# Patient Record
Sex: Male | Born: 1970
Health system: Southern US, Community
[De-identification: ages and names within clinical notes are randomized; demographics above are authoritative.]

## PROBLEM LIST (undated history)

## (undated) DIAGNOSIS — G4730 Sleep apnea, unspecified: Secondary | ICD-10-CM

## (undated) DIAGNOSIS — K279 Peptic ulcer, site unspecified, unspecified as acute or chronic, without hemorrhage or perforation: Secondary | ICD-10-CM

## (undated) DIAGNOSIS — I10 Essential (primary) hypertension: Secondary | ICD-10-CM

## (undated) DIAGNOSIS — E785 Hyperlipidemia, unspecified: Secondary | ICD-10-CM

## (undated) DIAGNOSIS — K469 Unspecified abdominal hernia without obstruction or gangrene: Secondary | ICD-10-CM

## (undated) HISTORY — DX: Hyperlipidemia, unspecified: E78.5

## (undated) HISTORY — DX: Essential (primary) hypertension: I10

## (undated) HISTORY — PX: OTHER SURGICAL HISTORY: SHX169

## (undated) HISTORY — DX: Sleep apnea, unspecified: G47.30

## (undated) HISTORY — DX: Peptic ulcer, site unspecified, unspecified as acute or chronic, without hemorrhage or perforation: K27.9

---

## 2008-02-18 ENCOUNTER — Emergency Department (HOSPITAL_COMMUNITY): Admission: EM | Admit: 2008-02-18 | Discharge: 2008-02-18 | Payer: Self-pay | Admitting: Emergency Medicine

## 2014-11-08 ENCOUNTER — Emergency Department (HOSPITAL_COMMUNITY)
Admission: EM | Admit: 2014-11-08 | Discharge: 2014-11-08 | Disposition: A | Discharge status: Home / Self Care | Payer: Self-pay | Attending: Emergency Medicine | Admitting: Emergency Medicine

## 2014-11-08 ENCOUNTER — Emergency Department (HOSPITAL_COMMUNITY): Payer: Self-pay

## 2014-11-08 ENCOUNTER — Encounter (HOSPITAL_COMMUNITY): Payer: Self-pay | Admitting: *Deleted

## 2014-11-08 DIAGNOSIS — R519 Headache, unspecified: Secondary | ICD-10-CM

## 2014-11-08 DIAGNOSIS — H539 Unspecified visual disturbance: Secondary | ICD-10-CM | POA: Insufficient documentation

## 2014-11-08 DIAGNOSIS — Z720 Tobacco use: Secondary | ICD-10-CM | POA: Insufficient documentation

## 2014-11-08 DIAGNOSIS — R51 Headache: Secondary | ICD-10-CM | POA: Insufficient documentation

## 2014-11-08 HISTORY — DX: Unspecified abdominal hernia without obstruction or gangrene: K46.9

## 2014-11-08 LAB — COMPREHENSIVE METABOLIC PANEL
ALBUMIN: 3.6 g/dL (ref 3.5–5.0)
ALK PHOS: 60 U/L (ref 38–126)
ALT: 29 U/L (ref 17–63)
AST: 23 U/L (ref 15–41)
Anion gap: 6 (ref 5–15)
BUN: 13 mg/dL (ref 6–20)
CALCIUM: 9.5 mg/dL (ref 8.9–10.3)
CHLORIDE: 105 mmol/L (ref 101–111)
CO2: 28 mmol/L (ref 22–32)
CREATININE: 0.99 mg/dL (ref 0.61–1.24)
GFR calc Af Amer: >60 mL/min (ref >60)
GFR calc non Af Amer: >60 mL/min (ref >60)
GLUCOSE: 104 mg/dL — AB (ref 65–99)
Potassium: 4.4 mmol/L (ref 3.5–5.1)
SODIUM: 139 mmol/L (ref 135–145)
Total Bilirubin: 0.4 mg/dL (ref 0.3–1.2)
Total Protein: 6.3 g/dL — ABNORMAL LOW (ref 6.5–8.1)

## 2014-11-08 LAB — CBC
HCT: 44.7 % (ref 39.0–52.0)
Hemoglobin: 14.9 g/dL (ref 13.0–17.0)
MCH: 32.3 pg (ref 26.0–34.0)
MCHC: 33.3 g/dL (ref 30.0–36.0)
MCV: 96.8 fL (ref 78.0–100.0)
PLATELETS: 162 10*3/uL (ref 150–400)
RBC: 4.62 MIL/uL (ref 4.22–5.81)
RDW: 12.4 % (ref 11.5–15.5)
WBC: 8.9 10*3/uL (ref 4.0–10.5)

## 2014-11-08 LAB — DIFFERENTIAL
BASOS ABS: 0 10*3/uL (ref 0.0–0.1)
BASOS PCT: 0 % (ref 0–1)
Eosinophils Absolute: 0.1 10*3/uL (ref 0.0–0.7)
Eosinophils Relative: 1 % (ref 0–5)
LYMPHS PCT: 31 % (ref 12–46)
Lymphs Abs: 2.8 10*3/uL (ref 0.7–4.0)
Monocytes Absolute: 0.7 10*3/uL (ref 0.1–1.0)
Monocytes Relative: 8 % (ref 3–12)
NEUTROS ABS: 5.3 10*3/uL (ref 1.7–7.7)
Neutrophils Relative %: 60 % (ref 43–77)

## 2014-11-08 LAB — I-STAT TROPONIN, ED: Troponin i, poc: 0.01 ng/mL (ref 0.00–0.08)

## 2014-11-08 LAB — APTT: APTT: 33 s (ref 24–37)

## 2014-11-08 LAB — PROTIME-INR
INR: 0.97 (ref 0.00–1.49)
PROTHROMBIN TIME: 13.1 s (ref 11.6–15.2)

## 2014-11-08 MED ORDER — DIPHENHYDRAMINE HCL 50 MG/ML IJ SOLN
25.0000 mg | Freq: Once | INTRAMUSCULAR | Status: AC
  Administered 2014-11-08: 25 mg via INTRAVENOUS
  Filled 2014-11-08: qty 1

## 2014-11-08 MED ORDER — METOCLOPRAMIDE HCL 5 MG/ML IJ SOLN
10.0000 mg | Freq: Once | INTRAMUSCULAR | Status: AC
  Administered 2014-11-08: 10 mg via INTRAVENOUS
  Filled 2014-11-08: qty 2

## 2014-11-08 MED ORDER — SODIUM CHLORIDE 0.9 % IV BOLUS (SEPSIS)
1000.0000 mL | Freq: Once | INTRAVENOUS | Status: AC
  Administered 2014-11-08: 1000 mL via INTRAVENOUS

## 2014-11-08 MED ORDER — TETRACAINE HCL 0.5 % OP SOLN
1.0000 [drp] | Freq: Once | OPHTHALMIC | Status: AC
  Administered 2014-11-08: 1 [drp] via OPHTHALMIC
  Filled 2014-11-08: qty 2

## 2014-11-08 NOTE — ED Notes (Signed)
Pt was eating and then onset of pain and "sensation something popped" behind his right eye. Hx of recent headaches. Having mild nausea. Pupils equal and reactive, grips equal, no neuro deficits noted.

## 2014-11-08 NOTE — Discharge Instructions (Signed)
Migraine Headache °A migraine headache is an intense, throbbing pain on one or both sides of your head. A migraine can last for 30 minutes to several hours. °CAUSES  °The exact cause of a migraine headache is not always known. However, a migraine may be caused when nerves in the brain become irritated and release chemicals that cause inflammation. This causes pain. °Certain things may also trigger migraines, such as: °· Alcohol. °· Smoking. °· Stress. °· Menstruation. °· Aged cheeses. °· Foods or drinks that contain nitrates, glutamate, aspartame, or tyramine. °· Lack of sleep. °· Chocolate. °· Caffeine. °· Hunger. °· Physical exertion. °· Fatigue. °· Medicines used to treat chest pain (nitroglycerine), birth control pills, estrogen, and some blood pressure medicines. °SIGNS AND SYMPTOMS °· Pain on one or both sides of your head. °· Pulsating or throbbing pain. °· Severe pain that prevents daily activities. °· Pain that is aggravated by any physical activity. °· Nausea, vomiting, or both. °· Dizziness. °· Pain with exposure to bright lights, loud noises, or activity. °· General sensitivity to bright lights, loud noises, or smells. °Before you get a migraine, you may get warning signs that a migraine is coming (aura). An aura may include: °· Seeing flashing lights. °· Seeing bright spots, halos, or zigzag lines. °· Having tunnel vision or blurred vision. °· Having feelings of numbness or tingling. °· Having trouble talking. °· Having muscle weakness. °DIAGNOSIS  °A migraine headache is often diagnosed based on: °· Symptoms. °· Physical exam. °· A CT scan or MRI of your head. These imaging tests cannot diagnose migraines, but they can help rule out other causes of headaches. °TREATMENT °Medicines may be given for pain and nausea. Medicines can also be given to help prevent recurrent migraines.  °HOME CARE INSTRUCTIONS °· Only take over-the-counter or prescription medicines for pain or discomfort as directed by your  health care provider. The use of long-term narcotics is not recommended. °· Lie down in a dark, quiet room when you have a migraine. °· Keep a journal to find out what may trigger your migraine headaches. For example, write down: °· What you eat and drink. °· How much sleep you get. °· Any change to your diet or medicines. °· Limit alcohol consumption. °· Quit smoking if you smoke. °· Get 7-9 hours of sleep, or as recommended by your health care provider. °· Limit stress. °· Keep lights dim if bright lights bother you and make your migraines worse. °SEEK IMMEDIATE MEDICAL CARE IF:  °· Your migraine becomes severe. °· You have a fever. °· You have a stiff neck. °· You have vision loss. °· You have muscular weakness or loss of muscle control. °· You start losing your balance or have trouble walking. °· You feel faint or pass out. °· You have severe symptoms that are different from your first symptoms. °MAKE SURE YOU:  °· Understand these instructions. °· Will watch your condition. °· Will get help right away if you are not doing well or get worse. °Document Released: 02/14/2005 Document Revised: 07/01/2013 Document Reviewed: 10/22/2012 °ExitCare® Patient Information ©2015 [employer]. This information is not intended to replace advice given to you by your health care provider. Make sure you discuss any questions you have with your health care provider. ° °General Headache Without Cause °A headache is pain or discomfort felt around the head or neck area. The specific cause of a headache may not be found. There are many causes and types of headaches. A few common ones   Tension headaches. Migraine headaches. Cluster headaches. Chronic daily headaches. HOME CARE INSTRUCTIONS  Keep all follow-up appointments with your caregiver or any specialist referral. Only take over-the-counter or prescription medicines for pain or discomfort as directed by your caregiver. Lie down in a dark, quiet room when you have a  headache. Keep a headache journal to find out what may trigger your migraine headaches. For example, write down: What you eat and drink. How much sleep you get. Any change to your diet or medicines. Try massage or other relaxation techniques. Put ice packs or heat on the head and neck. Use these 3 to 4 times per day for 15 to 20 minutes each time, or as needed. Limit stress. Sit up straight, and do not tense your muscles. Quit smoking if you smoke. Limit alcohol use. Decrease the amount of caffeine you drink, or stop drinking caffeine. Eat and sleep on a regular schedule. Get 7 to 9 hours of sleep, or as recommended by your caregiver. Keep lights dim if bright lights bother you and make your headaches worse. SEEK MEDICAL CARE IF:  You have problems with the medicines you were prescribed. Your medicines are not working. You have a change from the usual headache. You have nausea or vomiting. SEEK IMMEDIATE MEDICAL CARE IF:  Your headache becomes severe. You have a fever. You have a stiff neck. You have loss of vision. You have muscular weakness or loss of muscle control. You start losing your balance or have trouble walking. You feel faint or pass out. You have severe symptoms that are different from your first symptoms. MAKE SURE YOU:  Understand these instructions. Will watch your condition. Will get help right away if you are not doing well or get worse. Document Released: 02/14/2005 Document Revised: 05/09/2011 Document Reviewed: 03/02/2011 [facility] Patient Information 2015 [place], [place]. This information is not intended to replace advice given to you by your health care provider. Make sure you discuss any questions you have with your health care provider.

## 2014-11-08 NOTE — ED Provider Notes (Signed)
CSN: [id number]     Arrival date & time 11/08/14  1345 History   First MD Initiated Contact with Patient 11/08/14 1503     Chief Complaint  Patient presents with  . Headache   Omar Ward is a 44 y.o. male with a history of tobacco abuse who presents to the ED complaining of a sudden onset right sided headache today around 1 hour PTA. The patient reports he was sitting down to eat lunch when he had a sudden onset right sided headache that he rates at a 8/10 and describes as "Solid." He reports his pain is worse behind his eye and that his eye and the whole right side of his head hurts. He complains of worsening blurry vision since the onset of the headache. He reports feeling generally fatigued. He has taken nothing for treatment today. He denies history of migraines. He denies frequent headaches recently. He denies fevers, chest pain, shortness of breath, palpitations, numbness, tingling, weakness, no abdominal pain, nausea, vomiting, neck pain, neck stiffness, ear pain, sore throat or rashes.  (Consider location/radiation/quality/duration/timing/severity/associated sxs/prior Treatment) HPI  Past Medical History  Diagnosis Date  . Hernia, abdominal    History reviewed. No pertinent past surgical history. History reviewed. No pertinent family history. Social History  Substance Use Topics  . Smoking status: Current Every Day Smoker    Types: Cigarettes  . Smokeless tobacco: None  . Alcohol Use: No    Review of Systems  Constitutional: Negative for fever.  HENT: Negative for congestion, ear pain, facial swelling and sore throat.   Eyes: Positive for pain and visual disturbance. Negative for photophobia, discharge and redness.  Respiratory: Negative for cough, shortness of breath and wheezing.   Cardiovascular: Negative for chest pain and palpitations.  Gastrointestinal: Negative for nausea, vomiting, abdominal pain and diarrhea.  Genitourinary: Negative for dysuria.  Musculoskeletal:  Negative for back pain, neck pain and neck stiffness.  Skin: Negative for rash.  Neurological: Positive for headaches. Negative for dizziness, syncope, facial asymmetry, speech difficulty, weakness, light-headedness and numbness.      Allergies  Codeine  Home Medications   Prior to Admission medications   Medication Sig Start Date End Date Taking? Authorizing Provider  acetaminophen-codeine (TYLENOL #3) 300-30 MG per tablet Take 1 tablet by mouth every 4 (four) hours as needed for moderate pain.   Yes Historical Provider, MD  Aspirin-Acetaminophen-Caffeine 260-130-16 MG TABS Take 1 packet by mouth daily as needed (for headache).   Yes Historical Provider, MD   BP 117/89 mmHg  Pulse 49  Temp(Src) 97.8 F (36.6 C) (Oral)  Resp 14  Ht  (1.753 m)  Wt 180 lb (81.647 kg)  BMI 26.57 kg/m2  SpO2 94% Physical Exam  Constitutional: He is oriented to person, place, and time. He appears well-developed and well-nourished. No distress.  Nontoxic appearing.  HENT:  Head: Normocephalic and atraumatic.  Mouth/Throat: Oropharynx is clear and moist.  Eyes: Conjunctivae and EOM are normal. Pupils are equal, round, and reactive to light. Right eye exhibits no discharge. Left eye exhibits no discharge.  Slit lamp exam:      The right eye shows no anterior chamber bulge.       The left eye shows no anterior chamber bulge.  Eyes feel soft bilaterally. Lids appear normal. No eye discharge. Vision is grossly intact.  Eyes anesthetized using tetracaine and pressures checked using Tono-Pen. Right pressures: 27, 31, 18 Left pressures: 27, 27, 25  Neck: Normal range of motion. Neck supple. No  JVD present. No tracheal deviation present.  Normal neck range of motion. No meningeal signs.  Cardiovascular: Normal rate, regular rhythm, normal heart sounds and intact distal pulses.  Exam reveals no gallop and no friction rub.   No murmur heard. Bilateral radial pulses are intact.   Pulmonary/Chest:  Effort normal and breath sounds normal. No respiratory distress. He has no wheezes. He has no rales.  Lungs are clear to auscultation bilaterally.  Abdominal: Soft. There is no tenderness.  Musculoskeletal: Normal range of motion. He exhibits no edema or tenderness.  The patient is able to ambulate without difficulty or assistance. No lower extremity edema or tenderness. He has 5 out of 5 strength in his bilateral upper and lower extremities.  Lymphadenopathy:    He has no cervical adenopathy.  Neurological: He is alert and oriented to person, place, and time. No cranial nerve deficit. Coordination normal.  The patient is alert and oriented 3. Cranial nerves are intact. Sensation intact his bilateral upper and lower extremities. EOMs are intact bilaterally. Vision is grossly intact. Speech is clear and coherent. Normal gait. Good and equal grip strength bilaterally. No pronator drift.  Skin: Skin is warm and dry. No rash noted. He is not diaphoretic. No erythema. No pallor.  Psychiatric: He has a normal mood and affect. His behavior is normal.  Nursing note and vitals reviewed.   ED Course  Procedures (including critical care time) Labs Review Labs Reviewed  COMPREHENSIVE METABOLIC PANEL - Abnormal; Notable for the following:    Glucose, Bld 104 (*)    Total Protein 6.3 (*)    All other components within normal limits  PROTIME-INR  APTT  CBC  DIFFERENTIAL  I-STAT TROPOININ, ED    Imaging Review Ct Head Wo Contrast  11/08/2014   CLINICAL DATA:  Sudden onset of headache behind RIGHT eye. Mild nausea.  EXAM: CT HEAD WITHOUT CONTRAST  TECHNIQUE: Contiguous axial images were obtained from the base of the skull through the vertex without intravenous contrast.  COMPARISON:  None.  FINDINGS: No evidence for acute infarction, hemorrhage, mass lesion, hydrocephalus, or extra-axial fluid. No atrophy or white matter disease. Intact calvarium. No acute sinus or mastoid disease.  IMPRESSION:  Negative.   Electronically Signed   By: Elsie Stain M.D.   On: 11/08/2014 14:53   I have personally reviewed and evaluated these images and lab results as part of my medical decision-making.   EKG Interpretation None      Filed Vitals:   11/08/14 1800 11/08/14 1900 11/08/14 1901 11/08/14 2100  BP: 145/94 145/98  117/89  Pulse: 65 64  49  Temp:   97.8 F (36.6 C)   TempSrc:      Resp: 16 12  14   Height:      Weight:      SpO2: 95% 97%  94%    MDM   Meds given in ED:  Medications  sodium chloride 0.9 % bolus 1,000 mL (0 mLs Intravenous Stopped 11/08/14 2105)  metoCLOPramide (REGLAN) injection 10 mg (10 mg Intravenous Given 11/08/14 1904)  diphenhydrAMINE (BENADRYL) injection 25 mg (25 mg Intravenous Given 11/08/14 1903)  tetracaine (PONTOCAINE) 0.5 % ophthalmic solution 1 drop (1 drop Both Eyes Given by Other 11/08/14 2104)    New Prescriptions   No medications on file    Final diagnoses:  Bad headache   This is a 44 y.o. male with a history of tobacco abuse who presents to the ED complaining of a sudden onset right sided  headache today around 1 hour PTA. The patient reports he was sitting down to eat lunch when he had a sudden onset right sided headache that he rates at a 8/10 and describes as "Solid." He reports his pain is worse behind his eye and that his eye and the whole right side of his head hurts.   CT head was unremarkable. CBC and CMP are unremarkable. Intraocular pressures were checked using Tono-Pen and were unremarkable and not concerning for glaucoma. Pt HA treated and improved while in ED with migraine cocktail. He reports his symptoms have completely resolved prior to discharge.  Presentation is not concerning for [facility], ICH, Meningitis, or temporal arteritis. Pt is afebrile with no focal neuro deficits, nuchal rigidity. Pt is to follow up with PCP and neurology to discuss prophylactic medication. I advised the patient to follow-up with their primary care provider  this week. I advised the patient to return to the emergency department with new or worsening symptoms or new concerns. The patient verbalized understanding and agreement with plan.       Everlene Farrier, PA-C 11/08/14 2154  Linwood Dibbles, MD 11/09/14 [id number]

## 2017-02-15 ENCOUNTER — Other Ambulatory Visit: Payer: Self-pay

## 2017-02-15 ENCOUNTER — Encounter: Payer: Self-pay | Admitting: Family Medicine

## 2017-02-15 ENCOUNTER — Ambulatory Visit (INDEPENDENT_AMBULATORY_CARE_PROVIDER_SITE_OTHER): Payer: 59 | Admitting: Family Medicine

## 2017-02-15 ENCOUNTER — Ambulatory Visit: Payer: Self-pay | Admitting: Family Medicine

## 2017-02-15 DIAGNOSIS — F17200 Nicotine dependence, unspecified, uncomplicated: Secondary | ICD-10-CM

## 2017-02-15 DIAGNOSIS — Z13220 Encounter for screening for lipoid disorders: Secondary | ICD-10-CM | POA: Diagnosis not present

## 2017-02-15 DIAGNOSIS — I10 Essential (primary) hypertension: Secondary | ICD-10-CM | POA: Diagnosis not present

## 2017-02-15 MED ORDER — AMLODIPINE BESYLATE 10 MG PO TABS: 10.0000 mg | ORAL_TABLET | Freq: Every day | ORAL | 0 refills | Status: DC

## 2017-02-15 MED FILL — AMLODIPINE BESYLATE 10 MG T: 10 | 90 days supply | Qty: 90 | Fill #0

## 2017-02-15 NOTE — Assessment & Plan Note (Signed)
See after visit summary. 

## 2017-02-15 NOTE — Patient Instructions (Addendum)
Good to see you today!  Thanks for coming in.  Keep taking amlodipine 10 mg every day.  If you have lightheadness or feel dizzy then call us  Monitor your blood pressure aim is for  < 140/90 best is 130/80    I will call you if your tests are not good.  Otherwise I will send you a letter.  If you do not hear from me with in 2 weeks please call our office.     Homework  Make an appointment to see an eye doctor  Consider stopping smoking - substitute   Watch weight - cut back on unhealthy foods   Come back in 3 months for a blood pressure and weight check

## 2017-02-15 NOTE — Progress Notes (Signed)
hSubjective  Here to establish care.  Generally feeling well  Diagnosed with Hypertension at employment physical with [employer] last month.  Treated by practicioner in [place] started on Amlodipine 10 mg daily.  Has been feeling well recently.  No chest pain or shortness of breath or lightheadedness. Does have occsl headaches.  Also feels his vision is not as good as used to be over the last several years  Patient reports no hearing changes,anorexia, weight change, fever ,adenopathy, persistant / recurrent hoarseness, swallowing issues, chest pain, edema,persistant / recurrent cough, hemoptysis, dyspnea(rest, exertional, paroxysmal nocturnal), gastrointestinal  bleeding (melena, rectal bleeding), abdominal pain, excessive heart burn, GU symptoms(dysuria, hematuria, pyuria, voiding/incontinence  Issues) syncope, focal weakness, severe memory loss, concerning skin lesions, depression, anxiety, abnormal bruising/bleeding, major joint swelling.    PMFSH - see relevent areas in Epic    Objective Vital Signs reviewed Ears:  External ear exam shows no significant lesions or deformities.  Otoscopic examination reveals clear canals, tympanic membranes are intact bilaterally without bulging, retraction, inflammation or discharge. Hearing is grossly normal bilaterall Neck:  No deformities, thyromegaly, masses, or tenderness noted.   Supple with full range of motion without pain. Skin:  Intact without suspicious lesions or rashes Heart - Regular rate and rhythm.  No murmurs, gallops or rubs.    Lungs:  Normal respiratory effort, chest expands symmetrically. Lungs are clear to auscultation, no crackles or wheezes. Abdomen: soft and non-tender without masses, organomegaly or hernias noted.  No guarding or rebound Extremities:  No cyanosis, edema, or deformity noted with good range of motion of all major joints.   Mouth - no lesions, mucous membranes are moist, no decaying teeth   Eye - Pupils Equal Round Reactive  to light, Extraocular movements intact,  Conjunctiva without redness or discharge     Assessments/Plans  Tobacco use disorder See after visit summary   Essential hypertension At goal continue current medication - check labs   Normal Physical - see after visit summary

## 2017-02-15 NOTE — Assessment & Plan Note (Signed)
At goal continue current medication - check labs

## 2017-02-16 ENCOUNTER — Encounter: Payer: Self-pay | Admitting: Family Medicine

## 2017-02-16 LAB — LIPID PANEL
CHOL/HDL RATIO: 5.7 ratio — AB (ref 0.0–5.0)
Cholesterol, Total: 222 mg/dL — ABNORMAL HIGH (ref 100–199)
HDL: 39 mg/dL — AB (ref >39)
TRIGLYCERIDES: 447 mg/dL — AB (ref 0–149)

## 2017-02-16 LAB — CMP14+EGFR
ALK PHOS: 83 IU/L (ref 39–117)
ALT: 24 IU/L (ref 0–44)
AST: 24 IU/L (ref 0–40)
Albumin/Globulin Ratio: 2 (ref 1.2–2.2)
Albumin: 4.5 g/dL (ref 3.5–5.5)
BUN / CREAT RATIO: 12 (ref 9–20)
BUN: 13 mg/dL (ref 6–24)
CHLORIDE: 100 mmol/L (ref 96–106)
CO2: 24 mmol/L (ref 20–29)
Calcium: 9.5 mg/dL (ref 8.7–10.2)
Creatinine, Ser: 1.05 mg/dL (ref 0.76–1.27)
GFR calc Af Amer: 97 mL/min/{1.73_m2} (ref >59)
GFR calc non Af Amer: 84 mL/min/{1.73_m2} (ref >59)
GLUCOSE: 87 mg/dL (ref 65–99)
Globulin, Total: 2.2 g/dL (ref 1.5–4.5)
Potassium: 4.5 mmol/L (ref 3.5–5.2)
Sodium: 140 mmol/L (ref 134–144)
Total Protein: 6.7 g/dL (ref 6.0–8.5)

## 2017-02-16 LAB — CBC
HEMATOCRIT: 41.4 % (ref 37.5–51.0)
HEMOGLOBIN: 14 g/dL (ref 13.0–17.7)
MCH: 32.9 pg (ref 26.6–33.0)
MCHC: 33.8 g/dL (ref 31.5–35.7)
MCV: 97 fL (ref 79–97)
Platelets: 203 10*3/uL (ref 150–379)
RBC: 4.26 x10E6/uL (ref 4.14–5.80)
RDW: 13.2 % (ref 12.3–15.4)
WBC: 8.3 10*3/uL (ref 3.4–10.8)

## 2017-06-28 ENCOUNTER — Ambulatory Visit (INDEPENDENT_AMBULATORY_CARE_PROVIDER_SITE_OTHER): Payer: 59 | Admitting: Family Medicine

## 2017-06-28 ENCOUNTER — Encounter: Payer: Self-pay | Admitting: Family Medicine

## 2017-06-28 DIAGNOSIS — F17200 Nicotine dependence, unspecified, uncomplicated: Secondary | ICD-10-CM | POA: Diagnosis not present

## 2017-06-28 DIAGNOSIS — I10 Essential (primary) hypertension: Secondary | ICD-10-CM | POA: Diagnosis not present

## 2017-06-28 MED ORDER — AMLODIPINE BESYLATE 10 MG PO TABS: 10.0000 mg | ORAL_TABLET | Freq: Every day | ORAL | 1 refills | Status: DC

## 2017-06-28 MED FILL — AMLODIPINE BESYLATE 10 MG T: 10 | 90 days supply | Qty: 90 | Fill #0

## 2017-06-28 NOTE — Progress Notes (Signed)
Subjective  Omar Ward is a 47 y.o. male is presenting with the following  HYPERTENSION Disease Monitoring  Home BP Monitoring (Severity) Was 180/110 yesterday has been off medications for 4 days ran out Symptoms - Chest pain- no    Dyspnea- no Medications (Modifying factors) Compliance-  Was daily till ran out. Lightheadedness-  mild  Edema- some at the end of the day.  No better when stopped the amlodipine Timing - continuous  Duration - years ROS - See HPI  PMH Lab Review   Potassium  Date Value Ref Range Status  02/15/2017 4.5 3.5 - 5.2 mmol/L Final   Sodium  Date Value Ref Range Status  02/15/2017 140 134 - 144 mmol/L Final   Creatinine, Ser  Date Value Ref Range Status  02/15/2017 1.05 0.76 - 1.27 mg/dL Final       TOBACCO Has cut down to 1/2 ppd.  Aim is to stop completely but no quit date.  Lots of stress at home taking care of mother and aunt and working 3rd shift   Chief Complaint noted Review of Symptoms - see HPI PMH - Smoking status noted.    FH - Mom has diabetes  Objective Vital Signs reviewed BP 140/80 (BP Location: Right Arm, Patient Position: Sitting, Cuff Size: Normal)   Pulse 84   Temp 99.2 F (37.3 C) (Oral)   Ht  (1.753 m)   Wt 218 lb 9.6 oz (99.2 kg)   SpO2 97%   BMI 32.28 kg/m  Heart - Regular rate and rhythm.  No murmurs, gallops or rubs.    Lungs:  Normal respiratory effort, chest expands symmetrically. Lungs are clear to auscultation, no crackles or wheezes. Extrem - trace edema at ankles (worked last PM)  Assessments/Plans  See after visit summary for details of patient instuctions  Tobacco use disorder Improved despite ongoing stressors   Essential hypertension Not at goal due to not taking medication.  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

## 2017-06-28 NOTE — Assessment & Plan Note (Signed)
Improved despite ongoing stressors

## 2017-06-28 NOTE — Assessment & Plan Note (Signed)
Not at goal due to not taking medication.  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

## 2017-06-28 NOTE — Patient Instructions (Signed)
Good to see you today!  Thanks for coming in.  Your blood pressure should be below 140/90 goal is 135/80  Take the amlodipine 10 mg every day.  You should feel better in 1-2 days and your blood pressure should be better if it does not let me know.  Congrats on the weight loss and the smoking cessation  Come back in 6 weeks for blood pressure check

## 2017-10-23 MED FILL — AMLODIPINE BESYLATE 10 MG T: 10 | 90 days supply | Qty: 90 | Fill #1

## 2018-02-19 ENCOUNTER — Other Ambulatory Visit: Payer: Self-pay | Admitting: Family Medicine

## 2018-02-19 DIAGNOSIS — I10 Essential (primary) hypertension: Secondary | ICD-10-CM

## 2018-02-19 MED FILL — AMLODIPINE BESYLATE 10 MG T: 10 | 90 days supply | Qty: 90 | Fill #0

## 2018-02-27 DIAGNOSIS — M25561 Pain in right knee: Secondary | ICD-10-CM | POA: Diagnosis not present

## 2018-02-27 MED FILL — MELOXICAM 15 MG TABLET: 15 | 30 days supply | Qty: 30 | Fill #0

## 2018-04-03 ENCOUNTER — Other Ambulatory Visit: Payer: Self-pay

## 2018-04-03 ENCOUNTER — Encounter: Payer: Self-pay | Admitting: Family Medicine

## 2018-04-03 ENCOUNTER — Ambulatory Visit (INDEPENDENT_AMBULATORY_CARE_PROVIDER_SITE_OTHER): Payer: 59 | Admitting: Family Medicine

## 2018-04-03 DIAGNOSIS — I10 Essential (primary) hypertension: Secondary | ICD-10-CM

## 2018-04-03 DIAGNOSIS — F17200 Nicotine dependence, unspecified, uncomplicated: Secondary | ICD-10-CM

## 2018-04-03 DIAGNOSIS — L237 Allergic contact dermatitis due to plants, except food: Secondary | ICD-10-CM | POA: Diagnosis not present

## 2018-04-03 MED ORDER — PREDNISONE 10 MG PO TABS: ORAL_TABLET | ORAL | 0 refills | Status: DC

## 2018-04-03 MED ORDER — TRIAMCINOLONE ACETONIDE 0.5 % EX OINT: 1.0000 "application " | TOPICAL_OINTMENT | Freq: Two times a day (BID) | CUTANEOUS | 2 refills | Status: DC

## 2018-04-03 MED FILL — MELOXICAM 15 MG TABLET: 15 | 30 days supply | Qty: 30 | Fill #1

## 2018-04-03 MED FILL — TRIAMCINOLONE 0.5% OINTMENT: 0.5 | 30 days supply | Qty: 30 | Fill #0

## 2018-04-03 MED FILL — predniSONE 10 MG TABS: 10 | 12 days supply | Qty: 60 | Fill #0

## 2018-04-03 NOTE — Assessment & Plan Note (Signed)
Typical contact rash - topical and oral steroids

## 2018-04-03 NOTE — Patient Instructions (Signed)
Good to see you today!  Thanks for coming in.  Take the prednisone until gone.  If the rash comes back then call me Use the ointment twice a day  Come in for blood tests - nothing but water for 8 hours before  I will call you if your tests are not good.  Otherwise I will send you a letter.  If you do not hear from me with in 2 weeks please call our office.     Consider following up for smoking cessation  Contact [employer] employee classes  Consider regular sleep schedule

## 2018-04-03 NOTE — Assessment & Plan Note (Signed)
Discussed approaches.  See after visit summary

## 2018-04-03 NOTE — Progress Notes (Signed)
Subjective  TAURINO FIGEROA is a 48 y.o. male is presenting with the following  HYPERTENSION Disease Monitoring  Home BP Monitoring (Severity) not checking Symptoms - Chest pain- no    Dyspnea- no Medications (Modifying factors) Compliance-  Daily amlodipine. Lightheadedness-  no  Edema- no Timing - continuous  Duration - years ROS - See HPI  TOBACCO USE Severity Smokes about 1/2 ppd Modifying Factors - reasons they smoke - habit stress Timing - when do they smoke various times Duration - years Symptoms - Shortness of breath - not severe     Cough - occasional   RASH Has recurrent PI and was exposed moving wood recently.  Steroids usually help.  No shortness of breath or oral lesions   PMH Lab Review   Potassium  Date Value Ref Range Status  02/15/2017 4.5 3.5 - 5.2 mmol/L Final   Sodium  Date Value Ref Range Status  02/15/2017 140 134 - 144 mmol/L Final   Creatinine, Ser  Date Value Ref Range Status  02/15/2017 1.05 0.76 - 1.27 mg/dL Final         Chief Complaint noted Review of Symptoms - see HPI PMH - Smoking status noted.    Objective Vital Signs reviewed Pulse 61   Temp 98.7 F (37.1 C) (Oral)   Ht 5\' 9"  (1.753 m)   Wt 224 lb 12.8 oz (102 kg)   SpO2 96%   BMI 33.20 kg/m  Psych:  Cognition and judgment appear intact. Alert, communicative  and cooperative with normal attention span and concentration. No apparent delusions, illusions, hallucinations Skin - typical diffuse vesicular rash in neck and wrists  Assessments/Plans  See after visit summary for details of patient instuctions  Poison ivy Typical contact rash - topical and oral steroids   Essential hypertension Well controlled.  Check labs   Tobacco use disorder Discussed approaches.  See after visit summary

## 2018-04-03 NOTE — Assessment & Plan Note (Signed)
Well controlled.  Check labs  

## 2018-06-07 ENCOUNTER — Other Ambulatory Visit: Payer: Self-pay | Admitting: Family Medicine

## 2018-06-07 DIAGNOSIS — I10 Essential (primary) hypertension: Secondary | ICD-10-CM

## 2018-06-07 MED FILL — AMLODIPINE BESYLATE 10 MG T: 10 | 90 days supply | Qty: 90 | Fill #0

## 2018-10-09 ENCOUNTER — Other Ambulatory Visit: Payer: Self-pay | Admitting: Family Medicine

## 2018-10-09 ENCOUNTER — Encounter: Payer: Self-pay | Admitting: Family Medicine

## 2018-10-09 ENCOUNTER — Ambulatory Visit: Payer: 59 | Admitting: Family Medicine

## 2018-10-09 ENCOUNTER — Other Ambulatory Visit: Payer: Self-pay

## 2018-10-09 DIAGNOSIS — G4733 Obstructive sleep apnea (adult) (pediatric): Secondary | ICD-10-CM | POA: Insufficient documentation

## 2018-10-09 DIAGNOSIS — I10 Essential (primary) hypertension: Secondary | ICD-10-CM

## 2018-10-09 DIAGNOSIS — Z9989 Dependence on other enabling machines and devices: Secondary | ICD-10-CM | POA: Insufficient documentation

## 2018-10-09 DIAGNOSIS — R0683 Snoring: Secondary | ICD-10-CM

## 2018-10-09 DIAGNOSIS — F339 Major depressive disorder, recurrent, unspecified: Secondary | ICD-10-CM | POA: Diagnosis not present

## 2018-10-09 DIAGNOSIS — E785 Hyperlipidemia, unspecified: Secondary | ICD-10-CM | POA: Insufficient documentation

## 2018-10-09 DIAGNOSIS — G4730 Sleep apnea, unspecified: Secondary | ICD-10-CM | POA: Insufficient documentation

## 2018-10-09 MED ORDER — AMLODIPINE BESYLATE 10 MG PO TABS: 10.0000 mg | ORAL_TABLET | Freq: Every day | ORAL | 3 refills | Status: DC

## 2018-10-09 MED ORDER — TRAZODONE HCL 50 MG PO TABS: 25.0000 mg | ORAL_TABLET | Freq: Every evening | ORAL | 3 refills | Status: DC | PRN

## 2018-10-09 MED ORDER — SERTRALINE HCL 50 MG PO TABS: 50.0000 mg | ORAL_TABLET | Freq: Every day | ORAL | 3 refills | Status: DC

## 2018-10-09 MED FILL — AMLODIPINE BESYLATE 10 MG T: 10 | 90 days supply | Qty: 90 | Fill #0

## 2018-10-09 MED FILL — traZODone HCL 50 MG TABS: 50 | 30 days supply | Qty: 30 | Fill #0

## 2018-10-09 MED FILL — SERTRALINE HCL 50 MG TABLET: 50 | 30 days supply | Qty: 30 | Fill #0

## 2018-10-09 NOTE — Assessment & Plan Note (Signed)
Not well controlled.  Start zoloft and trazadone.  Monitor closely

## 2018-10-09 NOTE — Progress Notes (Signed)
Subjective  Omar Ward is a 48 y.o. male is presenting with the following  DEPRESSION Feeling down and listless.  Very poor sleep.  Works third shift.  Would like to sleep from 11 AM to 5 PM but often does not sleep more than 1-2 hours.  Does snore a lot.  Has taken zoloft before for depression and this helped.  Used to self medicate years ago currently has rare alcohol drink but that is all.   Has never tried trazadone.  No suicidal ideation   HYPERTENSION Disease Monitoring  Home BP Monitoring (Severity) not checking Symptoms - Chest pain- no    Dyspnea- no Medications (Modifying factors) Compliance-  Takes amlodipine. Lightheadedness-  no  Edema- no Timing - continuous  Duration - years ROS - See HPI  PMH Lab Review   Potassium  Date Value Ref Range Status  02/15/2017 4.5 3.5 - 5.2 mmol/L Final   Sodium  Date Value Ref Range Status  02/15/2017 140 134 - 144 mmol/L Final   Creatinine, Ser  Date Value Ref Range Status  02/15/2017 1.05 0.76 - 1.27 mg/dL Final         Chief Complaint noted Review of Symptoms - see HPI PMH - Smoking status noted.    Objective Vital Signs reviewed BP (!) 144/80   Pulse 71   Wt 237 lb 6.4 oz (107.7 kg)   SpO2 98%   BMI 35.06 kg/m  Psych:  Cognition and judgment appear intact. Alert, communicative  and cooperative with normal attention span and concentration. No apparent delusions, illusions, hallucinations PHQ9 -19 Assessments/Plans  See after visit summary for details of patient instuctions  Depression, recurrent ([place]) Not well controlled.  Start zoloft and trazadone.  Monitor closely   Snoring Describes significant snoring and wife says stops breathing sometime.   Worse with weight gain.  Will refer for sleep study   Essential hypertension BP Readings from Last 3 Encounters:  10/09/18 (!) 144/80  06/28/17 140/80  02/15/17 118/72   Elevated mildly today.  Will follow. Check labs

## 2018-10-09 NOTE — Assessment & Plan Note (Signed)
BP Readings from Last 3 Encounters:  10/09/18 (!) 144/80  06/28/17 140/80  02/15/17 118/72   Elevated mildly today.  Will follow. Check labs

## 2018-10-09 NOTE — Assessment & Plan Note (Signed)
Describes significant snoring and wife says stops breathing sometime.   Worse with weight gain.  Will refer for sleep study

## 2018-10-09 NOTE — Patient Instructions (Addendum)
Good to see you today!  Thanks for coming in.  For your Mood Zoloft take 1/2 tab when you wake up for 3 days then take a whole tab. Trazadone take 1/2 tab to one tab about an hour before you want to go to sleep around 10 AM  Try to get up and go to sleep the same time every day  Come back for fasting labs in one week - call the day before  Call me if you are feeling worse   Come back in 3-4 weeks to check   I will put in a sleep study order

## 2018-10-09 NOTE — Telephone Encounter (Signed)
Pt wanted to see if he could get a refill on his amLODiopine. Please give pt a call.

## 2018-10-22 ENCOUNTER — Encounter: Payer: Self-pay | Admitting: Neurology

## 2018-10-22 ENCOUNTER — Institutional Professional Consult (permissible substitution): Payer: 59 | Admitting: Neurology

## 2018-10-31 ENCOUNTER — Ambulatory Visit (INDEPENDENT_AMBULATORY_CARE_PROVIDER_SITE_OTHER): Payer: 59 | Admitting: Family Medicine

## 2018-10-31 ENCOUNTER — Other Ambulatory Visit: Payer: Self-pay

## 2018-10-31 VITALS — BP 145/80 | HR 76 | Ht 69.0 in | Wt 233.4 lb

## 2018-10-31 DIAGNOSIS — Z114 Encounter for screening for human immunodeficiency virus [HIV]: Secondary | ICD-10-CM | POA: Diagnosis not present

## 2018-10-31 DIAGNOSIS — Z23 Encounter for immunization: Secondary | ICD-10-CM | POA: Diagnosis not present

## 2018-10-31 DIAGNOSIS — I10 Essential (primary) hypertension: Secondary | ICD-10-CM

## 2018-10-31 DIAGNOSIS — F339 Major depressive disorder, recurrent, unspecified: Secondary | ICD-10-CM

## 2018-10-31 MED ORDER — SERTRALINE HCL 100 MG PO TABS: 100.0000 mg | ORAL_TABLET | Freq: Every day | ORAL | 1 refills | Status: DC

## 2018-10-31 MED FILL — SERTRALINE HCL 100 MG TAB: 100 | 90 days supply | Qty: 90 | Fill #0

## 2018-10-31 NOTE — Patient Instructions (Addendum)
Good to see you today!  Thanks for coming in.  Reschedule your sleep study  Go up to 2 tabs of zoloft a day  I will call you if your tests are not good.  Otherwise I will send you a letter.  If you do not hear from me with in 2 weeks please call our office.     Come back in 6 weeks to see how things are going

## 2018-10-31 NOTE — Assessment & Plan Note (Signed)
BP Readings from Last 3 Encounters:  10/31/18 (!) 145/80  10/09/18 (!) 144/80  06/28/17 140/80   Elevated midly today. Will continue to follow.   Hopefully if has sleep apnea and is treated will improve without adding medications

## 2018-10-31 NOTE — Assessment & Plan Note (Signed)
Improved but still symptomatic.  Trial of increased dose of sertraline

## 2018-10-31 NOTE — Progress Notes (Signed)
hivSubjective  Omar Ward is a 48 y.o. male is presenting with the following  DEPRESSION Improved.  Trazadone gave him headache but his sleep is improved with change in schedule as suggested.  Major symptom now is fatigue.  Eating better  INSOMNIA Improved.  Missed the sleep study due to relative emergency.  Will try to reschedule.  Frequent day time sleepiness but does not fall asleep  HYPERTENSION Disease Monitoring  Home BP Monitoring (Severity) not checking Symptoms - Chest pain- no    Dyspnea- no Medications (Modifying factors) Compliance-  Taking amlodipine regularly. Lightheadedness-  no  Edema- no Timing - continuous  Duration - years ROS - See HPI Has not followed up for labs  PMH Lab Review   Potassium  Date Value Ref Range Status  02/15/2017 4.5 3.5 - 5.2 mmol/L Final   Sodium  Date Value Ref Range Status  02/15/2017 140 134 - 144 mmol/L Final   Creatinine, Ser  Date Value Ref Range Status  02/15/2017 1.05 0.76 - 1.27 mg/dL Final        Social History   Social History Narrative   Lives with his second wife.  Has farm animals and likes to work on cars. Likes to fish and hunt.  No regular exercise.   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    Objective Vital Signs reviewed BP (!) 145/80   Pulse 76   Ht 5\' 9"  (1.753 m)   Wt 233 lb 6.4 oz (105.9 kg)   SpO2 98%   BMI 34.47 kg/m  Psych:  Cognition and judgment appear intact. Alert, communicative  and cooperative with normal attention span and concentration. No apparent delusions, illusions, hallucinations PHQ9 = 6  Assessments/Plans  See after visit summary for details of patient instuctions  Depression, recurrent ([place]) Improved but still symptomatic.  Trial of increased dose of sertraline  Essential hypertension BP Readings from Last 3 Encounters:  10/31/18 (!)  145/80  10/09/18 (!) 144/80  06/28/17 140/80   Elevated midly today. Will continue to follow.   Hopefully if has sleep apnea and is treated will improve without adding medications

## 2018-11-01 ENCOUNTER — Encounter: Payer: Self-pay | Admitting: Family Medicine

## 2018-11-01 LAB — CMP14+EGFR
ALT: 74 IU/L — ABNORMAL HIGH (ref 0–44)
AST: 41 IU/L — ABNORMAL HIGH (ref 0–40)
Albumin/Globulin Ratio: 1.7 (ref 1.2–2.2)
Albumin: 4.4 g/dL (ref 4.0–5.0)
Alkaline Phosphatase: 94 IU/L (ref 39–117)
BUN/Creatinine Ratio: 15 (ref 9–20)
BUN: 15 mg/dL (ref 6–24)
Bilirubin Total: <0.2 mg/dL (ref 0.0–1.2)
CO2: 20 mmol/L (ref 20–29)
Calcium: 9.5 mg/dL (ref 8.7–10.2)
Chloride: 101 mmol/L (ref 96–106)
Creatinine, Ser: 1.02 mg/dL (ref 0.76–1.27)
GFR calc Af Amer: 99 mL/min/{1.73_m2} (ref >59)
GFR calc non Af Amer: 86 mL/min/{1.73_m2} (ref >59)
Globulin, Total: 2.6 g/dL (ref 1.5–4.5)
Glucose: 136 mg/dL — ABNORMAL HIGH (ref 65–99)
Potassium: 4.1 mmol/L (ref 3.5–5.2)
Sodium: 138 mmol/L (ref 134–144)
Total Protein: 7 g/dL (ref 6.0–8.5)

## 2018-11-01 LAB — LIPID PANEL
Chol/HDL Ratio: 5.9 ratio — ABNORMAL HIGH (ref 0.0–5.0)
Cholesterol, Total: 255 mg/dL — ABNORMAL HIGH (ref 100–199)
HDL: 43 mg/dL (ref >39)
LDL Chol Calc (NIH): 150 mg/dL — ABNORMAL HIGH (ref 0–99)
Triglycerides: 332 mg/dL — ABNORMAL HIGH (ref 0–149)
VLDL Cholesterol Cal: 62 mg/dL — ABNORMAL HIGH (ref 5–40)

## 2018-11-01 LAB — TSH: TSH: 2.38 u[IU]/mL (ref 0.450–4.500)

## 2018-11-01 LAB — HIV ANTIBODY (ROUTINE TESTING W REFLEX): HIV Screen 4th Generation wRfx: NONREACTIVE

## 2018-11-12 ENCOUNTER — Ambulatory Visit: Payer: 59 | Admitting: Neurology

## 2018-11-12 ENCOUNTER — Encounter: Payer: Self-pay | Admitting: Neurology

## 2018-11-12 ENCOUNTER — Other Ambulatory Visit: Payer: Self-pay

## 2018-11-12 VITALS — BP 167/100 | HR 59 | Ht 69.0 in | Wt 230.0 lb

## 2018-11-12 DIAGNOSIS — J449 Chronic obstructive pulmonary disease, unspecified: Secondary | ICD-10-CM

## 2018-11-12 DIAGNOSIS — R030 Elevated blood-pressure reading, without diagnosis of hypertension: Secondary | ICD-10-CM

## 2018-11-12 DIAGNOSIS — G4761 Periodic limb movement disorder: Secondary | ICD-10-CM | POA: Diagnosis not present

## 2018-11-12 DIAGNOSIS — F17200 Nicotine dependence, unspecified, uncomplicated: Secondary | ICD-10-CM

## 2018-11-12 DIAGNOSIS — R0683 Snoring: Secondary | ICD-10-CM | POA: Diagnosis not present

## 2018-11-12 DIAGNOSIS — R0681 Apnea, not elsewhere classified: Secondary | ICD-10-CM | POA: Diagnosis not present

## 2018-11-12 DIAGNOSIS — G2581 Restless legs syndrome: Secondary | ICD-10-CM | POA: Diagnosis not present

## 2018-11-12 DIAGNOSIS — E669 Obesity, unspecified: Secondary | ICD-10-CM

## 2018-11-12 DIAGNOSIS — Z820 Family history of epilepsy and other diseases of the nervous system: Secondary | ICD-10-CM | POA: Diagnosis not present

## 2018-11-12 DIAGNOSIS — G4726 Circadian rhythm sleep disorder, shift work type: Secondary | ICD-10-CM

## 2018-11-12 NOTE — Progress Notes (Signed)
Subjective:    Patient ID: Omar Ward is a 48 y.o. male.  HPI     Omar FoleySaima Joel Ward, Omar Ward, Omar Ward [facility] [address], [address] [address] [place], [place] [id number]  Dear Dr. Deirdre Priesthambliss, I saw your patient, Omar Ward, upon your kind request in my sleep clinic today for initial consultation of his sleep disorder, in particular, concern for underlying obstructive sleep apnea.  The patient is unaccompanied today.  As you know, Omar Ward is a 48 year old right-hand gentleman with an underlying medical history of hypertension, depression, smoking, and obesity, who reports snoring and excessive daytime somnolence as well as difficulty initiating and maintaining sleep.  He works third shift, 12 hours, 5 days a week.  He works at [employer] [place], as a Curatormechanic.  He has been working third shift for the past 2 years.  Prior to that he worked in [employer].  He is trying to quit smoking and would like to try Chantix but recently started Zoloft for his anxiety and depression has been on this for the past 6 weeks or so.  He took care of his mother for about 4 years, she recently passed away.  He is holding up okay, he is also taking care of his elderly aunt.  He lives with his wife, he has 2 grown biological children and 2 stepchildren.  He smokes about a pack a day and drinks alcohol very occasionally, caffeine in the form of coffee, 2 cups/day on average.  His bedtime is around noon and rise time and.  [place] 1 [place]  He tries to sleep on his nights off at night.  He has 2 dogs in the household.  He snores loudly and also makes pauses in his breathing frequently as witnessed by his wife.  He also twitches quite a bit and kicks in his sleep as per wife's feedback and he also feels like he needs to move his legs all the time, endorses restless leg symptoms.  He has woken up with the occasional headache, seems more lately.   His blood pressure numbers have been difficult to control.  He also was told he has hyperlipidemia recently.  I reviewed your office note from 10/31/2018.  I reviewed his lipid panel from 10/31/2018: Total cholesterol was 255, LDL 150, triglycerides 332.  His Epworth sleepiness score is 5 out of 24, fatigue severity score is 41 out of 63.  He does not wake up rested, he goes to the bathroom about 3 times once he goes to bed.  He wakes up with a dry mouth often due to mouth breathing.  His mother had sleep apnea and also needed oxygen, she had COPD.  His brother has sleep apnea and uses a CPAP machine and his father is suspected to have sleep apnea as I understand.  His Past Medical History Is Significant For: Past Medical History:  Diagnosis Date  . Hernia, abdominal     His Past Surgical History Is Significant For: Past Surgical History:  Procedure Laterality Date  . Knee arthrocopy       His Family History Is Significant For: Family History  Problem Relation Age of Onset  . Diabetes Mother   . Hypertension Father   . Diabetes Father     His Social History Is Significant For: Social History   Socioeconomic History  . Marital status: Married    Spouse name: Not on file  . Number of  children: Not on file  . Years of education: Not on file  . Highest education level: Not on file  Occupational History  . Not on file  Social Needs  . Financial resource strain: Not on file  . Food insecurity    Worry: Not on file    Inability: Not on file  . Transportation needs    Medical: Not on file    Non-medical: Not on file  Tobacco Use  . Smoking status: Current Every Day Smoker    Types: Cigarettes  . Smokeless tobacco: Never Used  . Tobacco comment: 1/2 ppd   Substance and Sexual Activity  . Alcohol use: Yes    Alcohol/week: 3.0 - 6.0 standard drinks    Types: 3 - 6 Cans of beer per week  . Drug use: No  . Sexual activity: Not Currently  Lifestyle  . Physical activity    Days per  week: Not on file    Minutes per session: Not on file  . Stress: Not on file  Relationships  . Social Musician on phone: Not on file    Gets together: Not on file    Attends religious service: Not on file    Active member of club or organization: Not on file    Attends meetings of clubs or organizations: Not on file    Relationship status: Not on file  Other Topics Concern  . Not on file  Social History Narrative   Lives with his second wife.  Has farm animals and likes to work on cars. Likes to fish and hunt.  No regular exercise.   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 Codeine Nausea Only  :   His Current Medications Are:  Outpatient Encounter Medications as of 11/12/2018  Medication Sig  . amLODipine (NORVASC) 10 MG tablet Take 1 tablet (10 mg total) by mouth daily.  . sertraline (ZOLOFT) 100 MG tablet Take 1 tablet (100 mg total) by mouth daily.   No facility-administered encounter medications on file as of 11/12/2018.   :  Review of Systems:  Out of a complete 14 point review of systems, all are reviewed and negative with the exception of these symptoms as listed below:  Review of Systems  Neurological:       Pt presents today to discuss his sleep. Pt has never had a sleep study but does endorse snoring.  Epworth Sleepiness Scale 0= would never doze 1= slight chance of dozing 2= moderate chance of dozing 3= high chance of dozing  Sitting and reading: 1 Watching TV: 1 Sitting inactive in a public place (ex. Theater or meeting): 1 As a passenger in a car for an hour without a break: 0 Lying down to rest in the afternoon: 1 Sitting and talking to someone: 0 Sitting quietly after lunch (no alcohol): 1 In a car, while stopped in traffic: 0 Total: 5     Objective:  Neurological Exam  Physical Exam Physical Examination:   Vitals:    11/12/18 0851  BP: (!) 167/100  Pulse: (!) 59   General Examination: The patient is a very pleasant 48 y.o. male in no acute distress. He appears well-developed and well-nourished and adequately groomed.   HEENT: Normocephalic, atraumatic, pupils are equal, round and reactive to light. Extraocular tracking is well preserved in all directions, no nystagmus  noted.  Hearing is grossly intact.  Face is symmetric with normal facial animation.  Speech is clear without dysarthria, hypophonia or voice tremor.  He has no carotid bruits on auscultation.  Airway examination reveals mild to moderate mouth dryness, adequate dental hygiene, market airway crowding secondary to redundant soft palate, larger uvula, tonsils in place, not fully visualized, about 1-2+ in size is estimated, Mallampati is class III.  Tongue protrudes centrally in palate elevates symmetrically.  Nasal inspection Reveals no significant swelling or inferior turbinate hypertrophy or septal deviation. Voice is a little hoarse and raspy at times.  He reports that he recently had a cough and wheezing but these improved. He has a minimal overbite.  Chest: Clear to auscultation without wheezing, rhonchi or crackles noted.  Heart: S1+S2+0, regular and normal without murmurs, rubs or gallops noted.   Abdomen: Soft, non-tender and non-distended with normal bowel sounds appreciated on auscultation.  Extremities: There is trace pitting edema in the distal lower extremities bilaterally.   Skin: Warm and dry without trophic changes noted.  Musculoskeletal: exam reveals no obvious joint deformities, tenderness or joint swelling or erythema.   Neurologically:  Mental status: The patient is awake, alert and oriented in all 4 spheres. His immediate and remote memory, attention, language skills and fund of knowledge are appropriate. There is no evidence of aphasia, agnosia, apraxia or anomia. Speech is clear with normal prosody and enunciation. Thought  process is linear. Mood is normal and affect is normal.  Cranial nerves II - XII are as described above under HEENT exam. Motor exam: Normal bulk, strength and tone is noted. There is no drift, tremor or rebound. Romberg is negative. Fine motor skills and coordination: grossly intact.  Cerebellar testing: No dysmetria or intention tremor on finger to nose testing. Heel to shin is unremarkable bilaterally. There is no truncal or gait ataxia.  Sensory exam: intact to light touch in the upper and lower extremities.  Gait, station and balance: He stands easily. No veering to one side is noted. No leaning to one side is noted. Posture is age-appropriate and stance is narrow based. Gait shows normal stride length and normal pace. No problems turning are noted. Tandem walk is unremarkable.   Assessment and Plan:  In summary, XAYDEN LINSEY is a very pleasant 48 y.o.-year old male with an underlying medical history of hypertension, depression, smoking, and obesity, whose history and physical exam are concerning for obstructive sleep apnea (OSA).Contributing factors to his sleep difficulties are shiftwork and restless leg symptoms. I had a long chat with the patient about my findings and the diagnosis of OSA, its prognosis and treatment options. We talked about medical treatments, surgical interventions and non-pharmacological approaches. I explained in particular the risks and ramifications of untreated moderate to severe OSA, especially with respect to developing cardiovascular disease down the Road, including congestive heart failure, difficult to treat hypertension, cardiac arrhythmias, or stroke. Even type 2 diabetes has, in part, been linked to untreated OSA. Symptoms of untreated OSA include daytime sleepiness, memory problems, mood irritability and mood disorder such as depression and anxiety, lack of energy, as well as recurrent headaches, especially morning headaches. We talked about smoking cessation and  trying to maintain a healthy lifestyle in general, as well as the importance of weight control. I encouraged the patient to eat healthy, exercise daily and keep well hydrated, to keep a scheduled bedtime and wake time routine, to not skip any meals and eat healthy snacks in between meals. I  advised the patient not to drive when feeling sleepy.  We talked a little bit about restless leg syndrome as well.  He is advised that treating obstructive sleep apnea often results in improvement in restless leg symptoms as well as leg twitching at night.We will await test results.  We will try to get him in for a daytime sleep study on a Wednesday morning after work on Tuesday. I recommended the following at this time: sleep study.   I explained the sleep test procedure to the patient and also outlined possible surgical and non-surgical treatment options of OSA, including the use of a custom-made dental device (which would require a referral to a specialist dentist or oral surgeon), upper airway surgical options.  I also explained the CPAP treatment option to the patient, who indicated that he would be willing to try CPAP if the need arises. I explained the importance of being compliant with PAP treatment, not only for insurance purposes but primarily to improve His symptoms, and for the patient's long term health benefit, including to reduce His cardiovascular risks. I answered all his questions today and the patient was in agreement. I would like to see him back after the sleep study is completed and encouraged him to call with any interim questions, concerns, problems or updates.   Thank you very much for allowing me to participate in the care of this nice patient. If I can be of any further assistance to you please do not hesitate to call me at [phone].  Sincerely,   Omar FoleySaima Kairen Hallinan, Omar Ward, Omar Ward

## 2018-11-12 NOTE — Patient Instructions (Signed)

## 2018-11-28 ENCOUNTER — Other Ambulatory Visit: Payer: Self-pay

## 2018-11-28 ENCOUNTER — Ambulatory Visit (INDEPENDENT_AMBULATORY_CARE_PROVIDER_SITE_OTHER): Payer: 59 | Admitting: Neurology

## 2018-11-28 DIAGNOSIS — IMO0001 Reserved for inherently not codable concepts without codable children: Secondary | ICD-10-CM

## 2018-11-28 DIAGNOSIS — Z820 Family history of epilepsy and other diseases of the nervous system: Secondary | ICD-10-CM

## 2018-11-28 DIAGNOSIS — R030 Elevated blood-pressure reading, without diagnosis of hypertension: Secondary | ICD-10-CM

## 2018-11-28 DIAGNOSIS — G4761 Periodic limb movement disorder: Secondary | ICD-10-CM

## 2018-11-28 DIAGNOSIS — G4720 Circadian rhythm sleep disorder, unspecified type: Secondary | ICD-10-CM

## 2018-11-28 DIAGNOSIS — R0683 Snoring: Secondary | ICD-10-CM

## 2018-11-28 DIAGNOSIS — G2581 Restless legs syndrome: Secondary | ICD-10-CM

## 2018-11-28 DIAGNOSIS — F17200 Nicotine dependence, unspecified, uncomplicated: Secondary | ICD-10-CM

## 2018-11-28 DIAGNOSIS — G4733 Obstructive sleep apnea (adult) (pediatric): Secondary | ICD-10-CM

## 2018-11-28 DIAGNOSIS — R0681 Apnea, not elsewhere classified: Secondary | ICD-10-CM

## 2018-11-28 DIAGNOSIS — E669 Obesity, unspecified: Secondary | ICD-10-CM

## 2018-11-28 DIAGNOSIS — J449 Chronic obstructive pulmonary disease, unspecified: Secondary | ICD-10-CM

## 2018-11-28 DIAGNOSIS — G4726 Circadian rhythm sleep disorder, shift work type: Secondary | ICD-10-CM

## 2018-12-04 ENCOUNTER — Telehealth: Payer: Self-pay

## 2018-12-04 NOTE — Progress Notes (Signed)
Patient referred by Dr. Erin Hearing, seen by me on 11/12/18, diagnostic PSG on 11/28/18. (I corrected DOB in the paper version, but not in the epic version, so DOB is showing up as DOS, sorry!)  Please call and notify the patient that the recent sleep study showed severe obstructive sleep apnea. I recommend treatment for this in the form of CPAP. This will require a repeat sleep study for proper titration and mask fitting and correct monitoring of the oxygen saturations. Please explain to patient. I have placed an order in the chart. Thanks.  Star Age, MD, PhD [facility])

## 2018-12-04 NOTE — Addendum Note (Signed)
Addended by: Star Age on: 12/04/2018 08:24 AM   Modules accepted: Orders

## 2018-12-04 NOTE — Procedures (Signed)
PATIENT'S NAME:  Omar Ward, Omar Ward DOB:      11/28/2020      MR#:    [id number]     DATE OF RECORDING: 11/28/2018 REFERRING M.D.:  Talbert Cage, MD Study Performed:   Baseline Polysomnogram HISTORY: 48 year old man with a history of hypertension, depression, smoking, and obesity, who reports snoring and excessive sleepiness, as well as difficulty initiating and maintaining sleep.  He works third shift, 12 hours, 5 days a week.  He works at [employer] in [place], as a Dealer. He presents for a daytime sleep study to accommodate his sleep schedule. The patient endorsed the Epworth Sleepiness Scale at 5 points. The patient's weight 230 pounds with a height of 69 (inches), resulting in a BMI of 34. kg/m2.  CURRENT MEDICATIONS: Norvasc, Zoloft   PROCEDURE:  This is a multichannel digital polysomnogram utilizing the Somnostar 11.2 system.  Electrodes and sensors were applied and monitored per AASM Specifications.   EEG, EOG, Chin and Limb EMG, were sampled at 200 Hz.  ECG, Snore and Nasal Pressure, Thermal Airflow, Respiratory Effort, CPAP Flow and Pressure, Oximetry was sampled at 50 Hz. Digital video and audio were recorded.      BASELINE STUDY  Lights Out was at 08:22 and Lights On at 15:04.  Total recording time (TRT) was 376.5 minutes, with a total sleep time (TST) of 311 minutes.   The patient's sleep latency was 6 minutes.  REM latency was 341 minutes, which is markedly delayed. The sleep efficiency was 82.6 %.     SLEEP ARCHITECTURE: WASO (Wake after sleep onset) was 65.5 minutes with mild sleep fragmentation noted. There were 4 minutes in Stage N1, 273 minutes Stage N2, 1 minutes Stage N3 and 33 minutes in Stage REM.  The percentage of Stage N1 was 1.3%, Stage N2 was 87.8%, which is markedly increased, Stage N3 was near absent at .3% and Stage R (REM sleep) was 10.6%, which is reduced. The arousals were noted as: 56 were spontaneous, 0 were associated with PLMs, 233 were  associated with respiratory events.  RESPIRATORY ANALYSIS:  There were a total of 461 respiratory events:  239 obstructive apneas, 39 central apneas and 2 mixed apneas with a total of 280 apneas and an apnea index (AI) of 54. /hour. There were 181 hypopneas with a hypopnea index of 34.9 /hour. The patient also had 0 respiratory event related arousals (RERAs).      The total APNEA/HYPOPNEA INDEX (AHI) was 88.9 /hour and the total RESPIRATORY DISTURBANCE INDEX was 88.9 /hour.  38 events occurred in REM sleep and 348 events in NREM. The REM AHI was 69.1 /hour, versus a non-REM AHI of 91.3. The patient spent 102.5 minutes of total sleep time in the supine position and 209 minutes in non-supine.. The supine AHI was 72.5 versus a non-supine AHI of 97.2.  OXYGEN SATURATION & C02:  The Wake baseline 02 saturation was 0%, with the lowest being 83%. Time spent below 89% saturation equaled 170 minutes.  PERIODIC LIMB MOVEMENTS: The patient had a total of 6 Periodic Limb Movements.  The Periodic Limb Movement (PLM) index was 1.2 and the PLM Arousal index was 0/hour.  Audio and video analysis did not show any abnormal or unusual movements, behaviors, phonations or vocalizations. The patient took 2 bathroom breaks. Mild to moderate snoring was noted. The EKG was in keeping with normal sinus rhythm (NSR).  Post-study, the patient indicated that sleep was better than usual.   IMPRESSION:  1. Obstructive Sleep  Apnea (OSA) 2. Dysfunctions associated with sleep stages or arousal from sleep  RECOMMENDATIONS:  1. This study demonstrates severe obstructive sleep apnea, with a total AHI of 88.9/hour, REM AHI of 69.1/hour, supine AHI of 72.5/hour and O2 nadir of 83%. Treatment with positive airway pressure in the form of CPAP is recommended. This will require a full night titration study to optimize therapy. Other treatment options - generally speaking - may include avoidance of supine sleep position along with weight  loss, upper airway or jaw surgery in selected patients or the use of an oral appliance in certain patients. ENT evaluation and/or consultation with a maxillofacial surgeon or dentist may be feasible in some instances. Weight loss will likely help reduce the severity of his OSA. 2. Please note that untreated obstructive sleep apnea may carry additional perioperative morbidity. Patients with significant obstructive sleep apnea should receive perioperative PAP therapy and the surgeons and particularly the anesthesiologist should be informed of the diagnosis and the severity of the sleep disordered breathing. 3. This study shows sleep fragmentation and abnormal sleep stage percentages; these are nonspecific findings and per se do not signify an intrinsic sleep disorder or a cause for the patient's sleep-related symptoms. Causes include (but are not limited to) the first night effect of the sleep study, circadian rhythm disturbances, medication effect or an underlying mood disorder or medical problem.  4. The patient should be cautioned not to drive, work at heights, or operate dangerous or heavy equipment when tired or sleepy. Review and reiteration of good sleep hygiene measures should be pursued with any patient. 5. The patient will be seen in follow-up in the sleep clinic at [facility] for discussion of the test results, symptom and treatment compliance review, further management strategies, etc. The referring provider will be notified of the test results.  I certify that I have reviewed the entire raw data recording prior to the issuance of this report in accordance with the Standards of Accreditation of the American Academy of Sleep Medicine (AASM)  Huston Foley, MD, PhD Diplomat, American Board of Neurology and Sleep Medicine (Neurology and Sleep Medicine)

## 2018-12-04 NOTE — Telephone Encounter (Signed)
-----   Message from Star Age, MD sent at 12/04/2018  8:24 AM EDT ----- Patient referred by Dr. Erin Hearing, seen by me on 11/12/18, diagnostic PSG on 11/28/18. (I corrected DOB in the paper version, but not in the epic version, so DOB is showing up as DOS, sorry!)  Please call and notify the patient that the recent sleep study showed severe obstructive sleep apnea. I recommend treatment for this in the form of CPAP. This will require a repeat sleep study for proper titration and mask fitting and correct monitoring of the oxygen saturations. Please explain to patient. I have placed an order in the chart. Thanks.  Star Age, MD, PhD [facility])

## 2018-12-04 NOTE — Telephone Encounter (Signed)
I called pt to discuss his sleep study results. No answer, left a message asking him to call me back. 

## 2018-12-05 NOTE — Telephone Encounter (Signed)

## 2018-12-12 ENCOUNTER — Other Ambulatory Visit: Payer: Self-pay

## 2018-12-12 ENCOUNTER — Ambulatory Visit (INDEPENDENT_AMBULATORY_CARE_PROVIDER_SITE_OTHER): Payer: 59 | Admitting: Family Medicine

## 2018-12-12 ENCOUNTER — Encounter: Payer: Self-pay | Admitting: Family Medicine

## 2018-12-12 DIAGNOSIS — F17200 Nicotine dependence, unspecified, uncomplicated: Secondary | ICD-10-CM | POA: Diagnosis not present

## 2018-12-12 DIAGNOSIS — F339 Major depressive disorder, recurrent, unspecified: Secondary | ICD-10-CM

## 2018-12-12 DIAGNOSIS — G4730 Sleep apnea, unspecified: Secondary | ICD-10-CM

## 2018-12-12 DIAGNOSIS — I10 Essential (primary) hypertension: Secondary | ICD-10-CM

## 2018-12-12 NOTE — Assessment & Plan Note (Signed)
Unchanged.  Will wait for sleep apnea treatment before working more on cessation

## 2018-12-12 NOTE — Assessment & Plan Note (Signed)
Severe.  Scheduled for mask fitting.  Hopefully will improve his hypertension and mood

## 2018-12-12 NOTE — Assessment & Plan Note (Signed)
At goal continue current medication

## 2018-12-12 NOTE — Patient Instructions (Signed)
Good to see you today!  Thanks for coming in.  Keep doing what you are  Consider slowly decreasing the number of cigarettes  We will refer you to GI for a colonoscopy once the CPAP is going well  Let me know if the CPAP is not going well -   Come back January   Chloraseptic spray or cepacol lozenges for the tongue ulcer

## 2018-12-12 NOTE — Progress Notes (Signed)
Subjective  Omar Ward is a 48 y.o. male is presenting with the following  DEPRESSION Feels about the same.  PHQ9 = 11 with highest in fatigue and sleep.  Taking sertraline regularly  HYPERTENSION Disease Monitoring Home BP Monitoring (Severity) not taking Symptoms - Chest pain- no    Dyspnea- no Medications   Compliance-  Daily amlodipine. Lightheadedness-  no  Edema- no Timing - continuous  Duration - years  SLEEP APNEA Had sleep study - diagnosed as severe sleep apnea.  Has appointment to have mask fitting this month.  Feels his sleep is very framented and has lots of day time sleepiness  TOBACCO  unchanged about 1/2 ppd.  Would like to stop    Chief Complaint noted Review of Symptoms - see HPI PMH - Smoking status noted.    Objective Vital Signs reviewed BP 126/80   Pulse 68   Wt 230 lb 12.8 oz (104.7 kg)   SpO2 97%   BMI 34.08 kg/m   Assessments/Plans  Essential hypertension At goal continue current medication   Tobacco use disorder Unchanged.  Will wait for sleep apnea treatment before working more on cessation  Sleep apnea Severe.  Scheduled for mask fitting.  Hopefully will improve his hypertension and mood  Depression, recurrent (HCC) Unchanged.  Will see if treating sleep apnea will improve his fatigue symptoms    See after visit summary for details of patient instructions

## 2018-12-12 NOTE — Assessment & Plan Note (Signed)
Unchanged.  Will see if treating sleep apnea will improve his fatigue symptoms

## 2018-12-17 ENCOUNTER — Other Ambulatory Visit (HOSPITAL_COMMUNITY)
Admission: RE | Admit: 2018-12-17 | Discharge: 2018-12-17 | Disposition: A | Discharge status: Home / Self Care | Payer: 59 | Source: Ambulatory Visit | Attending: Neurology | Admitting: Neurology

## 2018-12-17 DIAGNOSIS — Z01812 Encounter for preprocedural laboratory examination: Secondary | ICD-10-CM | POA: Diagnosis not present

## 2018-12-17 DIAGNOSIS — Z20828 Contact with and (suspected) exposure to other viral communicable diseases: Secondary | ICD-10-CM | POA: Diagnosis not present

## 2018-12-17 LAB — SARS CORONAVIRUS 2 (TAT 6-24 HRS): SARS Coronavirus 2: NEGATIVE

## 2018-12-19 ENCOUNTER — Ambulatory Visit (INDEPENDENT_AMBULATORY_CARE_PROVIDER_SITE_OTHER): Payer: 59 | Admitting: Neurology

## 2018-12-19 DIAGNOSIS — F17200 Nicotine dependence, unspecified, uncomplicated: Secondary | ICD-10-CM

## 2018-12-19 DIAGNOSIS — E669 Obesity, unspecified: Secondary | ICD-10-CM

## 2018-12-19 DIAGNOSIS — G4733 Obstructive sleep apnea (adult) (pediatric): Secondary | ICD-10-CM | POA: Diagnosis not present

## 2018-12-19 DIAGNOSIS — Z820 Family history of epilepsy and other diseases of the nervous system: Secondary | ICD-10-CM

## 2018-12-19 DIAGNOSIS — G4761 Periodic limb movement disorder: Secondary | ICD-10-CM

## 2018-12-19 DIAGNOSIS — R030 Elevated blood-pressure reading, without diagnosis of hypertension: Secondary | ICD-10-CM

## 2018-12-19 DIAGNOSIS — J449 Chronic obstructive pulmonary disease, unspecified: Secondary | ICD-10-CM

## 2018-12-19 DIAGNOSIS — G4720 Circadian rhythm sleep disorder, unspecified type: Secondary | ICD-10-CM

## 2018-12-19 DIAGNOSIS — G2581 Restless legs syndrome: Secondary | ICD-10-CM

## 2018-12-19 DIAGNOSIS — G4726 Circadian rhythm sleep disorder, shift work type: Secondary | ICD-10-CM

## 2018-12-19 DIAGNOSIS — IMO0001 Reserved for inherently not codable concepts without codable children: Secondary | ICD-10-CM

## 2018-12-26 ENCOUNTER — Telehealth: Payer: Self-pay

## 2018-12-26 NOTE — Addendum Note (Signed)
Addended by: Star Age on: 12/26/2018 08:00 AM   Modules accepted: Orders

## 2018-12-26 NOTE — Telephone Encounter (Signed)
I called pt. I advised pt that Dr. Rexene Alberts reviewed their sleep study results and found that pt did well with the cpap during his latest sleep study. Dr. Rexene Alberts recommends that pt start a cpap at home. I reviewed PAP compliance expectations with the pt. Pt is agreeable to starting a CPAP. I advised pt that an order will be sent to a DME, [facility], and [facility] will call the pt within about one week after they file with the pt's insurance. [facility] will show the pt how to use the machine, fit for masks, and troubleshoot the CPAP if needed. A follow up appt was made for insurance purposes with Amy, NP on 03/13/19 at 8:00am. Pt verbalized understanding to arrive 15 minutes early and bring their CPAP. A letter with all of this information in it will be mailed to the pt as a reminder. I verified with the pt that the address we have on file is correct. Pt verbalized understanding of results. Pt had no questions at this time but was encouraged to call back if questions arise. I have sent the order to [facility] and have received confirmation that they have received the order.

## 2018-12-26 NOTE — Procedures (Signed)
PATIENT'S NAME:  Rivers, Hamrick DOB:      1970-05-27      MR#:    [id number]     DATE OF RECORDING: 12/19/2018 REFERRING M.D.:  Dr. Erin Hearing Study Performed:   CPAP  Titration HISTORY: 48 year old man with a history of hypertension, depression, smoking, and obesity, who presents for a full night titration study to treat his OSA. His baseline sleep study from 11/28/18 showed severe obstructive sleep apnea, with a total AHI of 88.9/hour, REM AHI of 69.1/hour, supine AHI of 72.5/hour and O2 nadir of 83%. 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 The patient endorsed the Epworth Sleepiness Scale at 5/24 points. The patient's weight 229 pounds with a height of 69 (inches), resulting in a BMI of 34. kg/m2.  CURRENT MEDICATIONS: Amlodipine and Zoloft  PROCEDURE:  This is a multichannel digital polysomnogram utilizing the SomnoStar 11.2 system.  Electrodes and sensors were applied and monitored per AASM Specifications.   EEG, EOG, Chin and Limb EMG, were sampled at 200 Hz.  ECG, Snore and Nasal Pressure, Thermal Airflow, Respiratory Effort, CPAP Flow and Pressure, Oximetry was sampled at 50 Hz. Digital video and audio were recorded.      The patient was fitted with a medium Dreamwear FFM. CPAP was initiated at 5 cm H20 with heated humidity per AASM split night standards and pressure was advanced to 15 cmH20 because of hypopneas, apneas and desaturations.  At a PAP pressure of 15 cmH20, there was a reduction of the AHI to 0/hour with non-supine NREM sleep achieved and O2 nadir of 91%.  Lights Out was at 08:09 and Lights On at 15:05. Total recording time (TRT) was 415.5 minutes, with a total sleep time (TST) of 300 minutes. The patient's sleep latency was 18 minutes. REM latency was 64.5 minutes, which is mildly reduced. The sleep efficiency was 72.2 %.    SLEEP ARCHITECTURE: WASO (Wake after sleep onset) was 99.5 minutes with 2 longer periods of wakefulness and otherwise mild sleep fragmentation  noted.  There were 7 minutes in Stage N1, 190.5 minutes Stage N2, 59.5 minutes Stage N3 and 43 minutes in Stage REM.  The percentage of Stage N1 was 2.3%, Stage N2 was 63.5%, which is increased, Stage N3 was 19.8% and Stage R (REM sleep) was 14.3%, which is reduced. The arousals were noted as: 23 were spontaneous, 0 were associated with PLMs, 32 were associated with respiratory events.  RESPIRATORY ANALYSIS:  There was a total of 74 respiratory events: 30 obstructive apneas, 10 central apneas and 0 mixed apneas with a total of 40 apneas and an apnea index (AI) of 8 /hour. There were 34 hypopneas with a hypopnea index of 6.8/hour. The patient also had 0 respiratory event related arousals (RERAs).      The total APNEA/HYPOPNEA INDEX  (AHI) was 14.8 /hour and the total RESPIRATORY DISTURBANCE INDEX was 14.8 /hour  5 events occurred in REM sleep and 69 events in NREM. The REM AHI was 7. /hour versus a non-REM AHI of 16.1 /hour.  The patient spent 1 minutes of total sleep time in the supine position and 299 minutes in non-supine. The supine AHI was 120.0, versus a non-supine AHI of 14.4.  OXYGEN SATURATION & C02: The baseline 02 saturation was 94%, with the lowest being 86%. Time spent below 89% saturation equaled 8 minutes.  PERIODIC LIMB MOVEMENTS: The patient had a total of 7 Periodic Limb Movements. The Periodic Limb Movement (PLM) index was 1.4 and  the PLM Arousal index was 0 /hour.  Audio and video analysis did not show any abnormal or unusual movements, behaviors, phonations or vocalizations. The patient took 1 bathroom break. The EKG was in keeping with normal sinus rhythm (NSR).  Post-study, the patient indicated that sleep was better than usual.   IMPRESSION:   1. Obstructive Sleep Apnea (OSA) 2. Dysfunctions associated with sleep stages or arousal from sleep   RECOMMENDATIONS:   1. This study demonstrates significant improvement of the patient's obstructive sleep apnea with CPAP therapy. I  will, therefore, start the patient on home CPAP treatment at a pressure of 15 cm via medium FFM; he may require a higher pressure to improve his OSA during supine REM sleep. The patient should be reminded to be fully compliant with PAP therapy to improve sleep related symptoms and decrease long term cardiovascular risks. The patient should be reminded, that it may take up to 3 months to get fully used to using PAP with all planned sleep. The earlier full compliance is achieved, the better long term compliance tends to be. Please note that untreated obstructive sleep apnea may carry additional perioperative morbidity. Patients with significant obstructive sleep apnea should receive perioperative PAP therapy and the surgeons and particularly the anesthesiologist should be informed of the diagnosis and the severity of the sleep disordered breathing. 2. This study shows sleep fragmentation and abnormal sleep stage percentages; these are nonspecific findings and per se do not signify an intrinsic sleep disorder or a cause for the patient's sleep-related symptoms. Causes include (but are not limited to) the first night effect of the sleep study, circadian rhythm disturbances, medication effect or an underlying mood disorder or medical problem.  3. The patient should be cautioned not to drive, work at heights, or operate dangerous or heavy equipment when tired or sleepy. Review and reiteration of good sleep hygiene measures should be pursued with any patient. 4. The patient will be seen in follow-up in the sleep clinic at [facility] for discussion of the test results, symptom and treatment compliance review, further management strategies, etc. The referring provider will be notified of the test results.   I certify that I have reviewed the entire raw data recording prior to the issuance of this report in accordance with the Standards of Accreditation of the American Academy of Sleep Medicine (AASM)   Huston Foley, MD,  PhD Diplomat, American Board of Neurology and Sleep Medicine (Neurology and Sleep Medicine)

## 2018-12-26 NOTE — Telephone Encounter (Signed)
-----   Message from Star Age, MD sent at 12/26/2018  8:00 AM EDT ----- Patient referred by Dr. Erin Hearing, seen by me on 11/12/18, diagnostic PSG on 11/28/18. 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  Please call and inform patient that I have entered an order for treatment with positive airway pressure (PAP) treatment for obstructive sleep apnea (OSA). He did well during the latest sleep study with CPAP. We will, therefore, arrange for a machine for home use through a DME (durable medical equipment) company of His choice; and I will see the patient back in follow-up in about 10 weeks. Please also explain to the patient that I will be looking out for compliance data, which can be downloaded from the machine (stored on an SD card, that is inserted in the machine) or via remote access through a modem, that is built into the machine. At the time of the followup appointment we will discuss sleep study results and how it is going with PAP treatment at home. Please advise patient to bring His machine at the time of the first FU visit, even though this is cumbersome. Bringing the machine for every visit after that will likely not be needed, but often helps for the first visit to troubleshoot if needed. Please re-enforce the importance of compliance with treatment and the need for [place] to monitor compliance data - often an insurance requirement and actually good feedback for the patient as far as how they are doing.  Also remind patient, that any interim PAP machine or mask issues should be first addressed with the DME company, as they can often help better with technical and mask fit issues. Please ask if patient has a preference regarding DME company.  Please also make sure, the patient has a follow-up appointment with me in about 10 weeks from the setup date, thanks. May see one of our nurse practitioners if needed for proper timing of the FU appointment.  Please fax or rout  report to the referring provider. Thanks,   Star Age, MD, PhD [facility])

## 2019-01-10 NOTE — Telephone Encounter (Signed)
Received notice from [facility] that they have been unable to get in touch with pt.

## 2019-01-10 NOTE — Telephone Encounter (Signed)
I called pt. He has not received any calls from [facility]. I gave him their [place] office number and he will call them.

## 2019-01-16 DIAGNOSIS — G4733 Obstructive sleep apnea (adult) (pediatric): Secondary | ICD-10-CM | POA: Diagnosis not present

## 2019-01-17 ENCOUNTER — Telehealth: Payer: Self-pay

## 2019-01-17 MED FILL — AMLODIPINE BESYLATE 10 MG T: 10 | 90 days supply | Qty: 90 | Fill #1

## 2019-01-17 NOTE — Telephone Encounter (Signed)
Pt has called and left a message that he was setup with his CPAP machine yesterday.

## 2019-01-17 NOTE — Telephone Encounter (Signed)
Noted, thanks!

## 2019-01-22 ENCOUNTER — Ambulatory Visit (INDEPENDENT_AMBULATORY_CARE_PROVIDER_SITE_OTHER): Payer: 59 | Admitting: Family Medicine

## 2019-01-22 ENCOUNTER — Other Ambulatory Visit: Payer: Self-pay

## 2019-01-22 DIAGNOSIS — R600 Localized edema: Secondary | ICD-10-CM | POA: Insufficient documentation

## 2019-01-22 NOTE — Patient Instructions (Signed)
I'm sorry that you have had so much pain with your leg swelling.  I think that the first think to do would be to hold your amlodipine.  We will see if this is causing the swelling.  In addition to the medication change, look up compression socks and put them on when you wake from sleeping.  Let's follow up in two weeks to check on your swelling and blood pressure.  We may start a medication at that time.

## 2019-01-22 NOTE — Assessment & Plan Note (Signed)
No history of heart failure.  No suspicion for heart failure at this time.  Low suspicion for DVT, Wells score is 0.  No evidence of infection.  Recent labs showed no abnormalities in electrolytes, kidney function, thyroid.  Possibly secondary to adverse effect of amlodipine.  May be related to venous insufficiency. -Discontinue amlodipine, follow-up in 1 month to assess blood pressure and consider alternative blood pressure medication.  If lower extremity swelling is unchanged amlodipine may not be the culprit. -Begin using compression socks during the day

## 2019-01-22 NOTE — Progress Notes (Signed)
    Subjective:  Omar Ward is a 48 y.o. male who presents to the [facility] today with a chief complaint of bilateral lower extremity swelling and pain.   HPI: Lower extremity edema/pain Omar Ward reports a 2-week history of increasing lower extremity edema.  He reports that he has noticed swelling of his feet on both sides.  His socks have become very loose and still leave marks on his legs when he takes them off of the end of the day.  He does not recall any significant trauma to his feet.  He has never had significant swelling like this before.  He does remember that when Dr. Wilfrid Lund started him on amlodipine several years ago, he was told to keep an eye out for lower extremity swelling.  He reports mild tenderness to palpation of the inside of his ankles more on the left side than the right.  No history of heart failure.  He denies shortness of breath with activity, nocturnal dyspnea, difficulty with ambulation, sedentary lifestyle.  Chief Complaint noted Review of Symptoms - see HPI PMH -hypertension   Objective:  Physical Exam: BP 140/84   Pulse 71   Ht 5\' 9"  (1.753 m)   Wt 233 lb 8 oz (105.9 kg)   SpO2 98%   BMI 34.48 kg/m    Gen: NAD, resting comfortably Pulmonary: Breathing comfortably on room air Extremities: Symmetric bilateral lower extremity swelling.  2+ pitting edema up to about 5 cm superior to his medial malleolus.  Sock line notable.  Feet are warm and well perfused, pink and healthy appearing.  Normal sensation to light touch bilaterally palpable DP pulses bilaterally.  No results found for this or any previous visit (from the past 72 hour(s)).   Assessment/Plan:  Bilateral leg edema No history of heart failure.  No suspicion for heart failure at this time.  Low suspicion for DVT, Wells score is 0.  No evidence of infection.  Recent labs showed no abnormalities in electrolytes, kidney function, thyroid.  Possibly secondary to adverse effect of amlodipine.  May be  related to venous insufficiency. -Discontinue amlodipine, follow-up in 1 month to assess blood pressure and consider alternative blood pressure medication.  If lower extremity swelling is unchanged amlodipine may not be the culprit. -Begin using compression socks during the day

## 2019-01-29 ENCOUNTER — Other Ambulatory Visit: Payer: Self-pay

## 2019-01-29 ENCOUNTER — Ambulatory Visit (INDEPENDENT_AMBULATORY_CARE_PROVIDER_SITE_OTHER): Payer: 59 | Admitting: Family Medicine

## 2019-01-29 ENCOUNTER — Encounter: Payer: Self-pay | Admitting: Family Medicine

## 2019-01-29 DIAGNOSIS — G4730 Sleep apnea, unspecified: Secondary | ICD-10-CM | POA: Diagnosis not present

## 2019-01-29 DIAGNOSIS — R600 Localized edema: Secondary | ICD-10-CM

## 2019-01-29 DIAGNOSIS — I10 Essential (primary) hypertension: Secondary | ICD-10-CM

## 2019-01-29 MED ORDER — INDAPAMIDE 1.25 MG PO TABS: 1.2500 mg | ORAL_TABLET | Freq: Every day | ORAL | 1 refills | Status: DC

## 2019-01-29 MED FILL — INDAPAMIDE 1.25 MG TABS: 1.25 | 30 days supply | Qty: 30 | Fill #0

## 2019-01-29 NOTE — Patient Instructions (Signed)
Good to see you today!  Thanks for coming in.  Start indapamide one pill when you wake up every day.  It should help with blood pressure and the swelling  I will call you if your lab tests are not normal.  Otherwise we will discuss them at your next visit.  We will order an [place] of your abdomen  Keep taking your blood pressure at home and bring in readings.  Call them in to the office in one week   Come back in 2 weeks for a visit Bring your blood pressure cuff

## 2019-01-29 NOTE — Progress Notes (Addendum)
S 

## 2019-01-29 NOTE — Assessment & Plan Note (Addendum)
Since not significant improvement off amlodipine will check labs.  Given history of substance use in past and mildly elevated LFTs if blood work is not revealing will check [place] of liver.  Does not appear to be CHF

## 2019-01-29 NOTE — Assessment & Plan Note (Signed)
Hopefully he will adapt to CPAP with improvement in his fatigue and sleepiness

## 2019-01-29 NOTE — Progress Notes (Signed)
Subjective  Omar Ward is a 48 y.o. male is presenting with the following  EDEMA Has had for about the last month and has been worsening Has improved a little since stopping the amlodipine a week ago but still bothers him a lot when he is on his feet.  Also is discouraged he can not lose weight.   No shortness of breath or chest pain but feels tired all over  HYPERTENSION Disease Monitoring Home BP Monitoring (Severity) shows pictures of several blood pressure in upper 100s/lower 100s Symptoms - Chest pain- no    Dyspnea- no Medications   Compliance-  No blood pressure medications currently. Lightheadedness-  no  Edema- see above Timing - continuous  Duration - years  SLEEP APNEA Wearing CPAP but finds it very hard to sleep with on.  Feels he will eventually get used to it     Chief Complaint noted Review of Symptoms - see HPI PMH - Smoking status noted.    Objective Vital Signs reviewed BP (!) 142/82   Pulse 75   Wt 234 lb (106.1 kg)   SpO2 96%   BMI 34.56 kg/m  NAD Heart - Regular rate and rhythm.  No murmurs, gallops or rubs.    Lungs:  Normal respiratory effort, chest expands symmetrically. Lungs are clear to auscultation, no crackles or wheezes. BP 180/104 to my check Extrem - 1-2+ edema with sock marks below mid calf.  (He reports has been elevating the last few hours while waiting for the appointment) No skin breakdown   Assessments/Plans  Bilateral leg edema Since not significant improvement off amlodipine will check labs.  Given history of substance use in past and mildly elevated LFTs if blood work is not revealing will check [place] of liver.  Does not appear to be CHF   Essential hypertension Poorly controlled off CCB.  Will start indapamide and monitor at home with close follow up in 2 weeks   Sleep apnea Hopefully he will adapt to CPAP with improvement in his fatigue and sleepiness    See after visit summary for details of patient instructions

## 2019-01-29 NOTE — Assessment & Plan Note (Signed)
Poorly controlled off CCB.  Will start indapamide and monitor at home with close follow up in 2 weeks

## 2019-01-30 ENCOUNTER — Ambulatory Visit (HOSPITAL_COMMUNITY)
Admission: RE | Admit: 2019-01-30 | Discharge: 2019-01-30 | Disposition: A | Discharge status: Home / Self Care | Payer: 59 | Source: Ambulatory Visit | Attending: Family Medicine | Admitting: Family Medicine

## 2019-01-30 DIAGNOSIS — K7689 Other specified diseases of liver: Secondary | ICD-10-CM | POA: Diagnosis not present

## 2019-01-30 DIAGNOSIS — R600 Localized edema: Secondary | ICD-10-CM | POA: Diagnosis not present

## 2019-01-30 LAB — CMP14+EGFR
ALT: 104 IU/L — ABNORMAL HIGH (ref 0–44)
AST: 56 IU/L — ABNORMAL HIGH (ref 0–40)
Albumin/Globulin Ratio: 1.9 (ref 1.2–2.2)
Albumin: 4.4 g/dL (ref 4.0–5.0)
Alkaline Phosphatase: 100 IU/L (ref 39–117)
BUN/Creatinine Ratio: 12 (ref 9–20)
BUN: 11 mg/dL (ref 6–24)
Bilirubin Total: 0.2 mg/dL (ref 0.0–1.2)
CO2: 27 mmol/L (ref 20–29)
Calcium: 9.4 mg/dL (ref 8.7–10.2)
Chloride: 102 mmol/L (ref 96–106)
Creatinine, Ser: 0.95 mg/dL (ref 0.76–1.27)
GFR calc Af Amer: 107 mL/min/{1.73_m2} (ref >59)
GFR calc non Af Amer: 93 mL/min/{1.73_m2} (ref >59)
Globulin, Total: 2.3 g/dL (ref 1.5–4.5)
Glucose: 99 mg/dL (ref 65–99)
Potassium: 4 mmol/L (ref 3.5–5.2)
Sodium: 143 mmol/L (ref 134–144)
Total Protein: 6.7 g/dL (ref 6.0–8.5)

## 2019-01-30 LAB — CBC
Hematocrit: 43.2 % (ref 37.5–51.0)
Hemoglobin: 14.6 g/dL (ref 13.0–17.7)
MCH: 34 pg — ABNORMAL HIGH (ref 26.6–33.0)
MCHC: 33.8 g/dL (ref 31.5–35.7)
MCV: 101 fL — ABNORMAL HIGH (ref 79–97)
Platelets: 152 10*3/uL (ref 150–450)
RBC: 4.3 x10E6/uL (ref 4.14–5.80)
RDW: 12.5 % (ref 11.6–15.4)
WBC: 8.9 10*3/uL (ref 3.4–10.8)

## 2019-01-30 IMAGING — US US ABDOMEN LIMITED
1 series · 14 of 25 positions shown · non-contrast
Comparison: None.

CLINICAL DATA: Elevated liver enzymes

EXAM:
ULTRASOUND ABDOMEN LIMITED RIGHT UPPER QUADRANT

[Series 1: us abdomen limited · 14 of 42 slices shown]
[im 1/42]
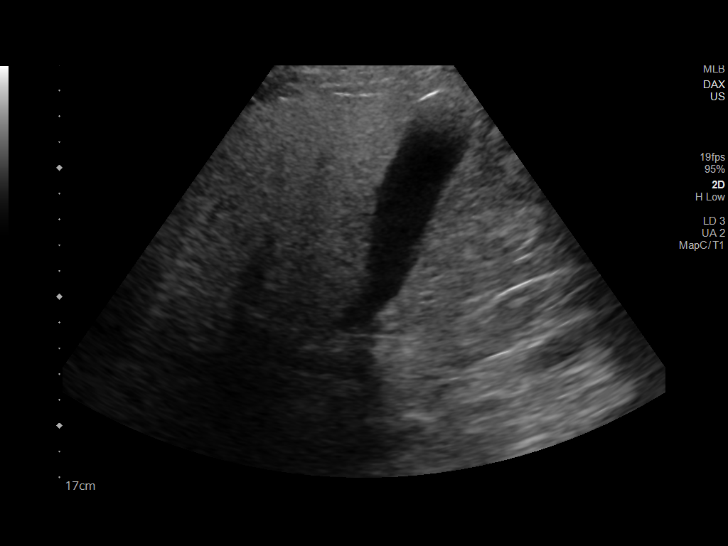
[im 4/42]
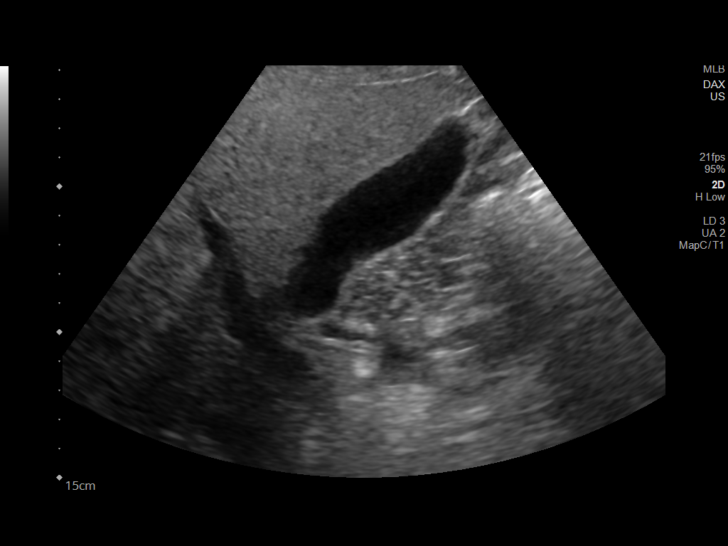
[im 7/42]
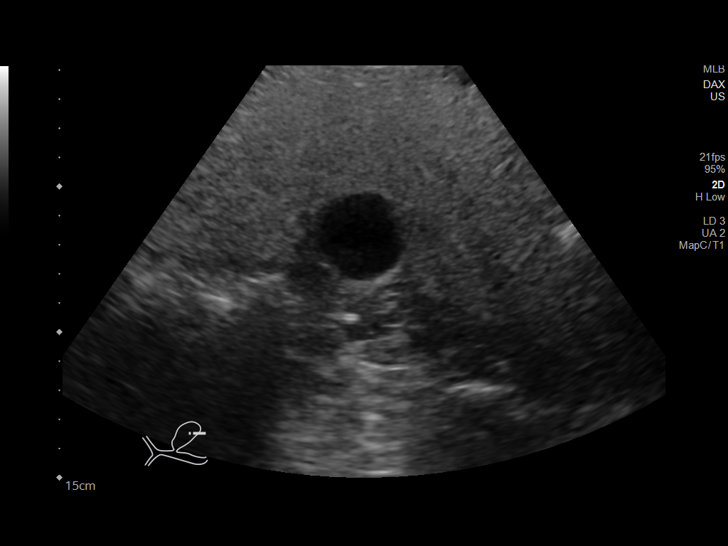
[im 11/42]
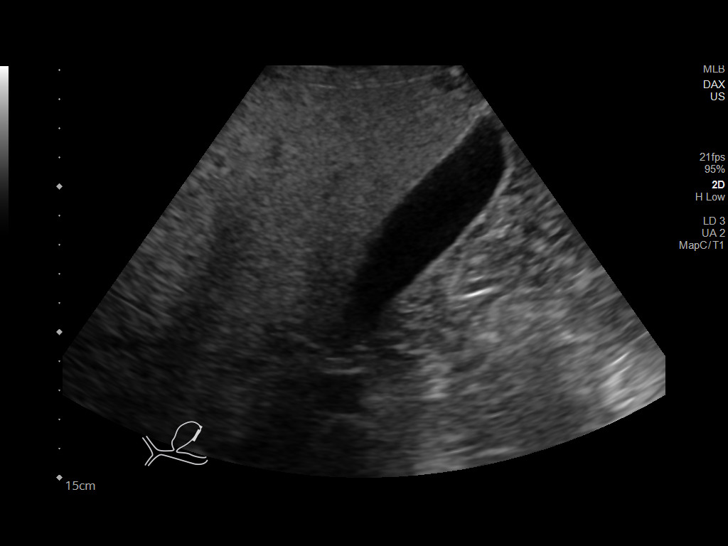
[im 14/42]
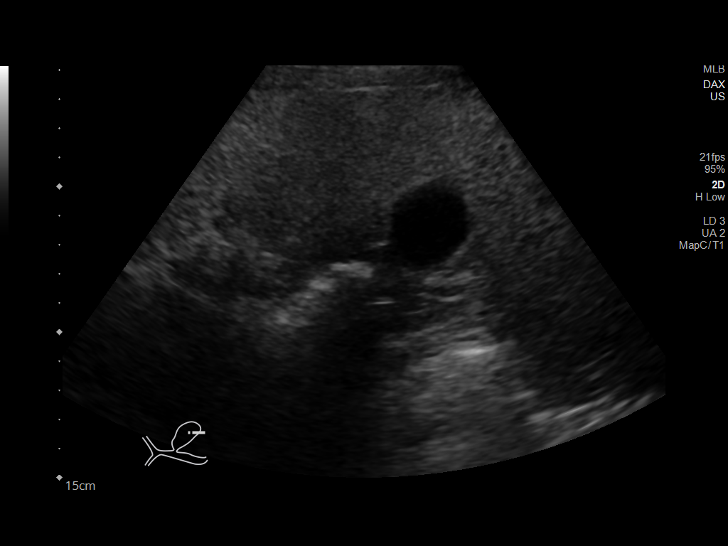
[im 16/42]
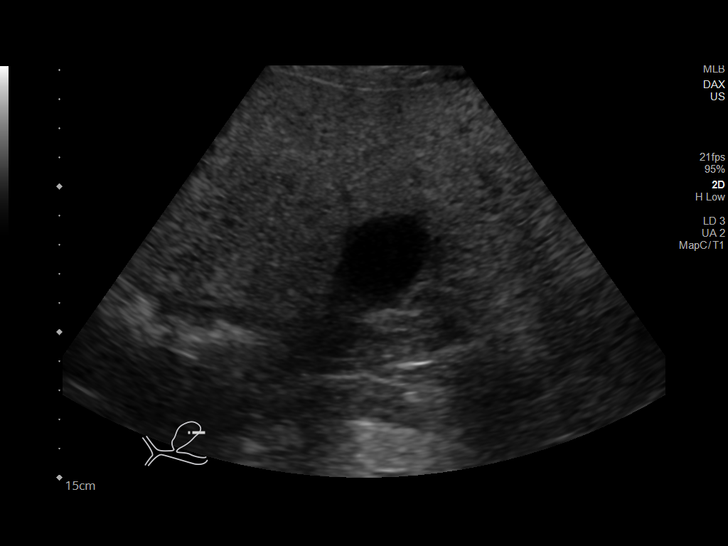
[im 19/42]
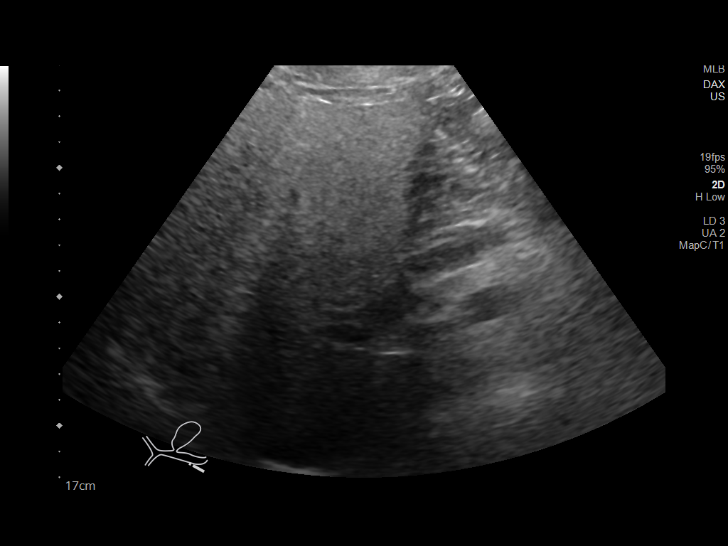
[im 23/42]
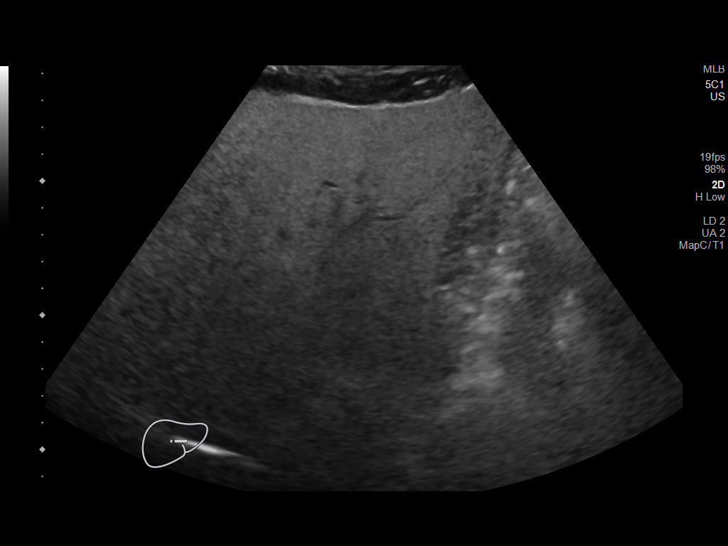
[im 26/42]
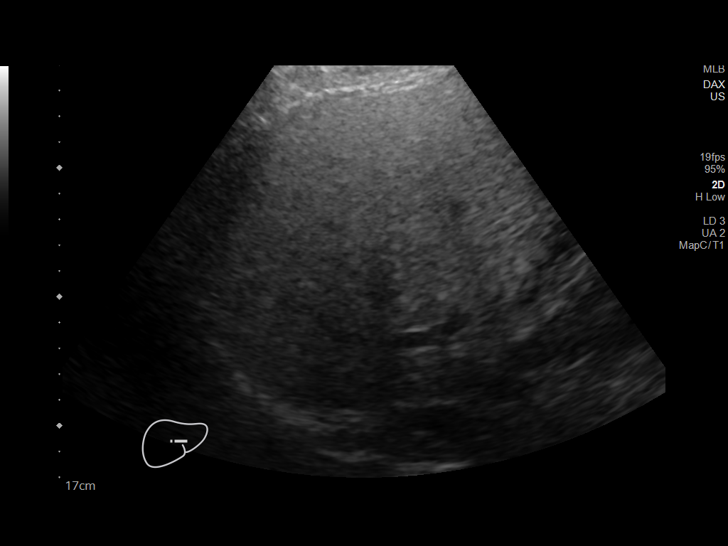
[im 28/42]
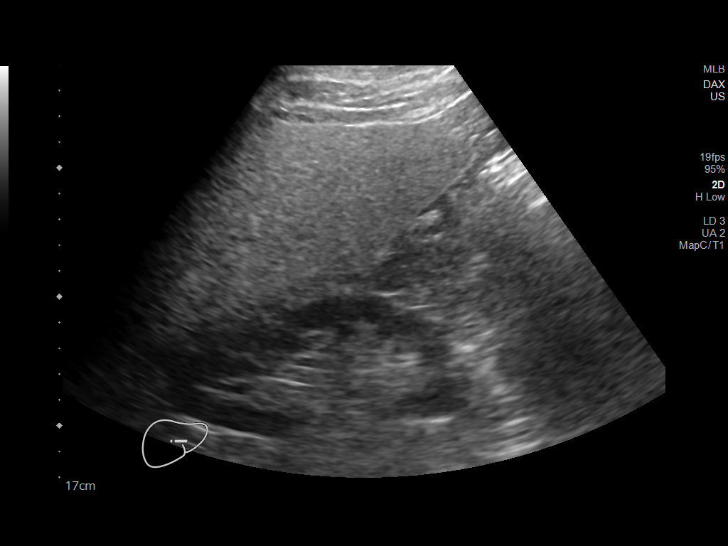
[im 31/42]
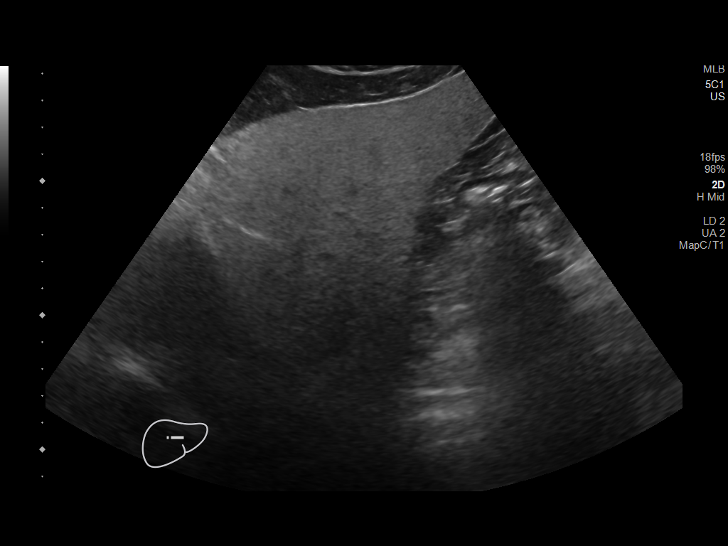
[im 35/42]
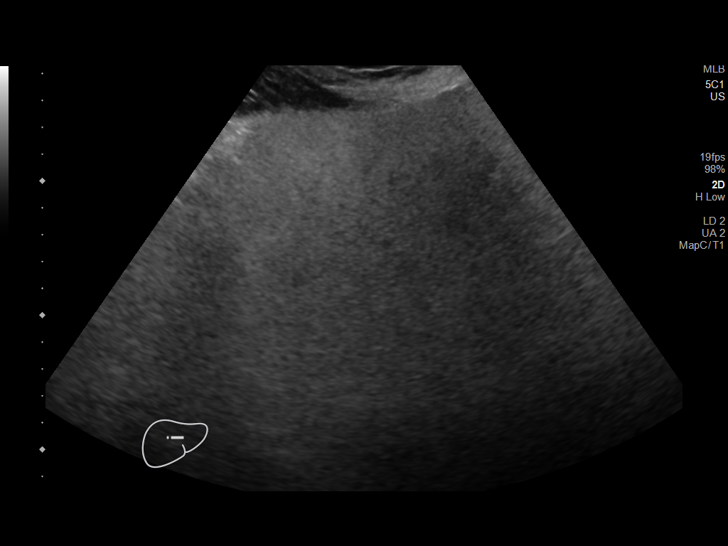
[im 38/42]
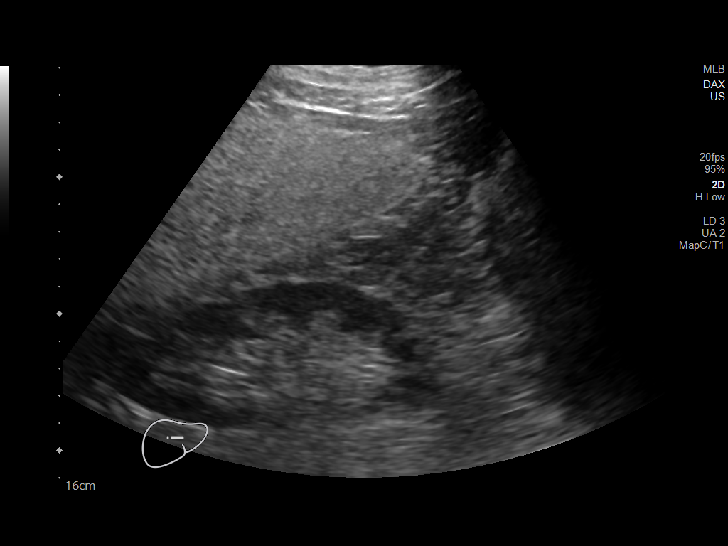
[im 42/42]
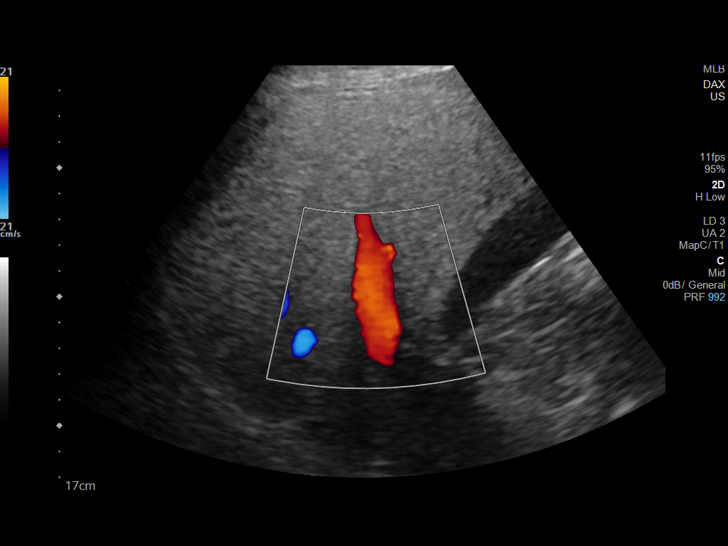

[14 of 25 positions shown; findings below may reference images not displayed]

FINDINGS: Gallbladder:

No gallstones or wall thickening visualized. There is no
pericholecystic fluid. No sonographic Murphy sign noted by
sonographer.

Common bile duct:

Diameter: 6 mm. No intrahepatic or extrahepatic biliary duct
dilatation

Liver:

No focal lesion identified. Liver echogenicity is increased
diffusely. Portal vein is patent on color Doppler imaging with
normal direction of blood flow towards the liver.

Other: None.
IMPRESSION: 1. Diffuse increase in liver echogenicity, a finding indicative of
hepatic steatosis. While no focal liver lesions are evident on this
study, it must be cautioned that the sensitivity of ultrasound for
detection of focal liver lesions is diminished in this circumstance.

2.  Study otherwise unremarkable.

## 2019-01-30 MED FILL — SERTRALINE HCL 100 MG TAB: 100 | 90 days supply | Qty: 90 | Fill #1

## 2019-02-04 ENCOUNTER — Ambulatory Visit (HOSPITAL_COMMUNITY): Payer: 59

## 2019-02-13 ENCOUNTER — Ambulatory Visit: Payer: 59 | Admitting: Family Medicine

## 2019-02-13 ENCOUNTER — Other Ambulatory Visit: Payer: Self-pay

## 2019-02-13 ENCOUNTER — Ambulatory Visit (INDEPENDENT_AMBULATORY_CARE_PROVIDER_SITE_OTHER): Payer: 59 | Admitting: Family Medicine

## 2019-02-13 DIAGNOSIS — F1020 Alcohol dependence, uncomplicated: Secondary | ICD-10-CM | POA: Insufficient documentation

## 2019-02-13 DIAGNOSIS — R748 Abnormal levels of other serum enzymes: Secondary | ICD-10-CM | POA: Diagnosis not present

## 2019-02-13 DIAGNOSIS — F339 Major depressive disorder, recurrent, unspecified: Secondary | ICD-10-CM | POA: Diagnosis not present

## 2019-02-13 NOTE — Assessment & Plan Note (Signed)
Complicated by recently revealed alcoholism.  Continue SSRI.

## 2019-02-13 NOTE — Assessment & Plan Note (Addendum)
Very likely cause of his elevated lfts and low plts and elevated MCV.  He will consider stopping.  Does not want to consider detox.  Will contact me if wishes suggestions.  Does not have history of DTs.  He mentioned being on benzos in past but got "really messed up" and does not want to take again.  No suicidal ideation.   Discussed gradual cessation which he will consider

## 2019-02-13 NOTE — Patient Instructions (Signed)
Good to see you today!  Thanks for coming in.  I'm glad the swelling is better  Keep taking all your medications  It is vital that you stop alcohol.   If you would like resources or assistance call me.  My cell is [phone].    [employer] is an excellent organization   If you choose to stop on your own then cut down over a week to two weeks.  I will call you about your blood tests

## 2019-02-13 NOTE — Progress Notes (Signed)
Subjective  Omar Ward is a 48 y.o. male is presenting with the following  EDEMA Much improved.  Not wearing support stockings.  Taking indapamide.  No leg pain  LFTS No abdomen pain.  No know liver problems.  Drinks about a quart of vodka a day.   No other illegal drugs.   Has stopped etoh in past when was using with other substances.  Has gone to [place].  Does not feel it would help much.  Has a friend he trusts and could talk to.  Feels shaky if he does not drink but has never had DTs or hallucinations.    DEPRESSION Taking zoloft but does not feel it helps.  Does not enjoy life.  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  Living in trailer behind relative but there is a dispute going on and may have to move   Chief Complaint noted Review of Symptoms - see HPI PMH - Smoking status noted.    Objective Vital Signs reviewed BP 140/80   Pulse 81   Wt 228 lb 6.4 oz (103.6 kg)   SpO2 95%   BMI 33.73 kg/m  Psych:  Cognition and judgment appear intact. Alert, communicative  and cooperative with normal attention span and concentration. No apparent delusions, illusions, hallucinations No edema in legs  Weight has decreased 6 lbs   Assessments/Plans  Depression, recurrent (HCC) Complicated by recently revealed alcoholism.  Continue SSRI.    Alcoholism ([employer]) Very likely cause of his elevated lfts and low plts and elevated MCV.  He will consider stopping.  Does not want to consider detox.  Will contact me if wishes suggestions.  Does not have history of DTs.  He mentioned being on benzos in past but got "really messed up" and does not want to take again.  No suicidal ideation.   Discussed gradual cessation which he will consider    Elevated liver enzymes Very likely due to alcohol intake.  Will check for hepatitis.  Strongly encouraged cessation    See after visit summary for details of  patient instructions

## 2019-02-13 NOTE — Assessment & Plan Note (Signed)
Very likely due to alcohol intake.  Will check for hepatitis.  Strongly encouraged cessation

## 2019-02-14 LAB — CMP14+EGFR
ALT: 101 IU/L — ABNORMAL HIGH (ref 0–44)
AST: 66 IU/L — ABNORMAL HIGH (ref 0–40)
Albumin/Globulin Ratio: 1.8 (ref 1.2–2.2)
Albumin: 4.6 g/dL (ref 4.0–5.0)
Alkaline Phosphatase: 119 IU/L — ABNORMAL HIGH (ref 39–117)
BUN/Creatinine Ratio: 14 (ref 9–20)
BUN: 15 mg/dL (ref 6–24)
Bilirubin Total: 0.3 mg/dL (ref 0.0–1.2)
CO2: 20 mmol/L (ref 20–29)
Calcium: 8.8 mg/dL (ref 8.7–10.2)
Chloride: 100 mmol/L (ref 96–106)
Creatinine, Ser: 1.08 mg/dL (ref 0.76–1.27)
GFR calc Af Amer: 92 mL/min/{1.73_m2} (ref >59)
GFR calc non Af Amer: 80 mL/min/{1.73_m2} (ref >59)
Globulin, Total: 2.5 g/dL (ref 1.5–4.5)
Glucose: 100 mg/dL — ABNORMAL HIGH (ref 65–99)
Potassium: 4.2 mmol/L (ref 3.5–5.2)
Sodium: 138 mmol/L (ref 134–144)
Total Protein: 7.1 g/dL (ref 6.0–8.5)

## 2019-02-14 LAB — HEPATITIS PANEL, ACUTE
Hep A IgM: NEGATIVE
Hep B C IgM: NEGATIVE
Hep C Virus Ab: <0.1 s/co ratio (ref 0.0–0.9)
Hepatitis B Surface Ag: NEGATIVE

## 2019-02-15 ENCOUNTER — Encounter: Payer: Self-pay | Admitting: Family Medicine

## 2019-02-15 DIAGNOSIS — G4733 Obstructive sleep apnea (adult) (pediatric): Secondary | ICD-10-CM | POA: Diagnosis not present

## 2019-02-27 MED FILL — INDAPAMIDE 1.25 MG TABS: 1.25 | 30 days supply | Qty: 30 | Fill #1

## 2019-03-12 NOTE — Progress Notes (Addendum)
PATIENT: Omar Ward DOB: [birth year]  REASON FOR VISIT: follow up HISTORY FROM: patient  Chief Complaint  Patient presents with  . Follow-up    Initial cpap f/u. Alone. Rm 5. No new concerns at this time.      HISTORY OF PRESENT ILLNESS: Today 03/13/19 Omar Ward is a 49 y.o. male here today for follow up for severe OSA on CPAP therapy.  He had a sleep study in September that revealed obstructive sleep apnea AHI of 88 and O2 nadir of 88%.  He reports that he has done very well with CPAP therapy.  He is adjusting to using his machine nightly.  He does state that he notes a leak, specifically when he is sleeping on his side.  He is currently using a fullface mask.  He is unaware of how to check for correct mask seal on his CPAP machine.  He just recently received a new facemask but has not started using it.  He does note significant improvement in how he feels when he uses CPAP therapy.  He is currently working in maintenance at [employer].  He is working third shift.  He usually works 12-hour shifts 7 days a week.  He reports there are some days that he does not sleep.  Compliance report dated 02/10/2019 through 03/11/2019 reveals that he used CPAP 26 of the last 30 days for compliance of 87%.  18 days he used CPAP greater than 4 hours for compliance of 60%.  Average usage was 4 hours and 51 minutes.  Residual AHI was 6.8 on 15 cm of water and an EPR of 3.  There was a significant leak noted in the 95th percentile of 39.7.  HISTORY: (copied from Dr Guadelupe Sabin note on 11/12/2018)  Dear Dr. Erin Hearing, I saw your patient, Omar Ward, upon your kind request in my sleep clinic today for initial consultation of his sleep disorder, in particular, concern for underlying obstructive sleep apnea.  The patient is unaccompanied today.  As you know, [name] is a 49 year old right-hand gentleman with an underlying medical history of hypertension, depression, smoking, and obesity, who reports  snoring and excessive daytime somnolence as well as difficulty initiating and maintaining sleep.  He works third shift, 12 hours, 5 days a week.  He works at [employer] in [place], as a Dealer.  He has been working third shift for the past 2 years.  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  He is trying to quit smoking and would like to try Chantix but recently started Zoloft for his anxiety and depression has been on this for the past 6 weeks or so.  He took care of his mother for about 4 years, she recently passed away.  He is holding up okay, he is also taking care of his elderly aunt.  He lives with his wife, he has 2 grown biological children and 2 stepchildren.  He smokes about a pack a day and drinks alcohol very occasionally, caffeine in the form of coffee, 2 cups/day on average.  His bedtime is around noon and rise time and.  He has a 1 hour commute from [place], [place].  He tries to sleep on his nights off at night.  He has 2 dogs in the household.  He snores loudly and also makes pauses in his breathing frequently as witnessed by his wife.  He also twitches quite a bit and kicks in his sleep as per wife's feedback and  he also feels like he needs to move his legs all the time, endorses restless leg symptoms.  He has woken up with the occasional headache, seems more lately.  His blood pressure numbers have been difficult to control.  He also was told he has hyperlipidemia recently.  I reviewed your office note from 10/31/2018.  I reviewed his lipid panel from 10/31/2018: Total cholesterol was 255, LDL 150, triglycerides 332.  His Epworth sleepiness score is 5 out of 24, fatigue severity score is 41 out of 63.  He does not wake up rested, he goes to the bathroom about 3 times once he goes to bed.  He wakes up with a dry mouth often due to mouth breathing.  His mother had sleep apnea and also needed oxygen, she had COPD.  His brother has sleep apnea and uses a CPAP machine and his  father is suspected to have sleep apnea as I understand.    REVIEW OF SYSTEMS: Out of a complete 14 system review of symptoms, the patient complains only of the following symptoms,insomnia, headaches and all other reviewed systems are negative.  Epworth sleepiness scale: 6 Fatigue severity scale: 12  ALLERGIES: Allergies  Allergen Reactions  . Codeine Nausea Only    HOME MEDICATIONS: Outpatient Medications Prior to Visit  Medication Sig Dispense Refill  . indapamide (LOZOL) 1.25 MG tablet Take 1 tablet (1.25 mg total) by mouth daily. 30 tablet 1  . sertraline (ZOLOFT) 100 MG tablet Take 1 tablet (100 mg total) by mouth daily. 90 tablet 1   No facility-administered medications prior to visit.    PAST MEDICAL HISTORY: Past Medical History:  Diagnosis Date  . Hernia, abdominal     PAST SURGICAL HISTORY: Past Surgical History:  Procedure Laterality Date  . Knee arthrocopy       FAMILY HISTORY: Family History  Problem Relation Age of Onset  . Diabetes Mother   . Hypertension Father   . Diabetes Father     SOCIAL HISTORY: Social History   Socioeconomic History  . Marital status: Married    Spouse name: Not on file  . Number of children: Not on file  . Years of education: Not on file  . Highest education level: Not on file  Occupational History  . Not on file  Tobacco Use  . Smoking status: Current Every Day Smoker    Types: Cigarettes  . Smokeless tobacco: Never Used  . Tobacco comment: 1/2 ppd   Substance and Sexual Activity  . Alcohol use: Yes    Alcohol/week: 3.0 - 6.0 standard drinks    Types: 3 - 6 Cans of beer per week  . Drug use: No  . Sexual activity: Not Currently  Other Topics Concern  . Not on file  Social History Narrative   Lives with his second wife.  Has farm animals and likes to work on cars. Likes to fish and hunt.  No regular exercise.   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 Difficulty of Paying Living Expenses: Not on file  Food Insecurity:   . Worried About Programme researcher, broadcasting/film/video in the Last Year: Not on file  . Ran Out of Food in the Last Year: Not on file  Transportation Needs:   . Lack of Transportation (Medical): Not on file  . Lack of Transportation (Non-Medical): Not on file  Physical Activity:   . Days of Exercise per Week: Not on file  . Minutes of Exercise per Session: Not on file  Stress:   . Feeling of Stress : Not on file  Social Connections:   . Frequency of Communication with Friends and Family: Not on file  . Frequency of Social Gatherings with Friends and Family: Not on file  . Attends Religious Services: Not on file  . Active Member of Clubs or Organizations: Not on file  . Attends Banker Meetings: Not on file  . Marital Status: Not on file  Intimate Partner Violence:   . Fear of Current or Ex-Partner: Not on file  . Emotionally Abused: Not on file  . Physically Abused: Not on file  . Sexually Abused: Not on file      PHYSICAL EXAM  Vitals:   03/13/19 0755  BP: 128/84  Pulse: 65  Temp: (!) 97 F (36.1 C)  TempSrc: Oral  Weight: 236 lb 12.8 oz (107.4 kg)  Height: 5\' 9"  (1.753 m)   Body mass index is 34.97 kg/m.  Generalized: Well developed, in no acute distress  Cardiology: normal rate and rhythm, no murmur noted Respiratory: Clear to auscultation bilaterally Neurological examination  Mentation: Alert oriented to time, place, history taking. Follows all commands speech and language fluent Cranial nerve II-XII: Pupils were equal round reactive to light. Extraocular movements were full, visual field were full on confrontational test. Facial sensation and strength were normal.  Motor: The motor testing reveals 5 over 5 strength of all 4 extremities. Good symmetric motor tone is noted throughout.  Gait and station: Gait is normal.     DIAGNOSTIC DATA (LABS, IMAGING, TESTING) - I reviewed patient records, labs, notes, testing and imaging myself where available.  No flowsheet data found.   Lab Results  Component Value Date   WBC 8.9 01/29/2019   HGB 14.6 01/29/2019   HCT 43.2 01/29/2019   MCV 101 (H) 01/29/2019   PLT 152 01/29/2019      Component Value Date/Time   NA 138 02/13/2019 0931   K 4.2 02/13/2019 0931   CL 100 02/13/2019 0931   CO2 20 02/13/2019 0931   GLUCOSE 100 (H) 02/13/2019 0931   GLUCOSE 104 (H) 11/08/2014 1416   BUN 15 02/13/2019 0931   CREATININE 1.08 02/13/2019 0931   CALCIUM 8.8 02/13/2019 0931   PROT 7.1 02/13/2019 0931   ALBUMIN 4.6 02/13/2019 0931   AST 66 (H) 02/13/2019 0931   ALT 101 (H) 02/13/2019 0931   ALKPHOS 119 (H) 02/13/2019 0931   BILITOT 0.3 02/13/2019 0931   GFRNONAA 80 02/13/2019 0931   GFRAA 92 02/13/2019 0931   Lab Results  Component Value Date   CHOL 255 (H) 10/31/2018   HDL 43 10/31/2018   LDLCALC 150 (H) 10/31/2018   TRIG 332 (H) 10/31/2018   CHOLHDL 5.9 (H) 10/31/2018   No results found for: HGBA1C No results found for: VITAMINB12 Lab Results  Component Value Date   TSH 2.380 10/31/2018       ASSESSMENT AND PLAN 49 y.o. year old male  has a past medical history of Hernia, abdominal. here with     ICD-10-CM   1. OSA on CPAP  G47.33    Z99.89     Aster is adjusting to CPAP therapy at home.  He does note significant improvement in energy levels when he is using CPAP therapy.  He continues to adjust and work on mask fitting.  He is  recently received a new mask.  I have demonstrated how to assess for correct fit on his CPAP machine in the office.  He will continue to focus on getting a tight seal.  He was encouraged to continue using CPAP nightly and for greater than 4 hours each night.  He does work night shift which has complicated compliance as his sleep schedule alternates from day-to-day.  He is also working closely with primary care.  He has  recently stopped drinking alcohol due to elevated liver enzymes.  He feels that he is doing well with alcohol cessation.  He will continue to follow-up closely.  We will see him back in 3 months.  He verbalizes understanding and agreement with this plan.   No orders of the defined types were placed in this encounter.    No orders of the defined types were placed in this encounter.     I spent 15 minutes with the patient. 50% of this time was spent counseling and educating patient on plan of care and medications.    Shawnie Dapper, FNP-C 03/13/2019, 8:39 AM [facility] [address], [address] [place], [place] [address] [phone]  I reviewed the above note and documentation by the Nurse Practitioner and agree with the history, exam, assessment and plan as outlined above. I was available for consultation. Huston Foley, MD, PhD [facility])

## 2019-03-13 ENCOUNTER — Encounter: Payer: Self-pay | Admitting: Family Medicine

## 2019-03-13 ENCOUNTER — Ambulatory Visit (INDEPENDENT_AMBULATORY_CARE_PROVIDER_SITE_OTHER): Payer: 59 | Admitting: Family Medicine

## 2019-03-13 ENCOUNTER — Other Ambulatory Visit: Payer: Self-pay

## 2019-03-13 VITALS — BP 128/84 | HR 65 | Temp 97.0°F | Ht 69.0 in | Wt 236.8 lb

## 2019-03-13 DIAGNOSIS — Z9989 Dependence on other enabling machines and devices: Secondary | ICD-10-CM

## 2019-03-13 DIAGNOSIS — G4733 Obstructive sleep apnea (adult) (pediatric): Secondary | ICD-10-CM | POA: Diagnosis not present

## 2019-03-13 NOTE — Patient Instructions (Signed)
Continue using CPAP nightly and greater than 4 hours each night   Follow up in 3 months   Sleep Apnea Sleep apnea affects breathing during sleep. It causes breathing to stop for a short time or to become shallow. It can also increase the risk of:  Heart attack.  Stroke.  Being very overweight (obese).  Diabetes.  Heart failure.  Irregular heartbeat. The goal of treatment is to help you breathe normally again. What are the causes? There are three kinds of sleep apnea:  Obstructive sleep apnea. This is caused by a blocked or collapsed airway.  Central sleep apnea. This happens when the brain does not send the right signals to the muscles that control breathing.  Mixed sleep apnea. This is a combination of obstructive and central sleep apnea. The most common cause of this condition is a collapsed or blocked airway. This can happen if:  Your throat muscles are too relaxed.  Your tongue and tonsils are too large.  You are overweight.  Your airway is too small. What increases the risk?  Being overweight.  Smoking.  Having a small airway.  Being older.  Being male.  Drinking alcohol.  Taking medicines to calm yourself (sedatives or tranquilizers).  Having family members with the condition. What are the signs or symptoms?  Trouble staying asleep.  Being sleepy or tired during the day.  Getting angry a lot.  Loud snoring.  Headaches in the morning.  Not being able to focus your mind (concentrate).  Forgetting things.  Less interest in sex.  Mood swings.  Personality changes.  Feelings of sadness (depression).  Waking up a lot during the night to pee (urinate).  Dry mouth.  Sore throat. How is this diagnosed?  Your medical history.  A physical exam.  A test that is done when you are sleeping (sleep study). The test is most often done in a sleep lab but may also be done at home. How is this treated?   Sleeping on your side.  Using a  medicine to get rid of mucus in your nose (decongestant).  Avoiding the use of alcohol, medicines to help you relax, or certain pain medicines (narcotics).  Losing weight, if needed.  Changing your diet.  Not smoking.  Using a machine to open your airway while you sleep, such as: ? An oral appliance. This is a mouthpiece that shifts your lower jaw forward. ? A CPAP device. This device blows air through a mask when you breathe out (exhale). ? An EPAP device. This has valves that you put in each nostril. ? A BPAP device. This device blows air through a mask when you breathe in (inhale) and breathe out.  Having surgery if other treatments do not work. It is important to get treatment for sleep apnea. Without treatment, it can lead to:  High blood pressure.  Coronary artery disease.  In men, not being able to have an erection (impotence).  Reduced thinking ability. Follow these instructions at home: Lifestyle  Make changes that your doctor recommends.  Eat a healthy diet.  Lose weight if needed.  Avoid alcohol, medicines to help you relax, and some pain medicines.  Do not use any products that contain nicotine or tobacco, such as cigarettes, e-cigarettes, and chewing tobacco. If you need help quitting, ask your doctor. General instructions  Take over-the-counter and prescription medicines only as told by your doctor.  If you were given a machine to use while you sleep, use it only as told  by your doctor.  If you are having surgery, make sure to tell your doctor you have sleep apnea. You may need to bring your device with you.  Keep all follow-up visits as told by your doctor. This is important. Contact a doctor if:  The machine that you were given to use during sleep bothers you or does not seem to be working.  You do not get better.  You get worse. Get help right away if:  Your chest hurts.  You have trouble breathing in enough air.  You have an uncomfortable  feeling in your back, arms, or stomach.  You have trouble talking.  One side of your body feels weak.  A part of your face is hanging down. These symptoms may be an emergency. Do not wait to see if the symptoms will go away. Get medical help right away. Call your local emergency services (911 in the [place] Do not drive yourself to the hospital. Summary  This condition affects breathing during sleep.  The most common cause is a collapsed or blocked airway.  The goal of treatment is to help you breathe normally while you sleep. This information is not intended to replace advice given to you by your health care provider. Make sure you discuss any questions you have with your health care provider. Document Revised: 12/01/2017 Document Reviewed: 10/10/2017 Elsevier Patient Education  [place].

## 2019-03-18 DIAGNOSIS — G4733 Obstructive sleep apnea (adult) (pediatric): Secondary | ICD-10-CM | POA: Diagnosis not present

## 2019-04-01 ENCOUNTER — Other Ambulatory Visit: Payer: Self-pay | Admitting: Family Medicine

## 2019-04-01 DIAGNOSIS — I10 Essential (primary) hypertension: Secondary | ICD-10-CM

## 2019-04-01 MED FILL — INDAPAMIDE 1.25 MG TABS: 1.25 | 30 days supply | Qty: 30 | Fill #0

## 2019-04-17 ENCOUNTER — Telehealth (INDEPENDENT_AMBULATORY_CARE_PROVIDER_SITE_OTHER): Payer: 59 | Admitting: Family Medicine

## 2019-04-17 ENCOUNTER — Other Ambulatory Visit: Payer: Self-pay

## 2019-04-17 DIAGNOSIS — I10 Essential (primary) hypertension: Secondary | ICD-10-CM

## 2019-04-17 DIAGNOSIS — F339 Major depressive disorder, recurrent, unspecified: Secondary | ICD-10-CM | POA: Diagnosis not present

## 2019-04-17 DIAGNOSIS — R748 Abnormal levels of other serum enzymes: Secondary | ICD-10-CM

## 2019-04-17 DIAGNOSIS — F1020 Alcohol dependence, uncomplicated: Secondary | ICD-10-CM

## 2019-04-17 MED ORDER — SERTRALINE HCL 100 MG PO TABS: 100.0000 mg | ORAL_TABLET | Freq: Every day | ORAL | 1 refills | Status: DC

## 2019-04-17 MED ORDER — INDAPAMIDE 1.25 MG PO TABS: 1.2500 mg | ORAL_TABLET | Freq: Every day | ORAL | 1 refills | Status: DC

## 2019-04-17 NOTE — Progress Notes (Signed)
[facility] Telemedicine Visit  Patient consented to have virtual visit. Method of visit: Telephone  Encounter participants: Patient: Omar Ward - located at home Provider: Carney Living - located at office Others (if applicable): no  Chief Complaint: Follow up  HYPERTENSION Home BP Monitoring readings: feels they are better can't recall specific reading  Chest pain- no      Medications   Compliance-  Taking indapamide daily. Lightheadedness-  no  Edema- none   ETOHism Reports has stopped completely about 2 weeks after last visit. No withdrawal feelings.  Feels much improved overall.  No RUQ pain or jaundice SLEEP APNEA Using CPAP more and tolerating better.  Can sleep at least 4-5 hours at a time.  Feels more restec   ROS: per HPI  Pertinent PMHx: substance use  Exam:  Respiratory: normal Psych:  Cognition and judgment appear intact. Alert, communicative  and cooperative with normal attention span and concentration. No apparent delusions, illusions, hallucinations   Assessment/Plan:  Alcoholism (HCC) Improved by his report.  No longer drinking   Elevated liver enzymes Most likely due to alcohol which he reports has stopped. He is asymptomatic.  Recommend recheck labs in 2-3 months   Essential hypertension Reportedly good control.  Continue treatment and home monitoring     Time spent during visit with patient: 13 minutes

## 2019-04-17 NOTE — Assessment & Plan Note (Signed)
Most likely due to alcohol which he reports has stopped. He is asymptomatic.  Recommend recheck labs in 2-3 months

## 2019-04-17 NOTE — Assessment & Plan Note (Signed)
Reportedly good control.  Continue treatment and home monitoring

## 2019-04-17 NOTE — Assessment & Plan Note (Signed)
Improved by his report.  No longer drinking

## 2019-04-18 DIAGNOSIS — G4733 Obstructive sleep apnea (adult) (pediatric): Secondary | ICD-10-CM | POA: Diagnosis not present

## 2019-05-06 MED FILL — INDAPAMIDE 1.25 MG TABS: 1.25 | 90 days supply | Qty: 90 | Fill #0

## 2019-06-12 NOTE — Progress Notes (Deleted)
PATIENT: Omar Ward DOB: 1970/08/01  REASON FOR VISIT: follow up HISTORY FROM: patient  No chief complaint on file.    HISTORY OF PRESENT ILLNESS: Today 06/12/19 Omar Ward is a 49 y.o. male here today for follow up.    HISTORY: (copied from my note on 03/13/2019)  Omar Ward is a 49 y.o. male here today for follow up for severe OSA on CPAP therapy.  He had a sleep study in September that revealed obstructive sleep apnea AHI of 88 and O2 nadir of 88%.  He reports that he has done very well with CPAP therapy.  He is adjusting to using his machine nightly.  He does state that he notes a leak, specifically when he is sleeping on his side.  He is currently using a fullface mask.  He is unaware of how to check for correct mask seal on his CPAP machine.  He just recently received a new facemask but has not started using it.  He does note significant improvement in how he feels when he uses CPAP therapy.  He is currently working in maintenance at [employer].  He is working third shift.  He usually works 12-hour shifts 7 days a week.  He reports there are some days that he does not sleep.  Compliance report dated 02/10/2019 through 03/11/2019 reveals that he used CPAP 26 of the last 30 days for compliance of 87%.  18 days he used CPAP greater than 4 hours for compliance of 60%.  Average usage was 4 hours and 51 minutes.  Residual AHI was 6.8 on 15 cm of water and an EPR of 3.  There was a significant leak noted in the 95th percentile of 39.7.  HISTORY: (copied from Dr Teofilo Pod note on 11/12/2018)  Dear Dr. Deirdre Priest, I saw your patient, Omar Ward, upon your kind request in my sleep clinic today for initial consultation of his sleep disorder, in particular, concern for underlying obstructive sleep apnea. The patient is unaccompanied today. As you know, Omar Ward is a 49 year old right-hand gentleman with an underlying medical history of hypertension, depression, smoking, and  obesity, who reports snoring and excessive daytime somnolence as well as difficulty initiating and maintaining sleep. He works third shift, 12 hours, 5 days a week. He works at [employer] in [place], as a Curator. He has been working third shift for the past 2 years. 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 He is trying to quit smoking and would like to try Chantix but recently started Zoloft for his anxiety and depression has been on this for the past 6 weeks or so. He took care of his mother for about 4 years, she recently passed away. He is holding up okay, he is also taking care of his elderly aunt. He lives with his wife, he has 2 grown biological children and 2 stepchildren. He smokes about a pack a day and drinks alcohol very occasionally, caffeine in the form of coffee, 2 cups/day on average. His bedtime is around noon and rise time and. He has a 1 hour commute from [place], [place]. He tries to sleep on his nights off at night. He has 2 dogs in the household. He snores loudly and also makes pauses in his breathing frequently as witnessed by his wife. He also twitches quite a bit and kicks in his sleep as per wife's feedback and he also feels like he needs to move his legs all the time, endorses  restless leg symptoms. He has woken up with the occasional headache, seems more lately. His blood pressure numbers have been difficult to control. He also was told he has hyperlipidemia recently. I reviewed your office note from 10/31/2018. I reviewed his lipid panel from 10/31/2018: Total cholesterol was 255, LDL 150, triglycerides 332. His Epworth sleepiness score is 5 out of 24, fatigue severity score is 41 out of 63. He does not wake up rested, he goes to the bathroom about 3 times once he goes to bed. He wakes up with a dry mouth often due to mouth breathing. His mother had sleep apnea and also needed oxygen, she had COPD. His brother has sleep apnea and uses a  CPAP machine and his father is suspected to have sleep apnea as I understand.   REVIEW OF SYSTEMS: Out of a complete 14 system review of symptoms, the patient complains only of the following symptoms, and all other reviewed systems are negative.  ALLERGIES: Allergies  Allergen Reactions  . Codeine Nausea Only    HOME MEDICATIONS: Outpatient Medications Prior to Visit  Medication Sig Dispense Refill  . indapamide (LOZOL) 1.25 MG tablet Take 1 tablet (1.25 mg total) by mouth daily. 90 tablet 1  . sertraline (ZOLOFT) 100 MG tablet Take 1 tablet (100 mg total) by mouth daily. 90 tablet 1   No facility-administered medications prior to visit.    PAST MEDICAL HISTORY: Past Medical History:  Diagnosis Date  . Hernia, abdominal     PAST SURGICAL HISTORY: Past Surgical History:  Procedure Laterality Date  . Knee arthrocopy       FAMILY HISTORY: Family History  Problem Relation Age of Onset  . Diabetes Mother   . Hypertension Father   . Diabetes Father     SOCIAL HISTORY: Social History   Socioeconomic History  . Marital status: Married    Spouse name: Not on file  . Number of children: Not on file  . Years of education: Not on file  . Highest education level: Not on file  Occupational History  . Not on file  Tobacco Use  . Smoking status: Current Every Day Smoker    Types: Cigarettes  . Smokeless tobacco: Never Used  . Tobacco comment: 1/2 ppd   Substance and Sexual Activity  . Alcohol use: Yes    Alcohol/week: 3.0 - 6.0 standard drinks    Types: 3 - 6 Cans of beer per week  . Drug use: No  . Sexual activity: Not Currently  Other Topics Concern  . Not on file  Social History Narrative   Lives with his second wife.  Has farm animals and likes to work on cars. Likes to fish and hunt.  No regular exercise.   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 Difficulty of Paying Living Expenses:   Food Insecurity:   . Worried About Charity fundraiser in the Last Year:   . Arboriculturist in the Last Year:   Transportation Needs:   . Film/video editor (Medical):   Marland Kitchen Lack of Transportation (Non-Medical):   Physical Activity:   . Days of Exercise per Week:   . Minutes of Exercise per Session:   Stress:   . Feeling of Stress :   Social Connections:   . Frequency of Communication with Friends and Family:   .  Frequency of Social Gatherings with Friends and Family:   . Attends Religious Services:   . Active Member of Clubs or Organizations:   . Attends Banker Meetings:   Marland Kitchen Marital Status:   Intimate Partner Violence:   . Fear of Current or Ex-Partner:   . Emotionally Abused:   Marland Kitchen Physically Abused:   . Sexually Abused:       PHYSICAL EXAM  There were no vitals filed for this visit. There is no height or weight on file to calculate BMI.  Generalized: Well developed, in no acute distress  Cardiology: normal rate and rhythm, no murmur noted Neurological examination  Mentation: Alert oriented to time, place, history taking. Follows all commands speech and language fluent Cranial nerve II-XII: Pupils were equal round reactive to light. Extraocular movements were full, visual field were full on confrontational test. Facial sensation and strength were normal. Uvula tongue midline. Head turning and shoulder shrug  were normal and symmetric. Motor: The motor testing reveals 5 over 5 strength of all 4 extremities. Good symmetric motor tone is noted throughout.  Sensory: Sensory testing is intact to soft touch on all 4 extremities. No evidence of extinction is noted.  Coordination: Cerebellar testing reveals good finger-nose-finger and heel-to-shin bilaterally.  Gait and station: Gait is normal. Tandem gait is normal. Romberg is negative. No drift is seen.  Reflexes: Deep tendon reflexes are symmetric and normal  bilaterally.   DIAGNOSTIC DATA (LABS, IMAGING, TESTING) - I reviewed patient records, labs, notes, testing and imaging myself where available.  No flowsheet data found.   Lab Results  Component Value Date   WBC 8.9 01/29/2019   HGB 14.6 01/29/2019   HCT 43.2 01/29/2019   MCV 101 (H) 01/29/2019   PLT 152 01/29/2019      Component Value Date/Time   NA 138 02/13/2019 0931   K 4.2 02/13/2019 0931   CL 100 02/13/2019 0931   CO2 20 02/13/2019 0931   GLUCOSE 100 (H) 02/13/2019 0931   GLUCOSE 104 (H) 11/08/2014 1416   BUN 15 02/13/2019 0931   CREATININE 1.08 02/13/2019 0931   CALCIUM 8.8 02/13/2019 0931   PROT 7.1 02/13/2019 0931   ALBUMIN 4.6 02/13/2019 0931   AST 66 (H) 02/13/2019 0931   ALT 101 (H) 02/13/2019 0931   ALKPHOS 119 (H) 02/13/2019 0931   BILITOT 0.3 02/13/2019 0931   GFRNONAA 80 02/13/2019 0931   GFRAA 92 02/13/2019 0931   Lab Results  Component Value Date   CHOL 255 (H) 10/31/2018   HDL 43 10/31/2018   LDLCALC 150 (H) 10/31/2018   TRIG 332 (H) 10/31/2018   CHOLHDL 5.9 (H) 10/31/2018   No results found for: HGBA1C No results found for: VITAMINB12 Lab Results  Component Value Date   TSH 2.380 10/31/2018       ASSESSMENT AND PLAN 49 y.o. year old male  has a past medical history of Hernia, abdominal. here with ***  No diagnosis found.     No orders of the defined types were placed in this encounter.    No orders of the defined types were placed in this encounter.     I spent 15 minutes with the patient. 50% of this time was spent counseling and educating patient on plan of care and medications.    Shawnie Dapper, FNP-C 06/12/2019, 4:19 PM [facility] [address], [address] [place], [place] [address] [phone]

## 2019-06-13 ENCOUNTER — Ambulatory Visit: Payer: 59 | Admitting: Family Medicine

## 2019-06-13 ENCOUNTER — Encounter: Payer: Self-pay | Admitting: Family Medicine

## 2019-06-13 ENCOUNTER — Telehealth: Payer: Self-pay | Admitting: *Deleted

## 2019-06-13 NOTE — Telephone Encounter (Signed)
Patient was no show for follow up with NP today.  

## 2019-06-25 ENCOUNTER — Other Ambulatory Visit: Payer: Self-pay

## 2019-06-25 DIAGNOSIS — F339 Major depressive disorder, recurrent, unspecified: Secondary | ICD-10-CM

## 2019-06-25 MED ORDER — SERTRALINE HCL 100 MG PO TABS: 100.0000 mg | ORAL_TABLET | Freq: Every day | ORAL | 1 refills | Status: DC

## 2019-07-02 ENCOUNTER — Encounter: Payer: Self-pay | Admitting: Family Medicine

## 2019-07-02 ENCOUNTER — Ambulatory Visit (INDEPENDENT_AMBULATORY_CARE_PROVIDER_SITE_OTHER): Payer: 59 | Admitting: Family Medicine

## 2019-07-02 ENCOUNTER — Other Ambulatory Visit: Payer: Self-pay

## 2019-07-02 ENCOUNTER — Other Ambulatory Visit (HOSPITAL_COMMUNITY): Payer: Self-pay | Admitting: Family Medicine

## 2019-07-02 DIAGNOSIS — R748 Abnormal levels of other serum enzymes: Secondary | ICD-10-CM

## 2019-07-02 DIAGNOSIS — F1020 Alcohol dependence, uncomplicated: Secondary | ICD-10-CM | POA: Diagnosis not present

## 2019-07-02 DIAGNOSIS — F339 Major depressive disorder, recurrent, unspecified: Secondary | ICD-10-CM | POA: Diagnosis not present

## 2019-07-02 DIAGNOSIS — I10 Essential (primary) hypertension: Secondary | ICD-10-CM

## 2019-07-02 DIAGNOSIS — G4730 Sleep apnea, unspecified: Secondary | ICD-10-CM

## 2019-07-02 DIAGNOSIS — F17200 Nicotine dependence, unspecified, uncomplicated: Secondary | ICD-10-CM | POA: Diagnosis not present

## 2019-07-02 MED ORDER — SERTRALINE HCL 100 MG PO TABS: 150.0000 mg | ORAL_TABLET | Freq: Every day | ORAL | 1 refills | Status: DC

## 2019-07-02 MED ORDER — FLUTICASONE PROPIONATE 50 MCG/ACT NA SUSP: 2.0000 | Freq: Every day | NASAL | 6 refills | Status: DC

## 2019-07-02 MED FILL — SERTRALINE HCL 100 MG TAB: 100 | 90 days supply | Qty: 135 | Fill #0

## 2019-07-02 MED FILL — FLUTICASONE PROP 50 MCG SPR: 50 | 30 days supply | Qty: 16 | Fill #0

## 2019-07-02 NOTE — Assessment & Plan Note (Signed)
Briefly discussed options and the benefits of quitting. This is an ongoing discussion. Will revisit in 3 months or earlier if he would like to try quitting.

## 2019-07-02 NOTE — Progress Notes (Signed)
Patient given PHQ9 per provider.  Glennie Hawk, CMA'

## 2019-07-02 NOTE — Assessment & Plan Note (Signed)
BP looks good today. Continue on current dose of indapamine

## 2019-07-02 NOTE — Patient Instructions (Signed)
Good to see you today!  Thanks for coming in.  Will increase the sertraline to 150 mg a day  Start the flonase twice a day with the zyrtec.  If the allegies are not better in 2 weeks then call me   I will call you if your tests are not good.  Otherwise I will send you a letter.  If you do not hear from me with in 2 weeks please call our office.     Consider slowing down with the cigarettes  Call your neurologist if not able to wear Cpap   Come back in 3 months or sooner if want ot discuss stopping smoking

## 2019-07-02 NOTE — Progress Notes (Addendum)
    SUBJECTIVE:   CHIEF COMPLAINT / HPI:   Depression/anxiety: He endorses an increase in depression symptoms and anxiety over time. He lost his mom last year and mood has worsened more since he quit drinking. He has tolerated sertraline well with no side effects, but feels like there is room for improvement. His thoughts are keeping him up and interfering with sleep. He has been out of sertraline for the past 3 days. No SI/HI  Sleep: He is having trouble sleeping. He works 3rd shift and is having trouble sleeping in daylight. He is trying to get switched to 1st shift but is unsure when that will happen. He currently gets 3 hours of sleep everyday. As mentioned above, he states that his thoughts are keeping him up and interfering with his ability to fall asleep. Additionally, he has not been able to use his CPAP in 1 month as he is congested from allergies and it makes him feel like he is suffocating.   Allergies: He endorses seasonal allergies to the pollen. He is currently very congested which is keeping him from being able to use his CPAP. Zyrtec helps his symptoms but he takes it inconsistently.  HTN: indapamine works well for him, no edema or lightheadedness. He is feeling well. Taking his blood pressures regularly. Cannot remember measurements but states they have been "good." Today he is 135/80.  Alcohol Use Disorder: Stopped drinking alcohol completely after his last in person visit in in December 2020. Has not relapsed since.  PERTINENT  PMH / PSH: Depression, HTN, sleep apnea, tobacco use disorder, elevated liver enzymes and Alcoholism  OBJECTIVE:   BP 135/80   Pulse 69   Ht 5\' 9"  (1.753 m)   Wt 232 lb (105.2 kg)   SpO2 97%   BMI 34.26 kg/m   Physical Exam  Constitutional: He is well-developed, well-nourished, and in no distress.  Eyes: Conjunctivae are normal.  Cardiovascular: Normal rate, regular rhythm and normal heart sounds.  Pulmonary/Chest: Effort normal. He has  wheezes.  Diffuse end-expiratory wheezing bilaterally   Skin: He is not diaphoretic.     ASSESSMENT/PLAN:   Essential hypertension BP looks good today. Continue on current dose of indapamine  Depression, recurrent (HCC) Increasing sertraline to 150mg  daily. Check back in 3 months to see how is doing on new dose  Sleep apnea Prescribing Flonase and recommending daily zyrtec during allergy season. This should decrease his congestion and allow him to use his CPAP again. Counseled on using CPAP daily. This may improve his sleep as well.  Tobacco use disorder Briefly discussed options and the benefits of quitting. This is an ongoing discussion. Will revisit in 3 months or earlier if he would like to try quitting.  Elevated liver enzymes He has stopped drinking since our last in person visit. Will recheck liver enzymes today.  Alcoholism (HCC) Stable by his report.  Will continue to monitor    Sleep Improving control of his depression and improving his allergies to allow him to use his CPAP should improve sleep. Switching to 1st shift so that he can sleep during the night will also help. Follow up in 3 months to see how he is doing.  , MD [facility]    I was present during the relevant parts of history and exam.  I agree with the above

## 2019-07-02 NOTE — Assessment & Plan Note (Addendum)
Prescribing Flonase and recommending daily zyrtec during allergy season. This should decrease his congestion and allow him to use his CPAP again. Counseled on using CPAP daily. This may improve his sleep as well.

## 2019-07-02 NOTE — Assessment & Plan Note (Signed)
He has stopped drinking since our last in person visit. Will recheck liver enzymes today.

## 2019-07-02 NOTE — Assessment & Plan Note (Signed)
Increasing sertraline to 150mg  daily. Check back in 3 months to see how is doing on new dose

## 2019-07-02 NOTE — Assessment & Plan Note (Signed)
Stable by his report.  Will continue to monitor

## 2019-07-03 ENCOUNTER — Encounter: Payer: Self-pay | Admitting: Family Medicine

## 2019-07-03 LAB — CMP14+EGFR
ALT: 46 IU/L — ABNORMAL HIGH (ref 0–44)
AST: 30 IU/L (ref 0–40)
Albumin/Globulin Ratio: 2.1 (ref 1.2–2.2)
Albumin: 4.1 g/dL (ref 4.0–5.0)
Alkaline Phosphatase: 80 IU/L (ref 39–117)
BUN/Creatinine Ratio: 14 (ref 9–20)
BUN: 12 mg/dL (ref 6–24)
Bilirubin Total: <0.2 mg/dL (ref 0.0–1.2)
CO2: 21 mmol/L (ref 20–29)
Calcium: 9 mg/dL (ref 8.7–10.2)
Chloride: 104 mmol/L (ref 96–106)
Creatinine, Ser: 0.87 mg/dL (ref 0.76–1.27)
GFR calc Af Amer: 116 mL/min/{1.73_m2} (ref >59)
GFR calc non Af Amer: 101 mL/min/{1.73_m2} (ref >59)
Globulin, Total: 2 g/dL (ref 1.5–4.5)
Glucose: 120 mg/dL — ABNORMAL HIGH (ref 65–99)
Potassium: 3.9 mmol/L (ref 3.5–5.2)
Sodium: 140 mmol/L (ref 134–144)
Total Protein: 6.1 g/dL (ref 6.0–8.5)

## 2019-07-23 DIAGNOSIS — G4733 Obstructive sleep apnea (adult) (pediatric): Secondary | ICD-10-CM | POA: Diagnosis not present

## 2019-09-03 MED FILL — INDAPAMIDE 1.25 MG TABS: 1.25 | 90 days supply | Qty: 90 | Fill #1

## 2019-11-30 DIAGNOSIS — Z6831 Body mass index (BMI) 31.0-31.9, adult: Secondary | ICD-10-CM | POA: Diagnosis not present

## 2019-11-30 DIAGNOSIS — Z1159 Encounter for screening for other viral diseases: Secondary | ICD-10-CM | POA: Diagnosis not present

## 2019-11-30 DIAGNOSIS — R0789 Other chest pain: Secondary | ICD-10-CM | POA: Diagnosis not present

## 2019-11-30 DIAGNOSIS — E782 Mixed hyperlipidemia: Secondary | ICD-10-CM | POA: Diagnosis not present

## 2019-11-30 DIAGNOSIS — R1010 Upper abdominal pain, unspecified: Secondary | ICD-10-CM | POA: Diagnosis not present

## 2019-11-30 DIAGNOSIS — R519 Headache, unspecified: Secondary | ICD-10-CM | POA: Diagnosis not present

## 2019-11-30 DIAGNOSIS — R10819 Abdominal tenderness, unspecified site: Secondary | ICD-10-CM | POA: Diagnosis not present

## 2019-11-30 DIAGNOSIS — R079 Chest pain, unspecified: Secondary | ICD-10-CM | POA: Diagnosis not present

## 2019-11-30 DIAGNOSIS — R0602 Shortness of breath: Secondary | ICD-10-CM | POA: Diagnosis not present

## 2019-11-30 DIAGNOSIS — E6609 Other obesity due to excess calories: Secondary | ICD-10-CM | POA: Diagnosis not present

## 2019-11-30 DIAGNOSIS — I10 Essential (primary) hypertension: Secondary | ICD-10-CM | POA: Diagnosis not present

## 2019-11-30 DIAGNOSIS — F17210 Nicotine dependence, cigarettes, uncomplicated: Secondary | ICD-10-CM | POA: Diagnosis not present

## 2019-11-30 DIAGNOSIS — Z20822 Contact with and (suspected) exposure to covid-19: Secondary | ICD-10-CM | POA: Diagnosis not present

## 2019-12-01 DIAGNOSIS — R10819 Abdominal tenderness, unspecified site: Secondary | ICD-10-CM | POA: Diagnosis not present

## 2019-12-01 DIAGNOSIS — I10 Essential (primary) hypertension: Secondary | ICD-10-CM | POA: Diagnosis not present

## 2019-12-01 DIAGNOSIS — Z6831 Body mass index (BMI) 31.0-31.9, adult: Secondary | ICD-10-CM | POA: Diagnosis not present

## 2019-12-01 DIAGNOSIS — R079 Chest pain, unspecified: Secondary | ICD-10-CM | POA: Diagnosis not present

## 2019-12-01 DIAGNOSIS — F17210 Nicotine dependence, cigarettes, uncomplicated: Secondary | ICD-10-CM | POA: Diagnosis not present

## 2019-12-01 DIAGNOSIS — E6609 Other obesity due to excess calories: Secondary | ICD-10-CM | POA: Diagnosis not present

## 2019-12-01 DIAGNOSIS — Z20822 Contact with and (suspected) exposure to covid-19: Secondary | ICD-10-CM | POA: Diagnosis not present

## 2019-12-01 DIAGNOSIS — E782 Mixed hyperlipidemia: Secondary | ICD-10-CM | POA: Diagnosis not present

## 2019-12-01 DIAGNOSIS — R519 Headache, unspecified: Secondary | ICD-10-CM | POA: Diagnosis not present

## 2019-12-01 DIAGNOSIS — R0789 Other chest pain: Secondary | ICD-10-CM | POA: Diagnosis not present

## 2019-12-02 ENCOUNTER — Other Ambulatory Visit (HOSPITAL_COMMUNITY): Payer: Self-pay | Admitting: Family

## 2019-12-02 DIAGNOSIS — I10 Essential (primary) hypertension: Secondary | ICD-10-CM | POA: Diagnosis not present

## 2019-12-02 DIAGNOSIS — Z6831 Body mass index (BMI) 31.0-31.9, adult: Secondary | ICD-10-CM | POA: Diagnosis not present

## 2019-12-02 DIAGNOSIS — R10819 Abdominal tenderness, unspecified site: Secondary | ICD-10-CM | POA: Diagnosis not present

## 2019-12-02 DIAGNOSIS — R0789 Other chest pain: Secondary | ICD-10-CM | POA: Diagnosis not present

## 2019-12-02 DIAGNOSIS — E782 Mixed hyperlipidemia: Secondary | ICD-10-CM | POA: Diagnosis not present

## 2019-12-02 DIAGNOSIS — F17210 Nicotine dependence, cigarettes, uncomplicated: Secondary | ICD-10-CM | POA: Diagnosis not present

## 2019-12-02 DIAGNOSIS — E6609 Other obesity due to excess calories: Secondary | ICD-10-CM | POA: Diagnosis not present

## 2019-12-02 DIAGNOSIS — R519 Headache, unspecified: Secondary | ICD-10-CM | POA: Diagnosis not present

## 2019-12-02 DIAGNOSIS — R079 Chest pain, unspecified: Secondary | ICD-10-CM | POA: Diagnosis not present

## 2019-12-02 DIAGNOSIS — Z20822 Contact with and (suspected) exposure to covid-19: Secondary | ICD-10-CM | POA: Diagnosis not present

## 2019-12-03 ENCOUNTER — Other Ambulatory Visit: Payer: Self-pay

## 2019-12-03 ENCOUNTER — Ambulatory Visit (INDEPENDENT_AMBULATORY_CARE_PROVIDER_SITE_OTHER): Payer: 59 | Admitting: Family Medicine

## 2019-12-03 DIAGNOSIS — I10 Essential (primary) hypertension: Secondary | ICD-10-CM

## 2019-12-03 DIAGNOSIS — F339 Major depressive disorder, recurrent, unspecified: Secondary | ICD-10-CM

## 2019-12-03 DIAGNOSIS — R079 Chest pain, unspecified: Secondary | ICD-10-CM | POA: Diagnosis not present

## 2019-12-03 MED FILL — ATORVASTATIN 40 MG TABLET: 40 | 30 days supply | Qty: 30 | Fill #0 | Status: TO

## 2019-12-03 MED FILL — ATORVASTATIN 40 MG TABLET: 40 | 30 days supply | Qty: 30 | Fill #0

## 2019-12-03 MED FILL — NITROGLYCERIN 0.4 MG TAB SL: 0.4 | 10 days supply | Qty: 25 | Fill #0

## 2019-12-03 MED FILL — VALSARTAN-HCTZ 160-12.5 MG: 160-12.5 | 30 days supply | Qty: 30 | Fill #0

## 2019-12-03 MED FILL — NITROGLYCERIN 0.4 MG TAB SL: 0.4 | 10 days supply | Qty: 25 | Fill #0 | Status: TO

## 2019-12-03 MED FILL — ASPIRIN 81 MG TBEC: 81 | 30 days supply | Qty: 30 | Fill #0 | Status: TO

## 2019-12-03 MED FILL — NICOTINE 21 MG/24HR PATCH: 21 | 28 days supply | Qty: 28 | Fill #0 | Status: TO

## 2019-12-03 MED FILL — NICOTINE 21 MG/24HR PATCH: 21 | 28 days supply | Qty: 28 | Fill #0

## 2019-12-03 MED FILL — ASPIRIN LOW DOSE 81 MG TBEC: 81 | 30 days supply | Qty: 30 | Fill #0

## 2019-12-03 MED FILL — VALSARTAN-HCTZ 160-12.5 MG: 160-12.5 | 30 days supply | Qty: 30 | Fill #0 | Status: TO

## 2019-12-03 NOTE — Patient Instructions (Addendum)
Good to see you today!  Thanks for coming in.  For the blood pressure   Please bring your blood pressure cuff in next visit  Start the new medication today  For the Chest pain  If severe or sudden then call 911  You can try a nitroglycerin under your tongue  We will refer you to cardiology - if you do not hear from them in one  week let me know  For the Night Sweats  See if they are worsening or getting better  Call your counselor and get this restarted  Come back in 2-4 weeks to check your blood pressure and weight

## 2019-12-04 NOTE — Assessment & Plan Note (Addendum)
PHQ-9 of 16 today, no SI. Endorses significant anxiety disrupting sleep in light of stressors, with poor sleep chronically.   -Patient to call counselor and get re-started -continue on Zoloft

## 2019-12-04 NOTE — Assessment & Plan Note (Addendum)
BP of 140/92 today. Patient discharged on Diovan-Hydrochlorothiazide 160-12.5 mg daily; plans to pick up today. Reporting erratic bp readings at home prior to admission. Of note, patient has hx of sleep apnea, and is not using CPAP due to finding uncomfortable, frequently adjusting.   -Patient advised to bring in blood pressure cuff to next visit -Start new medication today -Advised on CPAP use Unclear if he is still taking indapamide - need to review all medications (hopefully will bring in) next visit

## 2019-12-04 NOTE — Progress Notes (Signed)
SUBJECTIVE:   CHIEF COMPLAINT / HPI: Patient presents for hospital f/u  Chest pain Patient recently discharged following workup for chest pain; presented to [facility] ED on 10/2. Patient had not been feeling well, with pressure sensation on his chest, diaphoresis, and tingling down the left arm. Accelerated HTN noted, with adjustments to HTN medications. Equivocal ST segment changes in inferior leads was noted on exercise treadmill test (no chest pain, had accelerated HTN with minimal activity). Patient was started on daily aspirin 81 mg, Diovan--Hydrochlorothiazide 160-12.5mg  daily, and Lipitor 40 mg. Additionally, discharged on PRN nitroglycerin; and daily nicotine patch.   Today patient reports he had been having intermittent sensation of chest pressure chronically (past 6 months) lasting ~56mins, two to three times a week; that worsened with deep breaths. Additionally conveys has had worsening shortness of breath. Endorses rare productive cough.    HTN Patient previously on Indapamine. Discharged on Diovan-Hydrochlorothiazide 160-12.5 mg daily. He has not yet picked up his medications; plans to do so today. Reports erratic, significantly elevated bp measurements at home prior to hospitalization.  BP Readings from Last 3 Encounters:  12/03/19 (!) 140/92  07/02/19 135/80  03/13/19 128/84    Hyperhydrosis Patient reports increased perspiration during the day and at night in the last month. Not correlated to activity levels. Often wakes up drenched. No fevers. Chest CT with contrast conducted over the course of recent hospitalization showed no abnormal pulmonary opacities, no abnormal thoracic adenopathy, and no adrenal lesions.       Mood Patient reports increased stressors, and associated worrying in taking care of family since his mom passed away last year. Thoughts interfer with sleep. He previously saw a counselor and found that helpful.   PHQ9 SCORE ONLY  12/03/2019 07/02/2019 07/02/2019  PHQ-9 Total Score 16 12 2     Sleep Patient has chronic sleep deprivation. Hx of sleep apnea. He does not use CPAP. Reports congestion and associated feeling of suffocating is no longer the problem; just finds the mask uncomfortable and is having to readjust throughout the night when attempts use.   PERTINENT  PMH / PSH: HTN, Sleep apnea, Tobacco use disorder, Alcohol use disorder, Depression, HLD  OBJECTIVE:   BP (!) 140/92   Pulse 93   Ht 5\' 9"  (1.753 m)   Wt 210 lb (95.3 kg)   SpO2 98%   BMI 31.01 kg/m   General: No acute distress. Pleasant and engaged.  CV: Regular rate and rhythm, no murmur Pulmonary: Normal work of breathing Lymph: No cervicothoracic lymphadenopathy Neuro: Alert and responsive.   ASSESSMENT/PLAN:  HM -COVID vaccine: Patient has received both doses  -Colonoscopy: Refer to GI for colonoscopy following chest pain workup  Chest pain Chronic complaint in this patient with extensive hx of tobacco use, recently hospitalized with primary complaint of this, with subsequent workup. Equivocal ST segment changes in inferior leads was noted on exercise treadmill test, no chest pain, had accelerated HTN with minimal activity. Small hiatal hernia versus slight prominence of the distal esophagus seen on chest CT with contrast. Today, patient additionally reports worsening sob in the past few months.   -Cardiology referral for exercise myocardial perfusion scan per recs -Consider pulmonology eval in future if inconclusive -Evaluate for GERD at next visit -Advised on Nitroglycerin use -Return precautions given   Essential hypertension BP of 140/92 today. Patient discharged on Diovan-Hydrochlorothiazide 160-12.5 mg daily; plans to pick up today. Reporting erratic bp readings at home prior to admission. Of note, patient  has hx of sleep apnea, and is not using CPAP due to finding uncomfortable, frequently adjusting.   -Patient advised to bring  in blood pressure cuff to next visit -Start new medication today -Advised on CPAP use  Depression, recurrent (HCC) PHQ-9 of 16 today, no SI. Endorses significant anxiety disrupting sleep in light of stressors, with poor sleep chronically.   -Patient to call counselor and get re-started -continue on Zoloft    Hyperhydrosis Patient reporting chronic hyperhidrosis (day and night). Often wakes up drenched. Recent Chest CT with contrast showed no abnormal pulmonary opacities, thoracic adenopathy, or adrenal lesions. Patient's anxiety likely contributory. No fevers.  -monitor for improvement with paired evaluation and management of patient's chest pain - Is losing weight by choice he reports.  Need to follow this closely  Greer Ee, Medical Student [place] [facility]   I was present during key history taking and physical exam and any procedures.  I agree with the note above Pauline Good MD

## 2019-12-04 NOTE — Assessment & Plan Note (Addendum)
Chronic complaint in this patient with extensive hx of tobacco use, recently hospitalized with primary complaint of this, with subsequent workup. Equivocal ST segment changes in inferior leads was noted on exercise treadmill test, no chest pain, had accelerated HTN with minimal activity. Small hiatal hernia versus slight prominence of the distal esophagus seen on chest CT with contrast. Today, patient additionally reports worsening sob in the past few months.   -Cardiology referral for exercise myocardial perfusion scan per recs -Consider pulmonology eval in future if inconclusive -Evaluate for GERD at next visit -Advised on Nitroglycerin use -Return precautions given

## 2019-12-10 ENCOUNTER — Telehealth: Payer: Self-pay

## 2019-12-10 NOTE — Telephone Encounter (Signed)
    Patient states he is a current employee of [employer]. He was referred by his supervisor Loman Chroman [employer]) who sent an email to you.  Did you agree to see this patient

## 2019-12-17 NOTE — Telephone Encounter (Signed)
Ok Thx 

## 2019-12-21 ENCOUNTER — Emergency Department (HOSPITAL_COMMUNITY)
Admission: EM | Admit: 2019-12-21 | Discharge: 2019-12-21 | Disposition: A | Discharge status: Home / Self Care | Payer: 59 | Attending: Emergency Medicine | Admitting: Emergency Medicine

## 2019-12-21 ENCOUNTER — Other Ambulatory Visit: Payer: Self-pay

## 2019-12-21 ENCOUNTER — Emergency Department (HOSPITAL_COMMUNITY): Payer: 59

## 2019-12-21 DIAGNOSIS — Z7982 Long term (current) use of aspirin: Secondary | ICD-10-CM | POA: Diagnosis not present

## 2019-12-21 DIAGNOSIS — F17210 Nicotine dependence, cigarettes, uncomplicated: Secondary | ICD-10-CM | POA: Diagnosis not present

## 2019-12-21 DIAGNOSIS — I10 Essential (primary) hypertension: Secondary | ICD-10-CM | POA: Diagnosis not present

## 2019-12-21 DIAGNOSIS — R079 Chest pain, unspecified: Secondary | ICD-10-CM | POA: Diagnosis not present

## 2019-12-21 DIAGNOSIS — R0789 Other chest pain: Secondary | ICD-10-CM | POA: Diagnosis present

## 2019-12-21 DIAGNOSIS — Z79899 Other long term (current) drug therapy: Secondary | ICD-10-CM | POA: Insufficient documentation

## 2019-12-21 DIAGNOSIS — I209 Angina pectoris, unspecified: Secondary | ICD-10-CM | POA: Insufficient documentation

## 2019-12-21 LAB — BASIC METABOLIC PANEL
Anion gap: 13 (ref 5–15)
BUN: 13 mg/dL (ref 6–20)
CO2: 20 mmol/L — ABNORMAL LOW (ref 22–32)
Calcium: 8.7 mg/dL — ABNORMAL LOW (ref 8.9–10.3)
Chloride: 107 mmol/L (ref 98–111)
Creatinine, Ser: 0.9 mg/dL (ref 0.61–1.24)
GFR, Estimated: >60 mL/min (ref >60)
Glucose, Bld: 110 mg/dL — ABNORMAL HIGH (ref 70–99)
Potassium: 4.2 mmol/L (ref 3.5–5.1)
Sodium: 140 mmol/L (ref 135–145)

## 2019-12-21 LAB — CBC
HCT: 46.1 % (ref 39.0–52.0)
Hemoglobin: 15.3 g/dL (ref 13.0–17.0)
MCH: 34.6 pg — ABNORMAL HIGH (ref 26.0–34.0)
MCHC: 33.2 g/dL (ref 30.0–36.0)
MCV: 104.3 fL — ABNORMAL HIGH (ref 80.0–100.0)
Platelets: 193 10*3/uL (ref 150–400)
RBC: 4.42 MIL/uL (ref 4.22–5.81)
RDW: 13 % (ref 11.5–15.5)
WBC: 9.6 10*3/uL (ref 4.0–10.5)
nRBC: 0 % (ref 0.0–0.2)

## 2019-12-21 LAB — TROPONIN I (HIGH SENSITIVITY)
Troponin I (High Sensitivity): 4 ng/L (ref <18)
Troponin I (High Sensitivity): 4 ng/L (ref <18)

## 2019-12-21 MED ORDER — ACETAMINOPHEN 500 MG PO TABS
1000.0000 mg | ORAL_TABLET | Freq: Once | ORAL | Status: AC
  Administered 2019-12-21: 1000 mg via ORAL
  Filled 2019-12-21: qty 2

## 2019-12-21 MED ORDER — NITROGLYCERIN 0.4 MG SL SUBL
0.4000 mg | SUBLINGUAL_TABLET | SUBLINGUAL | Status: DC | PRN
  Administered 2019-12-21: 0.4 mg via SUBLINGUAL
  Filled 2019-12-21: qty 1

## 2019-12-21 MED ORDER — ASPIRIN 81 MG PO CHEW
324.0000 mg | CHEWABLE_TABLET | Freq: Once | ORAL | Status: AC
  Administered 2019-12-21: 324 mg via ORAL
  Filled 2019-12-21: qty 4

## 2019-12-21 NOTE — ED Notes (Signed)
Provider at bedside

## 2019-12-21 NOTE — ED Triage Notes (Addendum)
Pt has been having chest pain x 2 weeks and was seen at a hospital and stress test done.Test was in conclusive. Pt is scheduled to see a cardiologist on the 26th for 1st visit. Pt said pain has been getting more intense and harder to take a deep breath. Pt said more pressure than anything. SOB

## 2019-12-21 NOTE — ED Notes (Signed)
Patient verbalizes understanding of discharge instructions. Opportunity for questioning and answers were provided. Pt discharged from ED. 

## 2019-12-21 NOTE — ED Provider Notes (Signed)
[facility] Provider Note  CSN: [id number] Arrival date & time: 12/21/19 0032  Chief Complaint(s) Chest Pain  HPI Omar Ward is a 49 y.o. male with a history of hypertension, hyperlipidemia, smoker who presents to the emergency department with 3 hours of left-sided chest pressure that is nonradiating and nonexertional.  Patient reports he had similar pain earlier in the month and was [facility] where he had a stress test that was reportedly inconclusive.  At that time he was prescribed nitroglycerin for the pain.  He reports that his symptoms had resolved a few days after the admission and had not returned up until tonight.  He denies any recent fevers or infections.  No coughing or congestion.  No abdominal pain.  No nausea or vomiting.  He did not take any nitroglycerin, stating that he left it in his wife's car and she is out of town.  Patient is scheduled for cardiology follow up this upcoming week.  HPI  Past Medical History Past Medical History:  Diagnosis Date  . Hernia, abdominal    Patient Active Problem List   Diagnosis Date Noted  . Chest pain 12/03/2019  . Elevated liver enzymes 02/13/2019  . Alcoholism (HCC) 02/13/2019  . Bilateral leg edema 01/22/2019  . Depression, recurrent (HCC) 10/09/2018  . Hyperlipidemia 10/09/2018  . Sleep apnea 10/09/2018  . Essential hypertension 02/15/2017  . Tobacco use disorder 02/15/2017   Home Medication(s) Prior to Admission medications   Medication Sig Start Date End Date Taking? Authorizing Provider  aspirin EC 81 MG tablet Take 81 mg by mouth daily. Swallow whole.   Yes [provider]  atorvastatin (LIPITOR) 40 MG tablet Take 40 mg by mouth daily.   Yes [provider]  nitroGLYCERIN (NITROSTAT) 0.6 MG SL tablet Place 0.4 mg under the tongue every 5 (five) minutes as needed for chest pain.   Yes [provider]  sertraline  (ZOLOFT) 100 MG tablet Take 1.5 tablets (150 mg total) by mouth daily. Patient taking differently: Take 50 mg by mouth daily.  07/02/19  Yes Chambliss, Estill BattenMarshall L, MD  valsartan-hydrochlorothiazide (DIOVAN-HCT) 160-12.5 MG tablet Take 1 tablet by mouth daily.   Yes [provider]  fluticasone (FLONASE) 50 MCG/ACT nasal spray Place 2 sprays into both nostrils daily. Patient not taking: Reported on 12/03/2019 07/02/19   Carney Livinghambliss, Marshall L, MD  indapamide (LOZOL) 1.25 MG tablet Take 1 tablet (1.25 mg total) by mouth daily. Patient not taking: Reported on 12/21/2019 04/17/19   Carney Livinghambliss, Marshall L, MD                                                                                                                                    Past Surgical History Past Surgical History:  Procedure Laterality Date  . Knee arthrocopy      Family History Family History  Problem Relation Age of  Onset  . Diabetes Mother   . Hypertension Father   . Diabetes Father     Social History Social History   Tobacco Use  . Smoking status: Current Every Day Smoker    Types: Cigarettes  . Smokeless tobacco: Never Used  . Tobacco comment: 1/2 ppd   Substance Use Topics  . Alcohol use: Yes    Alcohol/week: 3.0 - 6.0 standard drinks    Types: 3 - 6 Cans of beer per week  . Drug use: No   Allergies Codeine  Review of Systems Review of Systems All other systems are reviewed and are negative for acute change except as noted in the HPI  Physical Exam Vital Signs  I have reviewed the triage vital signs BP (!) 139/96   Pulse 73   Temp 98.8 F (37.1 C) (Oral)   Resp (!) 8   SpO2 98%   Physical Exam Vitals reviewed.  Constitutional:      General: He is not in acute distress.    Appearance: He is well-developed. He is not diaphoretic.  HENT:     Head: Normocephalic and atraumatic.     Nose: Nose normal.  Eyes:     General: No scleral icterus.       Right eye: No discharge.        Left eye:  No discharge.     Conjunctiva/sclera: Conjunctivae normal.     Pupils: Pupils are equal, round, and reactive to light.  Cardiovascular:     Rate and Rhythm: Normal rate and regular rhythm.     Heart sounds: No murmur heard.  No friction rub. No gallop.   Pulmonary:     Effort: Pulmonary effort is normal. No respiratory distress.     Breath sounds: Normal breath sounds. No stridor. No rales.  Abdominal:     General: There is no distension.     Palpations: Abdomen is soft.     Tenderness: There is no abdominal tenderness.  Musculoskeletal:        General: No tenderness.     Cervical back: Normal range of motion and neck supple.  Skin:    General: Skin is warm and dry.     Findings: No erythema or rash.  Neurological:     Mental Status: He is alert and oriented to person, place, and time.     ED Results and Treatments Labs (all labs ordered are listed, but only abnormal results are displayed) Labs Reviewed  BASIC METABOLIC PANEL - Abnormal; Notable for the following components:      Result Value   CO2 20 (*)    Glucose, Bld 110 (*)    Calcium 8.7 (*)    All other components within normal limits  CBC - Abnormal; Notable for the following components:   MCV 104.3 (*)    MCH 34.6 (*)    All other components within normal limits  TROPONIN I (HIGH SENSITIVITY)  TROPONIN I (HIGH SENSITIVITY)  EKG  EKG Interpretation  Date/Time:  Saturday December 21 2019 00:38:12 EDT Ventricular Rate:  77 PR Interval:  142 QRS Duration: 94 QT Interval:  390 QTC Calculation: 441 R Axis:   35 Text Interpretation: Normal sinus rhythm Incomplete right bundle branch block Borderline ECG NO STEMI. No old tracing to compare Confirmed by Drema Pry [phone]) on 12/21/2019 4:19:28 AM      Radiology DG Chest 2 View  Result Date: 12/21/2019 CLINICAL DATA:  Chest pain EXAM:  CHEST - 2 VIEW COMPARISON:  None. FINDINGS: The heart size and mediastinal contours are within normal limits. Mildly increased interstitial markings seen at the left lung base. The visualized skeletal structures are unremarkable. IMPRESSION: Mildly increased interstitial markings seen at the left lung base which could be due to infectious or inflammatory process. Electronically Signed   By: Jonna Clark M.D.   On: 12/21/2019 01:27    Pertinent labs & imaging results that were available during my care of the patient were reviewed by me and considered in my medical decision making (see chart for details).  Medications Ordered in ED Medications  nitroGLYCERIN (NITROSTAT) SL tablet 0.4 mg (0.4 mg Sublingual Given 12/21/19 0451)  aspirin chewable tablet 324 mg (324 mg Oral Given 12/21/19 0449)  acetaminophen (TYLENOL) tablet 1,000 mg (1,000 mg Oral Given 12/21/19 0559)                                                                                                                                    Procedures Procedures  (including critical care time)  Medical Decision Making / ED Course I have reviewed the nursing notes for this encounter and the patient's prior records (if available in EHR or on provided paperwork).   Omar Ward was evaluated in Emergency Department on 12/21/2019 for the symptoms described in the history of present illness. He was evaluated in the context of the global COVID-19 pandemic, which necessitated consideration that the patient might be at risk for infection with the SARS-CoV-2 virus that causes COVID-19. Institutional protocols and algorithms that pertain to the evaluation of patients at risk for COVID-19 are in a state of rapid change based on information released by regulatory bodies including the CDC and federal and state organizations. These policies and algorithms were followed during the patient's care in the ED.  Atypical chest pain. EKG without acute ischemic  changes or evidence of pericarditis.  Initial troponin was negative. HEART:5 Second Trop flat and negative. Recent stress earlier this month was inconclusive. Already has f/u with cardiology.  Rest of the blood work was reassuring without significant electrolyte derangements or renal sufficiency.  No evidence of leukocytosis or anemia.  Chest x-ray without evidence suggestive of pneumonia, pneumothorax, pneumomediastinum.  No abnormal contour of the mediastinum to suggest dissection. No evidence of acute injuries.  Low suspicion for PE, dissection, or esophageal performation.  Pain significant improved with NTG. Provided with tylenol for  NTG headache.  Discussed case with cardiology who felt that given the stable trops with reassuring EKG, he can be discharged to follow up in clinic this upcoming week.      Final Clinical Impression(s) / ED Diagnoses Final diagnoses:  Angina pectoris (HCC)    The patient appears reasonably screened and/or stabilized for discharge and I doubt any other medical condition or other [facility] requiring further screening, evaluation, or treatment in the ED at this time prior to discharge. Safe for discharge with strict return precautions.  Disposition: Discharge  Condition: Good  I have discussed the results, Dx and Tx plan with the patient/family who expressed understanding and agree(s) with the plan. Discharge instructions discussed at length. The patient/family was given strict return precautions who verbalized understanding of the instructions. No further questions at time of discharge.    ED Discharge Orders    None       Follow Up: Rollene Rotunda, MD [address] [place] [address] [phone]  Go to  as scheduled     This chart was dictated using voice recognition software.  Despite best efforts to proofread,  errors can occur which can change the documentation meaning.   Nira Conn, MD 12/21/19  [phone]

## 2019-12-23 DIAGNOSIS — E669 Obesity, unspecified: Secondary | ICD-10-CM | POA: Insufficient documentation

## 2019-12-23 MED FILL — SERTRALINE HCL 100 MG TAB: 100 | 90 days supply | Qty: 135 | Fill #1

## 2019-12-23 NOTE — Progress Notes (Signed)
Cardiology Office Note   Date:  12/24/2019   ID:  Omar Ward, DOB 05/24/1970, MRN [id number]  PCP:  Carney Living, MD  Cardiologist:   Rollene Rotunda, MD Referring:  Carney Living, MD  Chief Complaint  Patient presents with  . Chest Pain      History of Present Illness: Omar Ward is a 49 y.o. male who is referred by Carney Living, MD for evaluation of chest pain.  The patient was in the ED for this a couple of days ago.  I reviewed these records for this visit.    He was in the hospital in [place], [place] on 10/3 for chest pain.  I was able to review these results.  He had a POET (Plain Old Exercise Treadmill) with hypertensive response that was read as inconclusive.     It was suggested that he follow up for a possible perfusion study.  I do note that he had markedly abnormal lipids.  Total cholesterol 279, triglycerides 216, HDL 42, LDL 194.  He was started on Diovan at discharge.   In our ED he had negative troponin and no acute EKG changes.   He said the pain started really as episodes of diaphoresis with may be some sharp pain under the left breast.  He would have the sweaty episodes and get nauseated.  He finally went to the emergency room in [place] and his left arm started to hurt.  He had sweating in between that hospitalization and the ER visit but he did not have necessarily any chest pain until he presented to the emergency room.  Discomfort was severe.  It was not like previous reflux.  He did not radiate to his jaw or his arms on the day he went to the emergency room but it was worse with trying to swallow or take a deep breath.  He had associated nausea.  He comes on at rest and his episodes happen sporadically.  He does not have symptoms that he can bring on with exertion although he does get dyspneic having a flight of stairs.  He is not describing PND or orthopnea.  Past Medical History:  Diagnosis Date  . Dyslipidemia   . Hernia, abdominal   .  Hypertension   . PUD (peptic ulcer disease)   . Sleep apnea    CPAP    Past Surgical History:  Procedure Laterality Date  . Knee arthrocopy        Current Outpatient Medications  Medication Sig Dispense Refill  . aspirin EC 81 MG tablet Take 81 mg by mouth daily. Swallow whole.    Marland Kitchen atorvastatin (LIPITOR) 40 MG tablet Take 40 mg by mouth daily.    . fluticasone (FLONASE) 50 MCG/ACT nasal spray Place 2 sprays into both nostrils daily. 16 g 6  . nitroGLYCERIN (NITROSTAT) 0.6 MG SL tablet Place 0.4 mg under the tongue every 5 (five) minutes as needed for chest pain.    Marland Kitchen sertraline (ZOLOFT) 100 MG tablet Take 1.5 tablets (150 mg total) by mouth daily. (Patient taking differently: Take 50 mg by mouth daily. ) 135 tablet 1  . valsartan-hydrochlorothiazide (DIOVAN-HCT) 160-12.5 MG tablet Take 1 tablet by mouth daily.    . metoprolol tartrate (LOPRESSOR) 100 MG tablet Take 1 tablet (100 mg total) by mouth once for 1 dose. Take 1 tablet 2 hours prior to your CT scan of your heart. 1 tablet 0   No current facility-administered medications for this visit.  Allergies:   Patient has no active allergies.    Social History:  The patient  reports that he has been smoking cigarettes. He has never used smokeless tobacco. He reports current alcohol use of about 3.0 - 6.0 standard drinks of alcohol per week. He reports that he does not use drugs.   Family History:  The patient's family history includes CAD (age of onset: 81) in his sister; COPD in his mother; Diabetes in his father and mother; Heart disease in his father; Hypertension in his father.    ROS:  Please see the history of present illness.   Otherwise, review of systems are positive for previous elevated liver enzymes, insomnia secondary to third shift, occasional left leg numbness, chronic soft stools.   All other systems are reviewed and negative.    PHYSICAL EXAM: VS:  BP 130/80   Pulse 65   Temp 97.7 F (36.5 C)   Ht 5\' 9"   (1.753 m)   Wt 212 lb (96.2 kg)   SpO2 93%   BMI 31.31 kg/m  , BMI Body mass index is 31.31 kg/m. GENERAL:  Well appearing HEENT:  Pupils equal round and reactive, fundi not visualized, oral mucosa unremarkable NECK:  No jugular venous distention, waveform within normal limits, carotid upstroke brisk and symmetric, no bruits, no thyromegaly LYMPHATICS:  No cervical, inguinal adenopathy LUNGS:  Clear to auscultation bilaterally BACK:  No CVA tenderness CHEST:  Unremarkable HEART:  PMI not displaced or sustained,S1 and S2 within normal limits, no S3, no S4, no clicks, no rubs, no murmurs ABD:  Flat, positive bowel sounds normal in frequency in pitch, no bruits, no rebound, no guarding, no midline pulsatile mass, no hepatomegaly, no splenomegaly EXT:  2 plus pulses throughout, no edema, no cyanosis no clubbing SKIN:  No rashes no nodules NEURO:  Cranial nerves II through XII grossly intact, motor grossly intact throughout PSYCH:  Cognitively intact, oriented to person place and time    EKG:  EKG is ordered today. The ekg ordered today demonstrates sinus rhythm, rate 65, leftward axis, early transition lead V2, no acute ST-T wave changes.   Recent Labs: 07/02/2019: ALT 46 12/21/2019: BUN 13; Creatinine, Ser 0.90; Hemoglobin 15.3; Platelets 193; Potassium 4.2; Sodium 140    Lipid Panel    Component Value Date/Time   CHOL 255 (H) 10/31/2018 1155   TRIG 332 (H) 10/31/2018 1155   HDL 43 10/31/2018 1155   CHOLHDL 5.9 (H) 10/31/2018 1155   LDLCALC 150 (H) 10/31/2018 1155      Wt Readings from Last 3 Encounters:  12/24/19 212 lb (96.2 kg)  12/03/19 210 lb (95.3 kg)  07/02/19 232 lb (105.2 kg)      Other studies Reviewed: Additional studies/ records that were reviewed today include: ED records and Care Everywhere. Review of the above records demonstrates:  Please see elsewhere in the note.     ASSESSMENT AND PLAN:  CHEST PAIN:   Chest pain is somewhat atypical but he had an  inconclusive stress test.  He needs further imaging as he has significant cardiovascular risk factors.  I would like to evaluate him further with a coronary CTA.  The pretest probability of obstructive coronary disease is at least moderately high.  If this is negative I have suggested to him GI evaluation.  Of note he did previously have elevated liver enzymes that came down after he reduced alcohol intake.  HTN: He was just started on Diovan HCTZ.  We are going to give him  a new blood pressure cuff as I think his might be inaccurate.  His readings are somewhat labile.  I will stop the indapamide which he was on a long time ago.  DYSLIPIDEMIA: He was just started on Lipitor.  I will put him down for an 8-week lipid and liver profile.  TOBACCO ABUSE: He is down to 5 cigarettes from about a pack and a half and I encouraged complete abstinence.   Current medicines are reviewed at length with the patient today.  The patient does not have concerns regarding medicines.  The following changes have been made: As above  Labs/ tes ts ordered today include:   Orders Placed This Encounter  Procedures  . CT CORONARY MORPH W/CTA COR W/SCORE W/CA W/CM &/OR WO/CM  . CT CORONARY FRACTIONAL FLOW RESERVE DATA PREP  . CT CORONARY FRACTIONAL FLOW RESERVE FLUID ANALYSIS  . Hepatic function panel  . Lipid panel  . EKG 12-Lead     Disposition:   FU with as needed and based on the results of the above.   Signed, Rollene Rotunda, MD  12/24/2019 2:39 PM    [facility]

## 2019-12-24 ENCOUNTER — Encounter: Payer: Self-pay | Admitting: Cardiology

## 2019-12-24 ENCOUNTER — Ambulatory Visit: Payer: 59 | Admitting: Cardiology

## 2019-12-24 ENCOUNTER — Other Ambulatory Visit (HOSPITAL_COMMUNITY): Payer: Self-pay | Admitting: Cardiology

## 2019-12-24 ENCOUNTER — Other Ambulatory Visit: Payer: Self-pay

## 2019-12-24 VITALS — BP 130/80 | HR 65 | Temp 97.7°F | Ht 69.0 in | Wt 212.0 lb

## 2019-12-24 DIAGNOSIS — E785 Hyperlipidemia, unspecified: Secondary | ICD-10-CM

## 2019-12-24 DIAGNOSIS — E669 Obesity, unspecified: Secondary | ICD-10-CM | POA: Diagnosis not present

## 2019-12-24 DIAGNOSIS — R072 Precordial pain: Secondary | ICD-10-CM

## 2019-12-24 DIAGNOSIS — I10 Essential (primary) hypertension: Secondary | ICD-10-CM

## 2019-12-24 MED ORDER — METOPROLOL TARTRATE 100 MG PO TABS: 100.0000 mg | ORAL_TABLET | Freq: Once | ORAL | 0 refills | Status: DC

## 2019-12-24 MED FILL — METOPROLOL TARTRATE 100 MG: 100 | 1 days supply | Qty: 1 | Fill #0

## 2019-12-24 NOTE — Patient Instructions (Signed)
Medication Instructions:  STOP taking your indapamide/Lozol *If you need a refill on your cardiac medications before your next appointment, please call your pharmacy*   Lab Work: Liver function and lipid panel in 8 weeks If you have labs (blood work) drawn today and your tests are completely normal, you will receive your results only by:  MyChart Message (if you have MyChart) OR  A paper copy in the mail If you have any lab test that is abnormal or we need to change your treatment, we will call you to review the results.   Testing/Procedures: See below   Follow-Up: At [facility], you and your health needs are our priority.  As part of our continuing mission to provide you with exceptional heart care, we have created designated Provider Care Teams.  These Care Teams include your primary Cardiologist (physician) and Advanced Practice Providers (APPs -  Physician Assistants and Nurse Practitioners) who all work together to provide you with the care you need, when you need it.  We recommend signing up for the patient portal called "MyChart".  Sign up information is provided on this After Visit Summary.  MyChart is used to connect with patients for Virtual Visits (Telemedicine).  Patients are able to view lab/test results, encounter notes, upcoming appointments, etc.  Non-urgent messages can be sent to your provider as well.   To learn more about what you can do with MyChart, go to [email or web address].    Your next appointment:   Follow up on an as-needed basis based on results of your Cardiac CT  Provider:   Rollene Rotunda, MD   Other Instructions Your cardiac CT will be scheduled at one of the below locations:   [facility] [address] [place], [place] [address] [phone]  OR  [facility] [address] [place], [place] [address] [phone]  If scheduled at [facility], please arrive at the  [facility] main entrance of [facility] 30 minutes prior to test start time. Proceed to the [facility] Radiology Department (first floor) to check-in and test prep.  If scheduled at [facility], please arrive 15 mins early for check-in and test prep.  Please follow these instructions carefully (unless otherwise directed):  Hold all erectile dysfunction medications at least 3 days (72 hrs) prior to test.  On the Night Before the Test:  Be sure to Drink plenty of water.  Do not consume any caffeinated/decaffeinated beverages or chocolate 12 hours prior to your test.  Do not take any antihistamines 12 hours prior to your test.  If the patient has contrast allergy: ? Patient will need a prescription for Prednisone and very clear instructions (as follows): 1. Prednisone 50 mg - take 13 hours prior to test 2. Take another Prednisone 50 mg 7 hours prior to test 3. Take another Prednisone 50 mg 1 hour prior to test 4. Take Benadryl 50 mg 1 hour prior to test  Patient must complete all four doses of above prophylactic medications.  Patient will need a ride after test due to Benadryl.  On the Day of the Test:  Drink plenty of water. Do not drink any water within one hour of the test.  Do not eat any food 4 hours prior to the test.  You may take your regular medications prior to the test.   Take metoprolol (Lopressor) two hours prior to test.  HOLD Furosemide/Hydrochlorothiazide morning of the test.  FEMALES- please wear  underwire-free bra if available        After the Test:  Drink plenty of water.  After receiving IV contrast, you may experience a mild flushed feeling. This is normal.  On occasion, you may experience a mild rash up to 24 hours after the test. This is not dangerous. If this occurs, you can take Benadryl 25 mg and increase your fluid intake.  If you experience trouble breathing, this can be serious. If it is severe call 911  IMMEDIATELY. If it is mild, please call our office.  If you take any of these medications: Glipizide/Metformin, Avandament, Glucavance, please do not take 48 hours after completing test unless otherwise instructed.   Once we have confirmed authorization from your insurance company, we will call you to set up a date and time for your test. Based on how quickly your insurance processes prior authorizations requests, please allow up to 4 weeks to be contacted for scheduling your Cardiac CT appointment. Be advised that routine Cardiac CT appointments could be scheduled as many as 8 weeks after your provider has ordered it.  For non-scheduling related questions, please contact the cardiac imaging nurse navigator should you have any questions/concerns: Marchia Bond, Cardiac Imaging Nurse Navigator Burley Saver, Interim Cardiac Imaging Nurse [facility] Direct Office Dial: [phone]   For scheduling needs, including cancellations and rescheduling, please call Vivien Rota at [phone], option 3.

## 2019-12-31 MED FILL — ATORVASTATIN 40 MG TABLET: 40 | 30 days supply | Qty: 30 | Fill #1

## 2019-12-31 MED FILL — VALSARTAN-HCTZ 160-12.5 MG: 160-12.5 | 30 days supply | Qty: 30 | Fill #1

## 2019-12-31 MED FILL — ASPIRIN LOW DOSE 81 MG TBEC: 81 | 30 days supply | Qty: 30 | Fill #1

## 2020-01-06 MED FILL — FLUTICASONE PROP 50 MCG SPR: 50 | 30 days supply | Qty: 16 | Fill #1

## 2020-01-07 ENCOUNTER — Telehealth (HOSPITAL_COMMUNITY): Payer: Self-pay | Admitting: Emergency Medicine

## 2020-01-07 NOTE — Telephone Encounter (Signed)
Reaching out to patient to offer assistance regarding upcoming cardiac imaging study; pt verbalizes understanding of appt date/time, parking situation and where to check in, pre-test NPO status and medications ordered, and verified current allergies; name and call back number provided for further questions should they arise Verlon Pischke RN Navigator Cardiac Imaging [facility] [phone] office [phone] cell   Pt to take metoprolol 2 hr prior to scan 

## 2020-01-08 ENCOUNTER — Ambulatory Visit: Payer: 59 | Admitting: Internal Medicine

## 2020-01-08 ENCOUNTER — Encounter: Payer: Self-pay | Admitting: Internal Medicine

## 2020-01-08 ENCOUNTER — Other Ambulatory Visit (HOSPITAL_COMMUNITY): Payer: Self-pay | Admitting: Internal Medicine

## 2020-01-08 ENCOUNTER — Other Ambulatory Visit: Payer: Self-pay

## 2020-01-08 DIAGNOSIS — R748 Abnormal levels of other serum enzymes: Secondary | ICD-10-CM

## 2020-01-08 DIAGNOSIS — R634 Abnormal weight loss: Secondary | ICD-10-CM | POA: Diagnosis not present

## 2020-01-08 DIAGNOSIS — G4733 Obstructive sleep apnea (adult) (pediatric): Secondary | ICD-10-CM | POA: Diagnosis not present

## 2020-01-08 DIAGNOSIS — G4700 Insomnia, unspecified: Secondary | ICD-10-CM

## 2020-01-08 DIAGNOSIS — Z9989 Dependence on other enabling machines and devices: Secondary | ICD-10-CM | POA: Diagnosis not present

## 2020-01-08 DIAGNOSIS — K219 Gastro-esophageal reflux disease without esophagitis: Secondary | ICD-10-CM | POA: Diagnosis not present

## 2020-01-08 DIAGNOSIS — F17200 Nicotine dependence, unspecified, uncomplicated: Secondary | ICD-10-CM

## 2020-01-08 DIAGNOSIS — F1020 Alcohol dependence, uncomplicated: Secondary | ICD-10-CM | POA: Diagnosis not present

## 2020-01-08 DIAGNOSIS — E785 Hyperlipidemia, unspecified: Secondary | ICD-10-CM

## 2020-01-08 LAB — BASIC METABOLIC PANEL
BUN: 14 mg/dL (ref 6–23)
CO2: 27 mEq/L (ref 19–32)
Calcium: 9.7 mg/dL (ref 8.4–10.5)
Chloride: 103 mEq/L (ref 96–112)
Creatinine, Ser: 1.06 mg/dL (ref 0.40–1.50)
GFR: 81.44 mL/min (ref >60.00)
Glucose, Bld: 97 mg/dL (ref 70–99)
Potassium: 4.1 mEq/L (ref 3.5–5.1)
Sodium: 138 mEq/L (ref 135–145)

## 2020-01-08 LAB — URINALYSIS
Bilirubin Urine: NEGATIVE
Hgb urine dipstick: NEGATIVE
Ketones, ur: NEGATIVE
Leukocytes,Ua: NEGATIVE
Nitrite: NEGATIVE
Specific Gravity, Urine: 1.01 (ref 1.000–1.030)
Total Protein, Urine: NEGATIVE
Urine Glucose: NEGATIVE
Urobilinogen, UA: 0.2 (ref 0.0–1.0)
pH: 7 (ref 5.0–8.0)

## 2020-01-08 LAB — CBC WITH DIFFERENTIAL/PLATELET
Basophils Absolute: 0 10*3/uL (ref 0.0–0.1)
Basophils Relative: 0.6 % (ref 0.0–3.0)
Eosinophils Absolute: 0.1 10*3/uL (ref 0.0–0.7)
Eosinophils Relative: 0.7 % (ref 0.0–5.0)
HCT: 41.1 % (ref 39.0–52.0)
Hemoglobin: 14.1 g/dL (ref 13.0–17.0)
Lymphocytes Relative: 26.7 % (ref 12.0–46.0)
Lymphs Abs: 2.3 10*3/uL (ref 0.7–4.0)
MCHC: 34.2 g/dL (ref 30.0–36.0)
MCV: 102.4 fl — ABNORMAL HIGH (ref 78.0–100.0)
Monocytes Absolute: 0.7 10*3/uL (ref 0.1–1.0)
Monocytes Relative: 8.9 % (ref 3.0–12.0)
Neutro Abs: 5.3 10*3/uL (ref 1.4–7.7)
Neutrophils Relative %: 63.1 % (ref 43.0–77.0)
Platelets: 180 10*3/uL (ref 150.0–400.0)
RBC: 4.02 Mil/uL — ABNORMAL LOW (ref 4.22–5.81)
RDW: 13.1 % (ref 11.5–15.5)
WBC: 8.4 10*3/uL (ref 4.0–10.5)

## 2020-01-08 LAB — VITAMIN B12: Vitamin B-12: 437 pg/mL (ref 211–911)

## 2020-01-08 LAB — TSH: TSH: 1.94 u[IU]/mL (ref 0.35–4.50)

## 2020-01-08 LAB — H. PYLORI ANTIBODY, IGG: H Pylori IgG: NEGATIVE

## 2020-01-08 LAB — VITAMIN D 25 HYDROXY (VIT D DEFICIENCY, FRACTURES): VITD: 24.72 ng/mL — ABNORMAL LOW (ref 30.00–100.00)

## 2020-01-08 MED ORDER — VALSARTAN-HYDROCHLOROTHIAZIDE 160-12.5 MG PO TABS
1.0000 | ORAL_TABLET | Freq: Every day | ORAL | 3 refills | Status: DC
Start: 2020-01-08 — End: 2020-04-01

## 2020-01-08 MED ORDER — TRAZODONE HCL 50 MG PO TABS
25.0000 mg | ORAL_TABLET | Freq: Every evening | ORAL | 1 refills | Status: DC | PRN
Start: 2020-01-08 — End: 2020-04-01

## 2020-01-08 MED FILL — traZODone HCL 50 MG TABS: 50 | 90 days supply | Qty: 90 | Fill #0

## 2020-01-08 NOTE — Progress Notes (Signed)
Subjective:  Patient ID: Omar Ward, male    DOB: 1970/03/16  Age: 49 y.o. MRN: [id number]  CC: New Patient (Initial Visit)   HPI ARNEY MAYABB presents for a new pt visit  Per hx: "The patient was in the ED for this a couple of days ago.  I reviewed these records for this visit.    He was in the hospital in [place], [place] on 10/3 for chest pain.  I was able to review these results.  He had a POET (Plain Old Exercise Treadmill) with hypertensive response that was read as inconclusive.     It was suggested that he follow up for a possible perfusion study.  I do note that he had markedly abnormal lipids.  Total cholesterol 279, triglycerides 216, HDL 42, LDL 194.  He was started on Diovan at discharge.   In our ED he had negative troponin and no acute EKG changes.   He said the pain started really as episodes of diaphoresis with may be some sharp pain under the left breast.  He would have the sweaty episodes and get nauseated.  He finally went to the emergency room in [place] and his left arm started to hurt.  He had sweating in between that hospitalization and the ER visit but he did not have necessarily any chest pain until he presented to the emergency room.  Discomfort was severe.  It was not like previous reflux.  He did not radiate to his jaw or his arms on the day he went to the emergency room but it was worse with trying to swallow or take a deep breath.  He had associated nausea.  He comes on at rest and his episodes happen sporadically.  He does not have symptoms that he can bring on with exertion although he does get dyspneic having a flight of stairs.  He is not describing PND or orthopnea."    Allergies:   Patient has no active allergies.    Social History:  The patient  reports that he has been smoking cigarettes. He has never used smokeless tobacco. He reports current alcohol use of about 3.0 - 6.0 standard drinks of alcohol per week. He reports that he does not use drugs.   Family  History:  The patient's family history includes CAD (age of onset: 22) in his sister; COPD in his mother; Diabetes in his father and mother; Heart disease in his father; Hypertension in his father.   Outpatient Medications Prior to Visit  Medication Sig Dispense Refill  . aspirin EC 81 MG tablet Take 81 mg by mouth daily. Swallow whole.    Marland Kitchen atorvastatin (LIPITOR) 40 MG tablet Take 40 mg by mouth daily.    . fluticasone (FLONASE) 50 MCG/ACT nasal spray Place 2 sprays into both nostrils daily. 16 g 6  . nitroGLYCERIN (NITROSTAT) 0.6 MG SL tablet Place 0.4 mg under the tongue every 5 (five) minutes as needed for chest pain.    Marland Kitchen sertraline (ZOLOFT) 100 MG tablet Take 1.5 tablets (150 mg total) by mouth daily. (Patient taking differently: Take 50 mg by mouth daily. ) 135 tablet 1  . valsartan-hydrochlorothiazide (DIOVAN-HCT) 160-12.5 MG tablet Take 1 tablet by mouth daily.    . metoprolol tartrate (LOPRESSOR) 100 MG tablet Take 1 tablet (100 mg total) by mouth once for 1 dose. Take 1 tablet 2 hours prior to your CT scan of your heart. 1 tablet 0  . MODERNA COVID-19 VACCINE 100 MCG/0.5ML injection     .  PFIZER-BIONTECH COVID-19 VACC 30 MCG/0.3ML injection      No facility-administered medications prior to visit.    ROS: Review of Systems  Constitutional: Negative for appetite change, fatigue and unexpected weight change.  HENT: Negative for congestion, nosebleeds, sneezing, sore throat and trouble swallowing.   Eyes: Negative for itching and visual disturbance.  Respiratory: Positive for chest tightness. Negative for cough.   Cardiovascular: Positive for chest pain. Negative for palpitations and leg swelling.  Gastrointestinal: Negative for abdominal distention, blood in stool, diarrhea and nausea.  Genitourinary: Negative for frequency and hematuria.  Musculoskeletal: Negative for back pain, gait problem, joint swelling and neck pain.  Skin: Negative for rash.  Neurological: Negative for  dizziness, tremors, speech difficulty and weakness.  Psychiatric/Behavioral: Negative for agitation, dysphoric mood, sleep disturbance and suicidal ideas. The patient is nervous/anxious.     Objective:  BP 138/82 (BP Location: Left Arm)   Pulse 68   Temp 99.4 F (37.4 C) (Oral)   Wt 208 lb (94.3 kg)   SpO2 95%   BMI 30.72 kg/m   BP Readings from Last 3 Encounters:  01/08/20 138/82  12/24/19 130/80  12/21/19 (!) 153/97    Wt Readings from Last 3 Encounters:  01/08/20 208 lb (94.3 kg)  12/24/19 212 lb (96.2 kg)  12/03/19 210 lb (95.3 kg)    Physical Exam Constitutional:      General: He is not in acute distress.    Appearance: He is well-developed.     Comments: NAD  Eyes:     Conjunctiva/sclera: Conjunctivae normal.     Pupils: Pupils are equal, round, and reactive to light.  Neck:     Thyroid: No thyromegaly.     Vascular: No JVD.  Cardiovascular:     Rate and Rhythm: Normal rate and regular rhythm.     Heart sounds: Normal heart sounds. No murmur heard.  No friction rub. No gallop.   Pulmonary:     Effort: Pulmonary effort is normal. No respiratory distress.     Breath sounds: Normal breath sounds. No wheezing or rales.  Chest:     Chest wall: No tenderness.  Abdominal:     General: Bowel sounds are normal. There is no distension.     Palpations: Abdomen is soft. There is no mass.     Tenderness: There is no abdominal tenderness. There is no guarding or rebound.  Musculoskeletal:        General: No tenderness. Normal range of motion.     Cervical back: Normal range of motion.  Lymphadenopathy:     Cervical: No cervical adenopathy.  Skin:    General: Skin is warm and dry.     Findings: No rash.  Neurological:     Mental Status: He is alert and oriented to person, place, and time.     Cranial Nerves: No cranial nerve deficit.     Motor: No abnormal muscle tone.     Coordination: Coordination normal.     Gait: Gait normal.     Deep Tendon Reflexes:  Reflexes are normal and symmetric.  Psychiatric:        Behavior: Behavior normal.        Thought Content: Thought content normal.        Judgment: Judgment normal.      A total time of >60 minutes was spent preparing to see the patient, reviewing tests, x-rays, operative reports and outside records.  Also, obtaining history and performing comprehensive physical exam.  Additionally, counseling the  patient regarding the above listed issues.   Finally, documenting clinical information in the health records, coordination of care, educating the patient. It is a complex case.  Lab Results  Component Value Date   WBC 9.6 12/21/2019   HGB 15.3 12/21/2019   HCT 46.1 12/21/2019   PLT 193 12/21/2019   GLUCOSE 110 (H) 12/21/2019   CHOL 255 (H) 10/31/2018   TRIG 332 (H) 10/31/2018   HDL 43 10/31/2018   LDLCALC 150 (H) 10/31/2018   ALT 46 (H) 07/02/2019   AST 30 07/02/2019   NA 140 12/21/2019   K 4.2 12/21/2019   CL 107 12/21/2019   CREATININE 0.90 12/21/2019   BUN 13 12/21/2019   CO2 20 (L) 12/21/2019   TSH 2.380 10/31/2018   INR 0.97 11/08/2014    DG Chest 2 View  Result Date: 12/21/2019 CLINICAL DATA:  Chest pain EXAM: CHEST - 2 VIEW COMPARISON:  None. FINDINGS: The heart size and mediastinal contours are within normal limits. Mildly increased interstitial markings seen at the left lung base. The visualized skeletal structures are unremarkable. IMPRESSION: Mildly increased interstitial markings seen at the left lung base which could be due to infectious or inflammatory process. Electronically Signed   By: Jonna Clark M.D.   On: 12/21/2019 01:27    Assessment & Plan:    Sonda Primes, MD

## 2020-01-08 NOTE — Assessment & Plan Note (Signed)
GI ref Needs EGD, colonoscopy

## 2020-01-08 NOTE — Assessment & Plan Note (Signed)
CPAP.  

## 2020-01-08 NOTE — Assessment & Plan Note (Signed)
Quit in 10/2019

## 2020-01-08 NOTE — Assessment & Plan Note (Signed)
1/2 ppd 

## 2020-01-08 NOTE — Assessment & Plan Note (Signed)
Lipitor 

## 2020-01-08 NOTE — Assessment & Plan Note (Signed)
Labs Quit ETOH in 10/2019

## 2020-01-08 NOTE — Assessment & Plan Note (Addendum)
Chronic - better off alcohol GI ref

## 2020-01-08 NOTE — Assessment & Plan Note (Signed)
Trazodone prn 

## 2020-01-09 ENCOUNTER — Other Ambulatory Visit (HOSPITAL_COMMUNITY): Payer: Self-pay | Admitting: Internal Medicine

## 2020-01-09 ENCOUNTER — Ambulatory Visit (HOSPITAL_COMMUNITY)
Admission: RE | Admit: 2020-01-09 | Discharge: 2020-01-09 | Disposition: A | Discharge status: Home / Self Care | Payer: 59 | Source: Ambulatory Visit | Attending: Cardiology | Admitting: Cardiology

## 2020-01-09 ENCOUNTER — Other Ambulatory Visit: Payer: Self-pay | Admitting: Internal Medicine

## 2020-01-09 DIAGNOSIS — R072 Precordial pain: Secondary | ICD-10-CM | POA: Diagnosis not present

## 2020-01-09 DIAGNOSIS — E559 Vitamin D deficiency, unspecified: Secondary | ICD-10-CM

## 2020-01-09 MED ORDER — IOHEXOL 350 MG/ML SOLN
80.0000 mL | Freq: Once | INTRAVENOUS | Status: AC | PRN
  Administered 2020-01-09: 80 mL via INTRAVENOUS

## 2020-01-09 MED ORDER — NITROGLYCERIN 0.4 MG SL SUBL
SUBLINGUAL_TABLET | SUBLINGUAL | Status: AC
  Administered 2020-01-09: 0.8 mg via SUBLINGUAL
  Filled 2020-01-09: qty 2

## 2020-01-09 MED ORDER — NITROGLYCERIN 0.4 MG SL SUBL: 0.8000 mg | SUBLINGUAL_TABLET | Freq: Once | SUBLINGUAL | Status: AC

## 2020-01-09 MED ORDER — VITAMIN D3 1.25 MG (50000 UT) PO CAPS: 1.0000 | ORAL_CAPSULE | ORAL | 0 refills | Status: DC

## 2020-01-09 MED FILL — VIT D3-50 50,000 UNITS CAPS: 1.25 MG | 42 days supply | Qty: 6 | Fill #0

## 2020-01-10 DIAGNOSIS — R072 Precordial pain: Secondary | ICD-10-CM | POA: Diagnosis not present

## 2020-01-12 ENCOUNTER — Encounter: Payer: Self-pay | Admitting: Internal Medicine

## 2020-02-10 MED FILL — VALSARTAN-HCTZ 160-12.5 MG: 160-12.5 | 90 days supply | Qty: 90 | Fill #0

## 2020-02-12 ENCOUNTER — Other Ambulatory Visit: Payer: Self-pay | Admitting: Internal Medicine

## 2020-02-12 ENCOUNTER — Other Ambulatory Visit (HOSPITAL_COMMUNITY): Payer: Self-pay | Admitting: Internal Medicine

## 2020-02-12 MED FILL — ATORVASTATIN 40 MG TABLET: 40 | 90 days supply | Qty: 90 | Fill #0

## 2020-03-16 ENCOUNTER — Ambulatory Visit: Payer: 59 | Admitting: Internal Medicine

## 2020-03-18 ENCOUNTER — Ambulatory Visit: Payer: 59 | Admitting: Internal Medicine

## 2020-03-31 ENCOUNTER — Other Ambulatory Visit: Payer: Self-pay

## 2020-04-01 ENCOUNTER — Ambulatory Visit: Payer: 59 | Admitting: Internal Medicine

## 2020-04-01 ENCOUNTER — Other Ambulatory Visit (HOSPITAL_COMMUNITY): Payer: Self-pay | Admitting: Internal Medicine

## 2020-04-01 ENCOUNTER — Encounter: Payer: Self-pay | Admitting: Internal Medicine

## 2020-04-01 DIAGNOSIS — R634 Abnormal weight loss: Secondary | ICD-10-CM

## 2020-04-01 DIAGNOSIS — E559 Vitamin D deficiency, unspecified: Secondary | ICD-10-CM

## 2020-04-01 DIAGNOSIS — R748 Abnormal levels of other serum enzymes: Secondary | ICD-10-CM

## 2020-04-01 DIAGNOSIS — F339 Major depressive disorder, recurrent, unspecified: Secondary | ICD-10-CM

## 2020-04-01 DIAGNOSIS — K219 Gastro-esophageal reflux disease without esophagitis: Secondary | ICD-10-CM | POA: Diagnosis not present

## 2020-04-01 DIAGNOSIS — I2510 Atherosclerotic heart disease of native coronary artery without angina pectoris: Secondary | ICD-10-CM

## 2020-04-01 DIAGNOSIS — R072 Precordial pain: Secondary | ICD-10-CM

## 2020-04-01 DIAGNOSIS — I2583 Coronary atherosclerosis due to lipid rich plaque: Secondary | ICD-10-CM

## 2020-04-01 MED ORDER — ESOMEPRAZOLE MAGNESIUM 20 MG PO CPDR: 20.0000 mg | DELAYED_RELEASE_CAPSULE | Freq: Every day | ORAL | 3 refills | Status: DC

## 2020-04-01 MED ORDER — ATORVASTATIN CALCIUM 40 MG PO TABS
40.0000 mg | ORAL_TABLET | Freq: Every evening | ORAL | 3 refills | Status: DC
Start: 2020-04-01 — End: 2021-09-21

## 2020-04-01 MED ORDER — TRAZODONE HCL 50 MG PO TABS
25.0000 mg | ORAL_TABLET | Freq: Every evening | ORAL | 3 refills | Status: DC | PRN
Start: 2020-04-01 — End: 2021-09-21

## 2020-04-01 MED ORDER — SERTRALINE HCL 100 MG PO TABS: 150.0000 mg | ORAL_TABLET | Freq: Every day | ORAL | 3 refills | Status: DC

## 2020-04-01 MED ORDER — VALSARTAN-HYDROCHLOROTHIAZIDE 160-12.5 MG PO TABS
1.0000 | ORAL_TABLET | Freq: Every day | ORAL | 3 refills | Status: DC
Start: 2020-04-01 — End: 2021-09-21

## 2020-04-01 MED ORDER — VITAMIN D3 50 MCG (2000 UT) PO CAPS: 2000.0000 [IU] | ORAL_CAPSULE | Freq: Every day | ORAL | 3 refills | Status: DC

## 2020-04-01 MED FILL — SERTRALINE HCL 100 MG TAB: 100 | 90 days supply | Qty: 135 | Fill #0

## 2020-04-01 MED FILL — traZODone HCL 50 MG TABS: 50 | 90 days supply | Qty: 90 | Fill #0

## 2020-04-01 MED FILL — ESOMEPRAZOLE MAG DR 20 MG C: 20 | 90 days supply | Qty: 90 | Fill #0

## 2020-04-01 NOTE — Assessment & Plan Note (Signed)
Not drinking now

## 2020-04-01 NOTE — Assessment & Plan Note (Signed)
On Lipitor 

## 2020-04-01 NOTE — Progress Notes (Signed)
Subjective:  Patient ID: Omar Ward, male    DOB: 1970-12-07  Age: 50 y.o. MRN: [id number]  CC: Follow-up (2 month f/u)   HPI ASAAD GULLEY presents for HTN, dyslipidemia, depression, GERD f/u  Outpatient Medications Prior to Visit  Medication Sig Dispense Refill  . aspirin EC 81 MG tablet Take 81 mg by mouth daily. Swallow whole.    . Cholecalciferol (VITAMIN D3) 1.25 MG (50000 UT) CAPS Take 1 capsule by mouth once a week. 6 capsule 0  . fluticasone (FLONASE) 50 MCG/ACT nasal spray Place 2 sprays into both nostrils daily. 16 g 6  . nitroGLYCERIN (NITROSTAT) 0.6 MG SL tablet Place 0.4 mg under the tongue every 5 (five) minutes as needed for chest pain.    Marland Kitchen atorvastatin (LIPITOR) 40 MG tablet TAKE 1 TABLET BY MOUTH EVERY EVENING 90 tablet 3  . sertraline (ZOLOFT) 100 MG tablet Take 1.5 tablets (150 mg total) by mouth daily. (Patient taking differently: Take 50 mg by mouth daily.) 135 tablet 1  . traZODone (DESYREL) 50 MG tablet Take 0.5-1 tablets (25-50 mg total) by mouth at bedtime as needed for sleep. 90 tablet 1  . valsartan-hydrochlorothiazide (DIOVAN-HCT) 160-12.5 MG tablet Take 1 tablet by mouth daily. 90 tablet 3  . metoprolol tartrate (LOPRESSOR) 100 MG tablet Take 1 tablet (100 mg total) by mouth once for 1 dose. Take 1 tablet 2 hours prior to your CT scan of your heart. 1 tablet 0   No facility-administered medications prior to visit.    ROS: Review of Systems  Constitutional: Negative for appetite change, fatigue and unexpected weight change.  HENT: Negative for congestion, nosebleeds, sneezing, sore throat and trouble swallowing.   Eyes: Negative for itching and visual disturbance.  Respiratory: Negative for cough.   Cardiovascular: Negative for chest pain, palpitations and leg swelling.  Gastrointestinal: Negative for abdominal distention, blood in stool, diarrhea and nausea.  Genitourinary: Negative for frequency and hematuria.  Musculoskeletal: Negative for back  pain, gait problem, joint swelling and neck pain.  Skin: Negative for rash.  Neurological: Negative for dizziness, tremors, speech difficulty and weakness.  Psychiatric/Behavioral: Negative for agitation, dysphoric mood and sleep disturbance. The patient is not nervous/anxious.     Objective:  BP 124/82 (BP Location: Left Arm)   Pulse 81   Temp 99.3 F (37.4 C) (Oral)   Ht 5\' 9"  (1.753 m)   Wt 215 lb (97.5 kg)   SpO2 95%   BMI 31.75 kg/m   BP Readings from Last 3 Encounters:  04/01/20 124/82  01/09/20 116/69  01/08/20 138/82    Wt Readings from Last 3 Encounters:  04/01/20 215 lb (97.5 kg)  01/08/20 208 lb (94.3 kg)  12/24/19 212 lb (96.2 kg)    Physical Exam Constitutional:      General: He is not in acute distress.    Appearance: He is well-developed. He is obese.     Comments: NAD  HENT:     Mouth/Throat:     Mouth: Oropharynx is clear and moist.  Eyes:     Conjunctiva/sclera: Conjunctivae normal.     Pupils: Pupils are equal, round, and reactive to light.  Neck:     Thyroid: No thyromegaly.     Vascular: No JVD.  Cardiovascular:     Rate and Rhythm: Normal rate and regular rhythm.     Pulses: Intact distal pulses.     Heart sounds: Normal heart sounds. No murmur heard. No friction rub. No gallop.   Pulmonary:  Effort: Pulmonary effort is normal. No respiratory distress.     Breath sounds: Normal breath sounds. No wheezing or rales.  Chest:     Chest wall: No tenderness.  Abdominal:     General: Bowel sounds are normal. There is no distension.     Palpations: Abdomen is soft. There is no mass.     Tenderness: There is no abdominal tenderness. There is no guarding or rebound.  Musculoskeletal:        General: No tenderness or edema. Normal range of motion.     Cervical back: Normal range of motion.  Lymphadenopathy:     Cervical: No cervical adenopathy.  Skin:    General: Skin is warm and dry.     Findings: No rash.  Neurological:     Mental  Status: He is alert and oriented to person, place, and time.     Cranial Nerves: No cranial nerve deficit.     Motor: No abnormal muscle tone.     Coordination: He displays a negative Romberg sign. Coordination normal.     Gait: Gait normal.     Deep Tendon Reflexes: Reflexes are normal and symmetric.  Psychiatric:        Mood and Affect: Mood and affect normal.        Behavior: Behavior normal.        Thought Content: Thought content normal.        Judgment: Judgment normal.     Lab Results  Component Value Date   WBC 8.4 01/08/2020   HGB 14.1 01/08/2020   HCT 41.1 01/08/2020   PLT 180.0 01/08/2020   GLUCOSE 97 01/08/2020   CHOL 255 (H) 10/31/2018   TRIG 332 (H) 10/31/2018   HDL 43 10/31/2018   LDLCALC 150 (H) 10/31/2018   ALT 46 (H) 07/02/2019   AST 30 07/02/2019   NA 138 01/08/2020   K 4.1 01/08/2020   CL 103 01/08/2020   CREATININE 1.06 01/08/2020   BUN 14 01/08/2020   CO2 27 01/08/2020   TSH 1.94 01/08/2020   INR 0.97 11/08/2014    CT CORONARY MORPH W/CTA COR W/SCORE W/CA W/CM &/OR WO/CM  Addendum Date: 01/09/2020   ADDENDUM REPORT: 01/09/2020 17:01 CLINICAL DATA:  Chest pain EXAM: Cardiac CTA MEDICATIONS: Sub lingual nitro. 4mg  x 2 TECHNIQUE: The patient was scanned on a Siemens 192 slice scanner. Gantry rotation speed was 250 msecs. Collimation was 0.6 mm. A 100 kV prospective scan was triggered in the ascending thoracic aorta at 35-75% of the R-R interval. Average HR during the scan was 60 bpm. The 3D data set was interpreted on a dedicated work station using MPR, MIP and VRT modes. A total of 80cc of contrast was used. FINDINGS: Non-cardiac: See separate report from [facility]. Pulmonary veins drained normally to the left atrium. Calcium Score: 84 Agatston units. Coronary Arteries: Right dominant with no anomalies LM: No plaque or stenosis. LAD system: Mixed plaque proximal LAD, up to 50% stenosis. Circumflex system: Moderate ramus, no plaque or stenosis. No  plaque or stenosis in AV LCx. RCA system: No plaque or stenosis. IMPRESSION: 1. Coronary artery calcium score 84 Agatston units. This places the patient in the 85th percentile for age and gender. 2. Approximately 50% proximal stenosis in the LAD wtih mixed plaque. Will send FFR to confirm not hemodynamically significant. Dalton Mclean Electronically Signed   By: [facility] M.D.   On: 01/09/2020 17:01   Result Date: 01/09/2020 EXAM: OVER-READ INTERPRETATION  CT CHEST The  following report is an over-read performed by radiologist Dr. Irish Lack of [facility] on 01/09/2020. This over-read does not include interpretation of cardiac or coronary anatomy or pathology. The coronary CTA interpretation by the cardiologist is attached. COMPARISON:  None. FINDINGS: Vascular: No significant vascular findings. Normal heart size. No pericardial effusion. Mediastinum/Nodes: Visualized mediastinum and hilar regions demonstrate no lymphadenopathy or masses. Lungs/Pleura: Visualized lungs show no evidence of pulmonary edema, consolidation, pneumothorax, nodule or pleural fluid. Upper Abdomen: No acute abnormality. Musculoskeletal: No chest wall mass or suspicious bone lesions identified. IMPRESSION: No significant incidental findings. Electronically Signed: By: Irish Lack M.D. On: 01/09/2020 08:27   CT CORONARY FRACTIONAL FLOW RESERVE DATA PREP  Result Date: 03/04/2020 CLINICAL DATA:  Chest pain EXAM: CT FFR MEDICATIONS: No additional medications TECHNIQUE: Coronary CTA was sent for FFR. FINDINGS: No modeled stenoses > 30%. IMPRESSION: No evidence for hemodynamically significant coronary disease. Dalton Mclean Electronically Signed   By: Marca Ancona M.D.   On: 01/10/2020 17:44   CT CORONARY FRACTIONAL FLOW RESERVE FLUID ANALYSIS  Result Date: 03/04/2020 CLINICAL DATA:  Chest pain EXAM: CT FFR MEDICATIONS: No additional medications TECHNIQUE: Coronary CTA was sent for FFR. FINDINGS: No modeled  stenoses > 30%. IMPRESSION: No evidence for hemodynamically significant coronary disease. Dalton Mclean Electronically Signed   By: Marca Ancona M.D.   On: 01/10/2020 17:44    Assessment & Plan:   Josha was seen today for follow-up.  Diagnoses and all orders for this visit:  Depression, recurrent (HCC) -     sertraline (ZOLOFT) 100 MG tablet; Take 1.5 tablets (150 mg total) by mouth daily.  Precordial chest pain  Other orders -     traZODone (DESYREL) 50 MG tablet; Take 0.5-1 tablets (25-50 mg total) by mouth at bedtime as needed for sleep. -     valsartan-hydrochlorothiazide (DIOVAN-HCT) 160-12.5 MG tablet; Take 1 tablet by mouth daily. -     atorvastatin (LIPITOR) 40 MG tablet; Take 1 tablet (40 mg total) by mouth every evening.     Meds ordered this encounter  Medications  . sertraline (ZOLOFT) 100 MG tablet    Sig: Take 1.5 tablets (150 mg total) by mouth daily.    Dispense:  135 tablet    Refill:  3  . traZODone (DESYREL) 50 MG tablet    Sig: Take 0.5-1 tablets (25-50 mg total) by mouth at bedtime as needed for sleep.    Dispense:  90 tablet    Refill:  3  . valsartan-hydrochlorothiazide (DIOVAN-HCT) 160-12.5 MG tablet    Sig: Take 1 tablet by mouth daily.    Dispense:  90 tablet    Refill:  3  . atorvastatin (LIPITOR) 40 MG tablet    Sig: Take 1 tablet (40 mg total) by mouth every evening.    Dispense:  90 tablet    Refill:  3     Follow-up: No follow-ups on file.  Sonda Primes, MD

## 2020-04-01 NOTE — Assessment & Plan Note (Signed)
Nexium po 

## 2020-04-01 NOTE — Assessment & Plan Note (Signed)
On Vit D 

## 2020-04-01 NOTE — Assessment & Plan Note (Signed)
Wt Readings from Last 3 Encounters:  04/01/20 215 lb (97.5 kg)  01/08/20 208 lb (94.3 kg)  12/24/19 212 lb (96.2 kg)

## 2020-04-06 MED FILL — FLUTICASONE PROP 50 MCG SPR: 50 | 30 days supply | Qty: 16 | Fill #2

## 2021-01-15 DIAGNOSIS — Z114 Encounter for screening for human immunodeficiency virus [HIV]: Secondary | ICD-10-CM | POA: Diagnosis not present

## 2021-01-15 DIAGNOSIS — L237 Allergic contact dermatitis due to plants, except food: Secondary | ICD-10-CM | POA: Diagnosis not present

## 2021-01-15 DIAGNOSIS — R609 Edema, unspecified: Secondary | ICD-10-CM | POA: Diagnosis not present

## 2021-01-15 DIAGNOSIS — I2510 Atherosclerotic heart disease of native coronary artery without angina pectoris: Secondary | ICD-10-CM | POA: Diagnosis not present

## 2021-01-15 DIAGNOSIS — E782 Mixed hyperlipidemia: Secondary | ICD-10-CM | POA: Diagnosis not present

## 2021-01-15 DIAGNOSIS — I10 Essential (primary) hypertension: Secondary | ICD-10-CM | POA: Diagnosis not present

## 2021-01-15 DIAGNOSIS — F17210 Nicotine dependence, cigarettes, uncomplicated: Secondary | ICD-10-CM | POA: Diagnosis not present

## 2021-01-15 DIAGNOSIS — Z1211 Encounter for screening for malignant neoplasm of colon: Secondary | ICD-10-CM | POA: Diagnosis not present

## 2021-01-15 DIAGNOSIS — F331 Major depressive disorder, recurrent, moderate: Secondary | ICD-10-CM | POA: Diagnosis not present

## 2021-01-15 DIAGNOSIS — Z6831 Body mass index (BMI) 31.0-31.9, adult: Secondary | ICD-10-CM | POA: Diagnosis not present

## 2021-01-15 DIAGNOSIS — Z0000 Encounter for general adult medical examination without abnormal findings: Secondary | ICD-10-CM | POA: Diagnosis not present

## 2021-01-15 DIAGNOSIS — Z125 Encounter for screening for malignant neoplasm of prostate: Secondary | ICD-10-CM | POA: Diagnosis not present

## 2021-01-15 DIAGNOSIS — Z1159 Encounter for screening for other viral diseases: Secondary | ICD-10-CM | POA: Diagnosis not present

## 2021-01-15 DIAGNOSIS — E6609 Other obesity due to excess calories: Secondary | ICD-10-CM | POA: Diagnosis not present

## 2021-01-15 DIAGNOSIS — R062 Wheezing: Secondary | ICD-10-CM | POA: Diagnosis not present

## 2021-01-15 DIAGNOSIS — G4726 Circadian rhythm sleep disorder, shift work type: Secondary | ICD-10-CM | POA: Diagnosis not present

## 2021-02-17 DIAGNOSIS — G4726 Circadian rhythm sleep disorder, shift work type: Secondary | ICD-10-CM | POA: Diagnosis not present

## 2021-02-17 DIAGNOSIS — F17210 Nicotine dependence, cigarettes, uncomplicated: Secondary | ICD-10-CM | POA: Diagnosis not present

## 2021-02-17 DIAGNOSIS — D7589 Other specified diseases of blood and blood-forming organs: Secondary | ICD-10-CM | POA: Diagnosis not present

## 2021-02-17 DIAGNOSIS — F1020 Alcohol dependence, uncomplicated: Secondary | ICD-10-CM | POA: Diagnosis not present

## 2021-02-17 DIAGNOSIS — F331 Major depressive disorder, recurrent, moderate: Secondary | ICD-10-CM | POA: Diagnosis not present

## 2021-02-17 DIAGNOSIS — R7401 Elevation of levels of liver transaminase levels: Secondary | ICD-10-CM | POA: Diagnosis not present

## 2021-02-17 DIAGNOSIS — E669 Obesity, unspecified: Secondary | ICD-10-CM | POA: Diagnosis not present

## 2021-02-17 DIAGNOSIS — I10 Essential (primary) hypertension: Secondary | ICD-10-CM | POA: Diagnosis not present

## 2021-02-24 ENCOUNTER — Other Ambulatory Visit (HOSPITAL_COMMUNITY): Payer: Self-pay

## 2021-02-24 MED ORDER — ALBUTEROL SULFATE HFA 108 (90 BASE) MCG/ACT IN AERS
2.0000 | INHALATION_SPRAY | Freq: Four times a day (QID) | RESPIRATORY_TRACT | 4 refills | Status: DC | PRN
  Filled 2021-02-24: qty 6.7, 25d supply, fill #0
  Filled 2021-04-05: qty 6.7, 25d supply, fill #1
  Filled 2021-05-18: qty 6.7, 25d supply, fill #2
  Filled 2021-06-16: qty 6.7, 25d supply, fill #3
  Filled 2021-08-04 – 2021-08-16 (×2): qty 6.7, 25d supply, fill #4

## 2021-02-24 MED ORDER — ROSUVASTATIN CALCIUM 10 MG PO TABS
10.0000 mg | ORAL_TABLET | Freq: Every day | ORAL | 5 refills | Status: DC
Start: 2021-02-08 — End: 2021-09-23
  Filled 2021-02-24: qty 30, 30d supply, fill #0
  Filled 2021-04-05: qty 30, 30d supply, fill #1
  Filled 2021-05-18: qty 30, 30d supply, fill #2
  Filled 2021-08-02: qty 30, 30d supply, fill #3

## 2021-02-24 MED ORDER — HYDROCHLOROTHIAZIDE 12.5 MG PO CAPS
12.5000 mg | ORAL_CAPSULE | Freq: Every morning | ORAL | 5 refills | Status: DC
Start: 2021-02-17 — End: 2021-09-23
  Filled 2021-02-24: qty 30, 30d supply, fill #0
  Filled 2021-04-05: qty 30, 30d supply, fill #1
  Filled 2021-05-18: qty 30, 30d supply, fill #2
  Filled 2021-08-02: qty 30, 30d supply, fill #3

## 2021-02-24 MED ORDER — BUPROPION HCL ER (XL) 300 MG PO TB24
300.0000 mg | ORAL_TABLET | Freq: Every day | ORAL | 2 refills | Status: DC
  Filled 2021-02-24 – 2021-04-20 (×2): qty 30, 30d supply, fill #0
  Filled 2021-05-18: qty 30, 30d supply, fill #1
  Filled 2021-06-15: qty 30, 30d supply, fill #2

## 2021-02-24 MED ORDER — DOXEPIN HCL 10 MG PO CAPS
20.0000 mg | ORAL_CAPSULE | Freq: Every evening | ORAL | 1 refills | Status: DC
  Filled 2021-02-24: qty 30, 15d supply, fill #0
  Filled 2021-04-05: qty 20, 10d supply, fill #1

## 2021-02-24 MED ORDER — VALSARTAN 160 MG PO TABS
160.0000 mg | ORAL_TABLET | Freq: Every day | ORAL | 5 refills | Status: DC
Start: 2021-02-17 — End: 2021-09-23
  Filled 2021-02-24: qty 30, 30d supply, fill #0
  Filled 2021-04-05: qty 30, 30d supply, fill #1
  Filled 2021-05-18: qty 30, 30d supply, fill #2
  Filled 2021-08-02: qty 30, 30d supply, fill #3

## 2021-02-25 ENCOUNTER — Other Ambulatory Visit (HOSPITAL_COMMUNITY): Payer: Self-pay

## 2021-03-01 ENCOUNTER — Other Ambulatory Visit (HOSPITAL_COMMUNITY): Payer: Self-pay

## 2021-03-22 DIAGNOSIS — D7589 Other specified diseases of blood and blood-forming organs: Secondary | ICD-10-CM | POA: Diagnosis not present

## 2021-03-22 DIAGNOSIS — F1020 Alcohol dependence, uncomplicated: Secondary | ICD-10-CM | POA: Diagnosis not present

## 2021-03-22 DIAGNOSIS — J449 Chronic obstructive pulmonary disease, unspecified: Secondary | ICD-10-CM | POA: Diagnosis not present

## 2021-03-22 DIAGNOSIS — F331 Major depressive disorder, recurrent, moderate: Secondary | ICD-10-CM | POA: Diagnosis not present

## 2021-03-22 DIAGNOSIS — R7401 Elevation of levels of liver transaminase levels: Secondary | ICD-10-CM | POA: Diagnosis not present

## 2021-03-22 DIAGNOSIS — F17210 Nicotine dependence, cigarettes, uncomplicated: Secondary | ICD-10-CM | POA: Diagnosis not present

## 2021-03-22 DIAGNOSIS — I10 Essential (primary) hypertension: Secondary | ICD-10-CM | POA: Diagnosis not present

## 2021-03-22 DIAGNOSIS — E538 Deficiency of other specified B group vitamins: Secondary | ICD-10-CM | POA: Diagnosis not present

## 2021-03-22 DIAGNOSIS — G4726 Circadian rhythm sleep disorder, shift work type: Secondary | ICD-10-CM | POA: Diagnosis not present

## 2021-03-30 DIAGNOSIS — R748 Abnormal levels of other serum enzymes: Secondary | ICD-10-CM | POA: Diagnosis not present

## 2021-03-30 DIAGNOSIS — R932 Abnormal findings on diagnostic imaging of liver and biliary tract: Secondary | ICD-10-CM | POA: Diagnosis not present

## 2021-03-30 DIAGNOSIS — K760 Fatty (change of) liver, not elsewhere classified: Secondary | ICD-10-CM | POA: Diagnosis not present

## 2021-04-05 ENCOUNTER — Other Ambulatory Visit (HOSPITAL_COMMUNITY): Payer: Self-pay

## 2021-04-20 ENCOUNTER — Other Ambulatory Visit (HOSPITAL_COMMUNITY): Payer: Self-pay

## 2021-04-20 MED ORDER — DOXEPIN HCL 10 MG PO CAPS
20.0000 mg | ORAL_CAPSULE | Freq: Every day | ORAL | 1 refills | Status: DC
  Filled 2021-04-20: qty 30, 15d supply, fill #0
  Filled 2021-05-18: qty 30, 15d supply, fill #1

## 2021-05-18 ENCOUNTER — Other Ambulatory Visit (HOSPITAL_COMMUNITY): Payer: Self-pay

## 2021-06-15 ENCOUNTER — Other Ambulatory Visit (HOSPITAL_COMMUNITY): Payer: Self-pay

## 2021-06-15 ENCOUNTER — Other Ambulatory Visit: Payer: Self-pay | Admitting: Internal Medicine

## 2021-06-15 MED ORDER — SERTRALINE HCL 100 MG PO TABS
ORAL_TABLET | ORAL | 0 refills | Status: DC
  Filled 2021-06-15: qty 45, 30d supply, fill #0

## 2021-06-15 MED ORDER — TRAZODONE HCL 50 MG PO TABS
ORAL_TABLET | ORAL | 0 refills | Status: DC
  Filled 2021-06-15: qty 30, 30d supply, fill #0

## 2021-06-15 MED ORDER — ESOMEPRAZOLE MAGNESIUM 20 MG PO CPDR
20.0000 mg | DELAYED_RELEASE_CAPSULE | Freq: Every day | ORAL | 0 refills | Status: DC
  Filled 2021-06-15: qty 30, 30d supply, fill #0

## 2021-06-15 MED ORDER — DOXEPIN HCL 10 MG PO CAPS
20.0000 mg | ORAL_CAPSULE | Freq: Every day | ORAL | 1 refills | Status: DC
  Filled 2021-06-15: qty 30, 15d supply, fill #0

## 2021-06-16 ENCOUNTER — Other Ambulatory Visit (HOSPITAL_COMMUNITY): Payer: Self-pay

## 2021-08-02 ENCOUNTER — Other Ambulatory Visit (HOSPITAL_COMMUNITY): Payer: Self-pay

## 2021-08-02 ENCOUNTER — Other Ambulatory Visit: Payer: Self-pay | Admitting: Internal Medicine

## 2021-08-03 ENCOUNTER — Other Ambulatory Visit (HOSPITAL_COMMUNITY): Payer: Self-pay

## 2021-08-03 MED ORDER — BUPROPION HCL ER (XL) 300 MG PO TB24
300.0000 mg | ORAL_TABLET | Freq: Every day | ORAL | 2 refills | Status: DC
  Filled 2021-08-03: qty 30, 30d supply, fill #0

## 2021-08-04 ENCOUNTER — Other Ambulatory Visit (HOSPITAL_COMMUNITY): Payer: Self-pay

## 2021-08-12 ENCOUNTER — Other Ambulatory Visit (HOSPITAL_COMMUNITY): Payer: Self-pay

## 2021-08-16 ENCOUNTER — Ambulatory Visit (HOSPITAL_COMMUNITY)
Admission: EM | Admit: 2021-08-16 | Discharge: 2021-08-16 | Disposition: A | Discharge status: Home / Self Care | Payer: 59 | Attending: Emergency Medicine | Admitting: Emergency Medicine

## 2021-08-16 ENCOUNTER — Encounter (HOSPITAL_COMMUNITY): Payer: Self-pay

## 2021-08-16 ENCOUNTER — Other Ambulatory Visit (HOSPITAL_COMMUNITY): Payer: Self-pay

## 2021-08-16 DIAGNOSIS — R0789 Other chest pain: Secondary | ICD-10-CM

## 2021-08-16 MED ORDER — CYCLOBENZAPRINE HCL 10 MG PO TABS
10.0000 mg | ORAL_TABLET | Freq: Every day | ORAL | 0 refills | Status: DC
  Filled 2021-08-16: qty 10, 10d supply, fill #0

## 2021-08-16 MED ORDER — PREDNISONE 20 MG PO TABS
40.0000 mg | ORAL_TABLET | Freq: Every day | ORAL | 0 refills | Status: DC
  Filled 2021-08-16: qty 10, 5d supply, fill #0

## 2021-08-16 NOTE — ED Provider Notes (Signed)
[facility]    CSN: [id number] Arrival date & time: 08/16/21  0801      History   Chief Complaint Chief Complaint  Patient presents with   Chest Pain   Arm Swelling    HPI Omar Ward is a 51 y.o. male.   Patient presents with right-sided chest pain for 7 days occurring intermittently.  Pain can only be felt with deep breathing and movement of the right arm.  Endorses swelling to the right axilla beginning 3 to 4 days ago.  Has attempted use of Advil which has been ineffective.  Denies injury or trauma, shortness of breath, wheezing, cough, abdominal distention, abdominal pain.  History of hypertension, hyperlipidemia, COPD, sleep apnea, daily tobacco use.  Has been taking amoxicillin.  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 Authorizing Provider  albuterol (VENTOLIN HFA) 108 (90 Base) MCG/ACT inhaler Inhale 2 puffs into the lungs every 6 (six) hours as needed for wheezing 01/19/21  Yes   aspirin EC 81 MG tablet Take 81 mg by mouth daily. Swallow whole.   Yes [provider]  atorvastatin (LIPITOR) 40  MG tablet Take 1 tablet (40 mg total) by mouth every evening. 04/01/20  Yes Plotnikov, Georgina Quint, MD  buPROPion (WELLBUTRIN XL) 300 MG 24 hr tablet Take 1 tablet (300 mg total) by mouth daily. 08/03/21  Yes   Cholecalciferol (VITAMIN D3) 50 MCG (2000 UT) capsule Take 1 capsule (2,000 Units total) by mouth daily. 04/01/20  Yes Plotnikov, Georgina Quint, MD  doxepin (SINEQUAN) 10 MG capsule Take 2 capsules (20 mg total) by mouth at bedtime. 06/15/21  Yes   fluticasone (FLONASE) 50 MCG/ACT nasal spray Place 2 sprays into both nostrils daily. 07/02/19  Yes Chambliss, Estill Batten, MD  rosuvastatin (CRESTOR) 10 MG tablet Take 1 tablet (10 mg total) by mouth daily. 02/08/21  Yes   traZODone (DESYREL) 50 MG tablet Take 0.5-1 tablets (25-50 mg total) by mouth at bedtime as needed for sleep. 04/01/20  Yes Plotnikov, Georgina Quint, MD  atorvastatin (LIPITOR) 40 MG tablet TAKE 1 TABLET (40 MG TOTAL) BY MOUTH EVERY EVENING. 04/01/20 04/01/21  Plotnikov, Georgina Quint, MD  atorvastatin (LIPITOR) 40 MG tablet TAKE 1 TABLET BY MOUTH EVERY EVENING 02/12/20 02/11/21  Plotnikov, Georgina Quint, MD  atorvastatin (LIPITOR) 40 MG tablet TAKE 1 TABLET BY MOUTH EVERY EVENING 12/02/19 12/01/20  Coen, Crystal, FNP  fluticasone (FLONASE) 50 MCG/ACT nasal spray PLACE 2 SPRAYS INTO BOTH NOSTRILS DAILY. 07/02/19 07/01/20  Carney Living, MD  hydrochlorothiazide (MICROZIDE) 12.5 MG  capsule Take 1 capsule (12.5 mg total) by mouth in the morning. 02/17/21     metoprolol tartrate (LOPRESSOR) 100 MG tablet TAKE 1 TABLET BY MOUTH ONCE FOR ONE DOSE 2 HOURS PRIOR TO CT SCAN OF HEART. 12/24/19 12/23/20  Minus Breeding, MD  nitroGLYCERIN (NITROSTAT) 0.4 MG SL tablet PLACE 1 TABLET UNDER THE TONGUE EVERY 5 MINUTES AS NEEDED FOR CHEST PAIN. 12/02/19 12/01/20  Bertha Stakes, FNP  nitroGLYCERIN (NITROSTAT) 0.6 MG SL tablet Place 0.4 mg under the tongue every 5 (five) minutes as needed for chest pain.    [provider]  sertraline (ZOLOFT) 100 MG tablet TAKE 1.5  TABLETS (150 MG TOTAL) BY MOUTH DAILY. 07/02/19 07/01/20  Lind Covert, MD  traZODone (DESYREL) 50 MG tablet TAKE 0.5-1 TABLETS (25-50 MG TOTAL) BY MOUTH AT BEDTIME AS NEEDED FOR SLEEP. 06/15/21 06/15/22  Plotnikov, Evie Lacks, MD  traZODone (DESYREL) 50 MG tablet TAKE 0.5-1 TABLETS (25-50 MG TOTAL) BY MOUTH AT BEDTIME AS NEEDED FOR SLEEP. 01/08/20 01/07/21  Plotnikov, Evie Lacks, MD  valsartan (DIOVAN) 160 MG tablet Take 1 tablet (160 mg total) by mouth daily. 02/17/21     valsartan-hydrochlorothiazide (DIOVAN-HCT) 160-12.5 MG tablet Take 1 tablet by mouth daily. 04/01/20   Plotnikov, Evie Lacks, MD  valsartan-hydrochlorothiazide (DIOVAN-HCT) 160-12.5 MG tablet TAKE 1 TABLET BY MOUTH DAILY. 04/01/20 04/01/21  Plotnikov, Evie Lacks, MD  valsartan-hydrochlorothiazide (DIOVAN-HCT) 160-12.5 MG tablet TAKE 1 TABLET BY MOUTH DAILY. 01/08/20 01/07/21  Plotnikov, Evie Lacks, MD  valsartan-hydrochlorothiazide (DIOVAN-HCT) 160-12.5 MG tablet TAKE 1 TABLET BY MOUTH ONCE A DAY 12/02/19 12/01/20  Bertha Stakes, FNP    Family History Family History  Problem Relation Age of Onset   Diabetes Mother    COPD Mother    Hypertension Father    Diabetes Father    Heart disease Father        Pacemaker   CAD Sister 48       Stent    Social History Social History   Tobacco Use   Smoking status: Every Day    Types: Cigarettes   Smokeless tobacco: Never   Tobacco comments:    1/2 ppd   Substance Use Topics   Alcohol use: Yes    Alcohol/week: 3.0 - 6.0 standard drinks of alcohol    Types: 3 - 6 Cans of beer per week   Drug use: No     Allergies   Amlodipine   Review of Systems Review of Systems  Constitutional: Negative.   Respiratory:  Negative for apnea, cough, choking, chest tightness, shortness of breath, wheezing and stridor.   Cardiovascular:  Positive for chest pain. Negative for palpitations and leg swelling.  Gastrointestinal: Negative.   Skin: Negative.   Neurological: Negative.       Physical Exam Triage Vital Signs ED Triage Vitals  Enc Vitals Group     BP 08/16/21 0816 136/87     Pulse Rate 08/16/21 0816 76     Resp 08/16/21 0816 16     Temp 08/16/21 0816 98.3 F (36.8 C)     Temp Source 08/16/21 0816 Oral     SpO2 08/16/21 0816 93 %     Weight 08/16/21 0818 214 lb 15.2 oz (97.5 kg)     Height 08/16/21 0818 5\' 9"  (1.753 m)     Head Circumference --      Peak Flow --      Pain Score 08/16/21 0817 6     Pain Loc --  Pain Edu? --      Excl. in GC? --    No data found.  Updated Vital Signs BP 136/87 (BP Location: Left Arm)   Pulse 76   Temp 98.3 F (36.8 C) (Oral)   Resp 16   Ht 5\' 9"  (1.753 m)   Wt 214 lb 15.2 oz (97.5 kg)   SpO2 93%   BMI 31.74 kg/m   Visual Acuity Right Eye Distance:   Left Eye Distance:   Bilateral Distance:    Right Eye Near:   Left Eye Near:    Bilateral Near:     Physical Exam Constitutional:      Appearance: Normal appearance.  Eyes:     Extraocular Movements: Extraocular movements intact.  Cardiovascular:     Rate and Rhythm: Normal rate and regular rhythm.     Pulses: Normal pulses.     Heart sounds: Normal heart sounds.  Pulmonary:     Effort: Pulmonary effort is normal.     Breath sounds: Normal breath sounds.  Chest:     Comments: Tenderness present over the right chest wall extending into the right axilla with mild to moderate swelling underneath the axilla, no ecchymosis noted, chest wall symmetrical and without crepitus Neurological:     Mental Status: He is alert and oriented to person, place, and time. Mental status is at baseline.  Psychiatric:        Mood and Affect: Mood normal.        Behavior: Behavior normal.      UC Treatments / Results  Labs (all labs ordered are listed, but only abnormal results are displayed) Labs Reviewed - No data to display  EKG   Radiology No results found.  Procedures Procedures (including critical care time)  Medications Ordered in  UC Medications - No data to display  Initial Impression / Assessment and Plan / UC Course  I have reviewed the triage vital signs and the nursing notes.  Pertinent labs & imaging results that were available during my care of the patient were reviewed by me and considered in my medical decision making (see chart for details).  Chest wall pain  Vital signs are stable, patient is in no signs of distress, low suspicion for cardiac or respiratory involvement at this time, mild wheezing heard to the bilateral lower lobes, S1 and S2 heard to auscultation, EKG showing normal sinus rhythm with occasional PVCs, PVCs are new finding in comparison to the 2021 EKG, discussed with patient, not emergent at this time, patient endorses upcoming PCP appointment later this week, may follow-up regarding EKG changes at that time and for reevaluation of current symptoms, will move forward today with treatment for muscular injury, prednisone and Flexeril prescribed for outpatient management, may use ice or heat over the affected area, pillows for support and daily stretching and massage as tolerated Final Clinical Impressions(s) / UC Diagnoses   Final diagnoses:  None   Discharge Instructions   None    ED Prescriptions   None    PDMP not reviewed this encounter.   2022, Valinda Hoar 08/16/21 [phone]

## 2021-08-16 NOTE — Discharge Instructions (Addendum)
At this time we will move forward with treatment for muscular pain as I am able to reproduce tenderness over the chest wall, low suspicion for involvement of your heart or lungs as symptoms have been going on for 7 days and there are no accompanying respiratory symptoms  Your EKG shows that your heart is beating in a regular pace and rhythm but occasionally is doing an extra beat nonemergent and can be followed up at your upcoming PCP appointment  Your vital signs today are all within normal limits  He did not have a low suspicion for lung involvement as she is already started on amoxicillin course you can continue for an additional 5 days.,  Taking twice daily, this will provide coverage for bacteria in the upper airways that can cause pneumonia  At any point if your symptoms worsen in severity even with use of medication please go to the nearest emergency department for further evaluation and cardiac work-up  Please notify your primary doctor at upcoming visit for reevaluation  Begin use of prednisone every morning with food for the next 5 days, this medication helps to reduce swelling which will help with your pain   You may use a muscle relaxer at bedtime as needed for additional comfort, be mindful of this medication  You may place ice or heat over the affected area 10 to 15-minute intervals  You may place pillows underneath the arm for additional support and comfort  May complete stretching and massage as tolerated

## 2021-08-16 NOTE — ED Triage Notes (Signed)
Patient having right sided chest pain with swelling under the right arm pit. States the chest pain is aggravated by deep breaths.  Onset 6-7 days ago. Patient states he had some amoxicillin at home that he took to try to help but this did nothing.   No known injuries or falls.

## 2021-09-21 ENCOUNTER — Other Ambulatory Visit (HOSPITAL_COMMUNITY)
Admission: EM | Admit: 2021-09-21 | Discharge: 2021-09-24 | Disposition: A | Discharge status: Home / Self Care | Payer: 59 | Attending: Psychiatry | Admitting: Psychiatry

## 2021-09-21 DIAGNOSIS — F1020 Alcohol dependence, uncomplicated: Secondary | ICD-10-CM | POA: Diagnosis not present

## 2021-09-21 DIAGNOSIS — F419 Anxiety disorder, unspecified: Secondary | ICD-10-CM | POA: Insufficient documentation

## 2021-09-21 DIAGNOSIS — T465X6A Underdosing of other antihypertensive drugs, initial encounter: Secondary | ICD-10-CM | POA: Insufficient documentation

## 2021-09-21 DIAGNOSIS — F17210 Nicotine dependence, cigarettes, uncomplicated: Secondary | ICD-10-CM | POA: Diagnosis not present

## 2021-09-21 DIAGNOSIS — Z20822 Contact with and (suspected) exposure to covid-19: Secondary | ICD-10-CM | POA: Insufficient documentation

## 2021-09-21 DIAGNOSIS — F1010 Alcohol abuse, uncomplicated: Secondary | ICD-10-CM | POA: Diagnosis present

## 2021-09-21 DIAGNOSIS — F1090 Alcohol use, unspecified, uncomplicated: Secondary | ICD-10-CM | POA: Diagnosis present

## 2021-09-21 LAB — CBC WITH DIFFERENTIAL/PLATELET
Abs Immature Granulocytes: 0.04 10*3/uL (ref 0.00–0.07)
Basophils Absolute: 0 10*3/uL (ref 0.0–0.1)
Basophils Relative: 0 %
Eosinophils Absolute: 0 10*3/uL (ref 0.0–0.5)
Eosinophils Relative: 0 %
HCT: 43.5 % (ref 39.0–52.0)
Hemoglobin: 15 g/dL (ref 13.0–17.0)
Immature Granulocytes: 1 %
Lymphocytes Relative: 16 %
Lymphs Abs: 1.3 10*3/uL (ref 0.7–4.0)
MCH: 34.6 pg — ABNORMAL HIGH (ref 26.0–34.0)
MCHC: 34.5 g/dL (ref 30.0–36.0)
MCV: 100.2 fL — ABNORMAL HIGH (ref 80.0–100.0)
Monocytes Absolute: 0.7 10*3/uL (ref 0.1–1.0)
Monocytes Relative: 8 %
Neutro Abs: 6.1 10*3/uL (ref 1.7–7.7)
Neutrophils Relative %: 75 %
Platelets: 129 10*3/uL — ABNORMAL LOW (ref 150–400)
RBC: 4.34 MIL/uL (ref 4.22–5.81)
RDW: 14.2 % (ref 11.5–15.5)
WBC: 8.1 10*3/uL (ref 4.0–10.5)
nRBC: 0 % (ref 0.0–0.2)

## 2021-09-21 LAB — COMPREHENSIVE METABOLIC PANEL
ALT: 66 U/L — ABNORMAL HIGH (ref 0–44)
AST: 65 U/L — ABNORMAL HIGH (ref 15–41)
Albumin: 4.4 g/dL (ref 3.5–5.0)
Alkaline Phosphatase: 66 U/L (ref 38–126)
Anion gap: 17 — ABNORMAL HIGH (ref 5–15)
BUN: 5 mg/dL — ABNORMAL LOW (ref 6–20)
CO2: 24 mmol/L (ref 22–32)
Calcium: 8.9 mg/dL (ref 8.9–10.3)
Chloride: 97 mmol/L — ABNORMAL LOW (ref 98–111)
Creatinine, Ser: 1.01 mg/dL (ref 0.61–1.24)
GFR, Estimated: >60 mL/min (ref >60)
Glucose, Bld: 114 mg/dL — ABNORMAL HIGH (ref 70–99)
Potassium: 3.6 mmol/L (ref 3.5–5.1)
Sodium: 138 mmol/L (ref 135–145)
Total Bilirubin: 1 mg/dL (ref 0.3–1.2)
Total Protein: 7.4 g/dL (ref 6.5–8.1)

## 2021-09-21 LAB — POCT URINE DRUG SCREEN - MANUAL ENTRY (I-SCREEN)
POC Amphetamine UR: NOT DETECTED
POC Buprenorphine (BUP): NOT DETECTED
POC Cocaine UR: NOT DETECTED
POC Marijuana UR: POSITIVE — AB
POC Methadone UR: NOT DETECTED
POC Methamphetamine UR: NOT DETECTED
POC Morphine: NOT DETECTED
POC Oxazepam (BZO): NOT DETECTED
POC Oxycodone UR: NOT DETECTED
POC Secobarbital (BAR): NOT DETECTED

## 2021-09-21 LAB — URINALYSIS, ROUTINE W REFLEX MICROSCOPIC
Bacteria, UA: NONE SEEN
Bilirubin Urine: NEGATIVE
Glucose, UA: NEGATIVE mg/dL
Hgb urine dipstick: NEGATIVE
Ketones, ur: 5 mg/dL — AB
Leukocytes,Ua: NEGATIVE
Nitrite: NEGATIVE
Protein, ur: 30 mg/dL — AB
Specific Gravity, Urine: 1.016 (ref 1.005–1.030)
pH: 9 — ABNORMAL HIGH (ref 5.0–8.0)

## 2021-09-21 LAB — HEMOGLOBIN A1C
Hgb A1c MFr Bld: 5.6 % (ref 4.8–5.6)
Mean Plasma Glucose: 114.02 mg/dL

## 2021-09-21 LAB — POC SARS CORONAVIRUS 2 AG -  ED: SARS Coronavirus 2 Ag: NEGATIVE

## 2021-09-21 LAB — MAGNESIUM: Magnesium: 1.4 mg/dL — ABNORMAL LOW (ref 1.7–2.4)

## 2021-09-21 LAB — LIPID PANEL
Cholesterol: 276 mg/dL — ABNORMAL HIGH (ref 0–200)
HDL: 81 mg/dL (ref >40)
LDL Cholesterol: 173 mg/dL — ABNORMAL HIGH (ref 0–99)
Total CHOL/HDL Ratio: 3.4 RATIO
Triglycerides: 109 mg/dL (ref <150)
VLDL: 22 mg/dL (ref 0–40)

## 2021-09-21 LAB — RESP PANEL BY RT-PCR (FLU A&B, COVID) ARPGX2
Influenza A by PCR: NEGATIVE
Influenza B by PCR: NEGATIVE
SARS Coronavirus 2 by RT PCR: NEGATIVE

## 2021-09-21 LAB — ETHANOL: Alcohol, Ethyl (B): <10 mg/dL (ref <10)

## 2021-09-21 LAB — TSH: TSH: 3.018 u[IU]/mL (ref 0.350–4.500)

## 2021-09-21 LAB — POC SARS CORONAVIRUS 2 AG: SARSCOV2ONAVIRUS 2 AG: NEGATIVE

## 2021-09-21 MED ORDER — LORAZEPAM 1 MG PO TABS
1.0000 mg | ORAL_TABLET | Freq: Two times a day (BID) | ORAL | Status: AC
  Administered 2021-09-23 (×2): 1 mg via ORAL
  Filled 2021-09-21 (×2): qty 1

## 2021-09-21 MED ORDER — NICOTINE 21 MG/24HR TD PT24
21.0000 mg | MEDICATED_PATCH | Freq: Every day | TRANSDERMAL | Status: DC
  Administered 2021-09-21 – 2021-09-23 (×3): 21 mg via TRANSDERMAL
  Filled 2021-09-21 (×4): qty 1

## 2021-09-21 MED ORDER — HYDROXYZINE HCL 25 MG PO TABS
25.0000 mg | ORAL_TABLET | Freq: Four times a day (QID) | ORAL | Status: AC | PRN
  Administered 2021-09-22: 25 mg via ORAL
  Filled 2021-09-21: qty 1

## 2021-09-21 MED ORDER — LORAZEPAM 1 MG PO TABS
1.0000 mg | ORAL_TABLET | Freq: Every day | ORAL | Status: AC
  Administered 2021-09-24: 1 mg via ORAL
  Filled 2021-09-21: qty 1

## 2021-09-21 MED ORDER — HYDROCHLOROTHIAZIDE 12.5 MG PO TABS
12.5000 mg | ORAL_TABLET | Freq: Every day | ORAL | Status: DC
  Administered 2021-09-21: 12.5 mg via ORAL
  Filled 2021-09-21 (×2): qty 1

## 2021-09-21 MED ORDER — ONDANSETRON 4 MG PO TBDP: 4.0000 mg | ORAL_TABLET | Freq: Four times a day (QID) | ORAL | Status: AC | PRN

## 2021-09-21 MED ORDER — ACETAMINOPHEN 325 MG PO TABS
650.0000 mg | ORAL_TABLET | Freq: Four times a day (QID) | ORAL | Status: DC | PRN
  Administered 2021-09-22 – 2021-09-24 (×2): 650 mg via ORAL
  Filled 2021-09-21 (×2): qty 2

## 2021-09-21 MED ORDER — ADULT MULTIVITAMIN W/MINERALS CH
1.0000 | ORAL_TABLET | Freq: Every day | ORAL | Status: DC
  Administered 2021-09-21 – 2021-09-24 (×4): 1 via ORAL
  Filled 2021-09-21 (×4): qty 1

## 2021-09-21 MED ORDER — ALBUTEROL SULFATE HFA 108 (90 BASE) MCG/ACT IN AERS: 2.0000 | INHALATION_SPRAY | Freq: Four times a day (QID) | RESPIRATORY_TRACT | Status: DC | PRN

## 2021-09-21 MED ORDER — IRBESARTAN 75 MG PO TABS
150.0000 mg | ORAL_TABLET | Freq: Every day | ORAL | Status: DC
  Administered 2021-09-21: 150 mg via ORAL
  Filled 2021-09-21 (×2): qty 2

## 2021-09-21 MED ORDER — CLONIDINE HCL 0.1 MG PO TABS
0.1000 mg | ORAL_TABLET | Freq: Once | ORAL | Status: AC
  Administered 2021-09-21: 0.1 mg via ORAL
  Filled 2021-09-21: qty 1

## 2021-09-21 MED ORDER — ALUM & MAG HYDROXIDE-SIMETH 200-200-20 MG/5ML PO SUSP
30.0000 mL | ORAL | Status: DC | PRN
  Administered 2021-09-21: 30 mL via ORAL
  Filled 2021-09-21: qty 30

## 2021-09-21 MED ORDER — LOPERAMIDE HCL 2 MG PO CAPS: 2.0000 mg | ORAL_CAPSULE | ORAL | Status: AC | PRN

## 2021-09-21 MED ORDER — LORAZEPAM 1 MG PO TABS
1.0000 mg | ORAL_TABLET | Freq: Four times a day (QID) | ORAL | Status: AC
  Administered 2021-09-21 (×4): 1 mg via ORAL
  Filled 2021-09-21 (×4): qty 1

## 2021-09-21 MED ORDER — LORAZEPAM 1 MG PO TABS
1.0000 mg | ORAL_TABLET | Freq: Three times a day (TID) | ORAL | Status: AC
  Administered 2021-09-22 (×3): 1 mg via ORAL
  Filled 2021-09-21 (×3): qty 1

## 2021-09-21 MED ORDER — PANTOPRAZOLE SODIUM 40 MG PO TBEC
40.0000 mg | DELAYED_RELEASE_TABLET | Freq: Every day | ORAL | Status: DC
  Administered 2021-09-21 – 2021-09-24 (×4): 40 mg via ORAL
  Filled 2021-09-21 (×4): qty 1

## 2021-09-21 MED ORDER — MAGNESIUM HYDROXIDE 400 MG/5ML PO SUSP: 30.0000 mL | Freq: Every day | ORAL | Status: DC | PRN

## 2021-09-21 MED ORDER — ROSUVASTATIN CALCIUM 5 MG PO TABS
10.0000 mg | ORAL_TABLET | Freq: Every day | ORAL | Status: DC
  Administered 2021-09-21 – 2021-09-24 (×4): 10 mg via ORAL
  Filled 2021-09-21 (×4): qty 1

## 2021-09-21 MED ORDER — THIAMINE HCL 100 MG PO TABS
100.0000 mg | ORAL_TABLET | Freq: Every day | ORAL | Status: DC
  Administered 2021-09-22 – 2021-09-24 (×3): 100 mg via ORAL
  Filled 2021-09-21 (×3): qty 1

## 2021-09-21 MED ORDER — LORAZEPAM 1 MG PO TABS
1.0000 mg | ORAL_TABLET | Freq: Once | ORAL | Status: AC
  Administered 2021-09-21: 1 mg via ORAL
  Filled 2021-09-21: qty 1

## 2021-09-21 MED ORDER — THIAMINE HCL 100 MG/ML IJ SOLN
100.0000 mg | Freq: Once | INTRAMUSCULAR | Status: AC
  Administered 2021-09-21: 100 mg via INTRAMUSCULAR
  Filled 2021-09-21: qty 2

## 2021-09-21 MED ORDER — LORAZEPAM 1 MG PO TABS: 1.0000 mg | ORAL_TABLET | Freq: Once | ORAL | Status: DC

## 2021-09-21 MED ORDER — LORAZEPAM 1 MG PO TABS: 1.0000 mg | ORAL_TABLET | Freq: Four times a day (QID) | ORAL | Status: AC | PRN

## 2021-09-21 NOTE — BH Assessment (Addendum)
Comprehensive Clinical Assessment (CCA) Note  09/21/2021 Omar Ward [id number]  Chief Complaint:  Chief Complaint  Patient presents with   Addiction Problem   Visit Diagnosis:   F33.2 Major depressive disorder, Recurrent episode, Severe F10.20 Alcohol use disorder, Severe  Flowsheet Row ED from 09/21/2021 in [facility] ED from 08/16/2021 in [facility] RISK CATEGORY No Risk No Risk      The patient demonstrates the following risk factors for suicide: Chronic risk factors for suicide include: psychiatric disorder of major depression disorder and substance use disorder. Acute risk factors for suicide include: family or marital conflict and social withdrawal/isolation. Protective factors for this patient include: positive therapeutic relationship, coping skills, hope for the future, and life satisfaction. Considering these factors, the overall suicide risk at this point appears to be no risk. Patient is not appropriate for outpatient follow up.  Disposition Vernard Gambles NP, recommends observation  and continuous assessment. Disposition discussed with Tonia Brooms.    Omar Ward is a 51 year old married male who presents voluntarily to [facility] and unaccompanied.  Pt denies SI, HI, or AVH.  Pt reports he has a history of depression and has been feeling increasingly depressed due to the separation from his wife.  Pt says he has been drinking half galloon  of Vodka  every other day.  Pt says he smoke a pack of cigarettes daily.  Pt denies using any other substance use.  Pt acknowledges symptoms including, tremors, shaken, isolation,anxious, fatigue, loss of interests, feelings of worthlessness, guilt and exhausted.   Pt reports sleeping three hours during the night; also reports eating fine.  Pt identifies his primary stressor with as the separation from his wife of twenty-three years marriage.  Pt reports "I live alone with my dog,  who is going to take care of my dog".  Pt reports working full time. Pt gave TTS permission to contact his wife, Omar Ward, [phone], unsuccessful. Pt reports family history of mental illness.  Pt reports  family history of substance use.  Pt denies any history of abuse or trauma.  Pt denies any current legal problems.  Pt reports guns are kept in the house in a safe placed.  Pt says he is not currently receiving weekly outpatient therapy; also reports receiving outpatient medication management with primary doctor.  Pt reports no previous inpatient psychiatric hospitalization. Pt reports two years of sobriety attending  AA groups.  Pt reports no MAT.  Pt is dressed in a uniform, alert, oriented x 4 with soft speech and restless motor behavior.  Eye contact is normal.  Pt's mood is depressed and affect is anxious.  Thought process coherent.  Pt's insight is good and judgement is impaired.   There is no indication Pt is currently responding to internal stimuli or experiencing delusional thought content.  Pt was cooperative throughout assessment.       CCA Screening, Triage and Referral (STR)  Patient Reported Information How did you hear about [place]? Self  What Is the Reason for Your Visit/Call Today? Alcohol Addiction  How Long Has This Been Causing You Problems? 1 wk - 1 month  What Do You Feel Would Help You the Most Today? Alcohol or Drug Use Treatment   Have You Recently Had Any Thoughts About Hurting Yourself? No  Are You Planning to Commit Suicide/Harm Yourself At This time? No   Have you Recently Had Thoughts About Hurting Someone Karolee Ohs? No  Are  You Planning to Harm Someone at This Time? No  Explanation: No data recorded  Have You Used Any Alcohol or Drugs in the Past 24 Hours? Yes  How Long Ago Did You Use Drugs or Alcohol? No data recorded What Did You Use and How Much? Alcohol, 1/2 gallon vodka   Do You Currently Have a Therapist/Psychiatrist? No data  recorded Name of Therapist/Psychiatrist: No data recorded  Have You Been Recently Discharged From Any Office Practice or Programs? No data recorded Explanation of Discharge From Practice/Program: No data recorded    CCA Screening Triage Referral Assessment Type of Contact: No data recorded Telemedicine Service Delivery:   Is this Initial or Reassessment? No data recorded Date Telepsych consult ordered in [facility]:  No data recorded Time Telepsych consult ordered in [facility]:  No data recorded Location of Assessment: No data recorded Provider Location: No data recorded  Collateral Involvement: No data recorded  Does Patient Have a [place]? No data recorded Name and Contact of Legal Guardian: No data recorded If Minor and Not Living with Parent(s), Who has Custody? No data recorded Is CPS involved or ever been involved? No data recorded Is APS involved or ever been involved? No data recorded  Patient Determined To Be At Risk for Harm To Self or Others Based on Review of Patient Reported Information or Presenting Complaint? No data recorded Method: No data recorded Availability of Means: No data recorded Intent: No data recorded Notification Required: No data recorded Additional Information for Danger to Others Potential: No data recorded Additional Comments for Danger to Others Potential: No data recorded Are There Guns or Other Weapons in Your Home? No data recorded Types of Guns/Weapons: No data recorded Are These Weapons Safely Secured?                            No data recorded Who Could Verify You Are Able To Have These Secured: No data recorded Do You Have any Outstanding Charges, Pending Court Dates, Parole/Probation? No data recorded Contacted To Inform of Risk of Harm To Self or Others: No data recorded   Does Patient Present under Involuntary Commitment? No data recorded IVC Papers Initial File Date: No data recorded  [place] of Residence: No data  recorded  Patient Currently Receiving the Following Services: No data recorded  Determination of Need: Urgent (48 hours)   Options For Referral: Chemical Dependency Intensive Outpatient Therapy (CDIOP); Facility-Based Crisis     CCA Biopsychosocial Patient Reported Schizophrenia/Schizoaffective Diagnosis in Past: No   Strengths: Asking for help   Mental Health Symptoms Depression:   Difficulty Concentrating; Fatigue; Hopelessness; Sleep (too much or little)   Duration of Depressive symptoms:  Duration of Depressive Symptoms: Greater than two weeks   Mania:   None   Anxiety:    Restlessness; Worrying; Tension   Psychosis:   None   Duration of Psychotic symptoms:    Trauma:   None   Obsessions:   Disrupts routine/functioning; Recurrent & persistent thoughts/impulses/images   Compulsions:   Disrupts with routine/functioning; "Driven" to perform behaviors/acts   Inattention:   None   Hyperactivity/Impulsivity:   None   Oppositional/Defiant Behaviors:   None   Emotional Irregularity:   None   Other Mood/Personality Symptoms:   Depressed    Mental Status Exam Appearance and self-care  Stature:   Average   Weight:   Overweight   Clothing:   Casual   Grooming:   Normal  Cosmetic use:   Age appropriate   Posture/gait:   Tense   Motor activity:   Restless; Tremor   Sensorium  Attention:   Normal   Concentration:   Anxiety interferes   Orientation:   Object; Person; Place; Situation   Recall/memory:   Normal   Affect and Mood  Affect:   Depressed   Mood:   Anxious   Relating  Eye contact:   Normal   Facial expression:   Responsive; Anxious   Attitude toward examiner:   Cooperative   Thought and Language  Speech flow:  Soft   Thought content:   Suspicious   Preoccupation:   None   Hallucinations:   None   Organization:  No data recorded  [employer] of Knowledge:   Good    Intelligence:   Average   Abstraction:   Functional   Judgement:   Impaired   Reality Testing:   Realistic   Insight:   Good   Decision Making:   Impulsive   Social Functioning  Social Maturity:   Isolates   Social Judgement:   Victimized   Stress  Stressors:   Relationship   Coping Ability:   Exhausted; Overwhelmed   Skill Deficits:   Decision making; Self-care; Self-control   Supports:   Support needed     Religion: Religion/Spirituality How Might This Affect Treatment?: UTA  Leisure/Recreation: Leisure / Recreation Do You Have Hobbies?:  (UTA)  Exercise/Diet: Exercise/Diet Do You Exercise?:  (UTA) Have You Gained or Lost A Significant Amount of Weight in the Past Six Months?: No Do You Follow a Special Diet?: No Do You Have Any Trouble Sleeping?: Yes Explanation of Sleeping Difficulties: Pt reports sleeping three hours during the night.   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?: No  Education: Education Is Patient Currently Attending School?: No Last Grade Completed: 12 Did You Attend College?: No Did You Have An Individualized Education Program (IIEP): No Did You Have Any Difficulty At School?: No Patient's Education Has Been Impacted by Current Illness: No   CCA Family/Childhood History Family and Relationship History: Family history Marital status: Married Number of Years Married: 23 What types of issues is patient dealing with in the relationship?: Seperate from his wife six months ago Additional relationship information: Pt live along with his dog Does patient have children?: Yes How many children?: 2 How is patient's relationship with their children?: distance  Childhood History:  Childhood History By whom was/is the patient raised?: Mother Did patient suffer any  verbal/emotional/physical/sexual abuse as a child?: No Did patient suffer from severe childhood neglect?: No Has patient ever been sexually abused/assaulted/raped as an adolescent or adult?: No Was the patient ever a victim of a crime or a disaster?: No Witnessed domestic violence?: No Has patient been affected by domestic violence as an adult?: No  Child/Adolescent Assessment:     CCA Substance Use Alcohol/Drug Use: Alcohol / Drug Use Pain Medications: See MRA Prescriptions: See MRA Over the Counter: See MRA History of alcohol / drug use?: Yes Longest period of sobriety (when/how long): 2 years Negative Consequences of Use: Personal relationships Withdrawal Symptoms: Sweats, Tremors Substance #1 Name of Substance 1: Alcohol 1 - Age of First Use: 14 1 - Amount (size/oz): 1/2 gallon liqour (Vodka) 1 - Frequency: every other day 1 - Duration: ongoing 1 - Last Use / Amount: 24 hrs 1 -  Method of Aquiring: UTA 1- Route of Use: Drinking                       ASAM's:  Six Dimensions of Multidimensional Assessment  Dimension 1:  Acute Intoxication and/or Withdrawal Potential:   Dimension 1:  Description of individual's past and current experiences of substance use and withdrawal: Pt reprots he has been drinking for many years, also reports two years sobriety, currently experencing withdrawal.  Dimension 2:  Biomedical Conditions and Complications:   Dimension 2:  Description of patient's biomedical conditions and  complications: Hypertension  Dimension 3:  Emotional, Behavioral, or Cognitive Conditions and Complications:  Dimension 3:  Description of emotional, behavioral, or cognitive conditions and complications: Depression  Dimension 4:  Readiness to Change:  Dimension 4:  Description of Readiness to Change criteria: contemplation  Dimension 5:  Relapse, Continued use, or Continued Problem Potential:  Dimension 5:  Relapse, continued use, or continued problem potential  critiera description: continued use  Dimension 6:  Recovery/Living Environment:  Dimension 6:  Recovery/Iiving environment criteria description: Pt reports seperating from his wife and living alone  ASAM Severity Score: ASAM's Severity Rating Score: 17  ASAM Recommended Level of Treatment: ASAM Recommended Level of Treatment: Level III Residential Treatment   Substance use Disorder (SUD) Substance Use Disorder (SUD)  Checklist Symptoms of Substance Use: Continued use despite having a persistent/recurrent physical/psychological problem caused/exacerbated by use, Continued use despite persistent or recurrent social, interpersonal problems, caused or exacerbated by use, Evidence of tolerance, Evidence of withdrawal (Comment), Large amounts of time spent to obtain, use or recover from the substance(s), Persistent desire or unsuccessful efforts to cut down or control use, Presence of craving or strong urge to use, Recurrent use that results in a failure to fulfill major role obligations (work, school, home), Repeated use in physically hazardous situations, Substance(s) often taken in larger amounts or over longer times than was intended  Recommendations for Services/Supports/Treatments: Recommendations for Services/Supports/Treatments Recommendations For Services/Supports/Treatments: Facility Based Crisis  Discharge Disposition:    DSM5 Diagnoses: Patient Active Problem List   Diagnosis Date Noted   Coronary atherosclerosis 04/01/2020   Vitamin D deficiency 01/09/2020   GERD (gastroesophageal reflux disease) 01/08/2020   Weight loss 01/08/2020   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   Obesity, Class I, BMI 30-34.9 12/23/2019   Chest pain 12/03/2019   Elevated liver enzymes 02/13/2019   Alcoholism ([place]) 02/13/2019   Bilateral leg edema 01/22/2019   Depression, recurrent ([place]) 10/09/2018   Hyperlipidemia 10/09/2018   OSA on CPAP 10/09/2018   Essential hypertension 02/15/2017   Tobacco use disorder  02/15/2017     Referrals to Alternative Service(s): Referred to Alternative Service(s):   Place:   Date:   Time:    Referred to Alternative Service(s):   Place:   Date:   Time:    Referred to Alternative Service(s):   Place:   Date:   Time:    Referred to Alternative Service(s):   Place:   Date:   Time:     Leonides Schanz, Counselor

## 2021-09-21 NOTE — Progress Notes (Signed)
Received Caryn Bee from [facility], he was cooperative with the admission process and afterwards offered nourishments. He denied all of the psychiatric symptoms at the present time. He was oriented to his new environment.

## 2021-09-21 NOTE — ED Notes (Signed)
Patient BP elevated BP 160/113 P 97. Eber Jones, NP informed no new orders received. Will continue to monitor patient.

## 2021-09-21 NOTE — ED Notes (Signed)
Patient participating well in AA groups. Patient is cooperative and interacts well with staff. Respiratory is even and unlabored. No distress noted.  will continue to monitor for safety.  

## 2021-09-21 NOTE — ED Notes (Addendum)
Pt BP 156/121. Received one time order for Clonidine per C. Effie Shy NP. Will inform oncoming shift to recheck BP within an hour. Pt did receive scheduled BP meds this morning.

## 2021-09-21 NOTE — ED Notes (Signed)
Patient A&Ox4. Patient present with etoh use disorder. Patient denies SI/HI and AVH. Patient denies any physical complaints when asked. No acute distress noted. Support and encouragement provided. Routine safety checks conducted according to facility protocol. Encouraged patient to notify staff if thoughts of harm toward self or others arise. Patient verbalize understanding and agreement. Will continue to monitor for safety.

## 2021-09-21 NOTE — ED Provider Notes (Addendum)
Facility Based Crisis Admission H&P  Date: 09/21/21 Patient Name: Omar Ward MRN: [id number] Chief Complaint:  Chief Complaint  Patient presents with   Addiction Problem      Diagnoses:  Final diagnoses:  Alcoholism [facility])    HPI: Omar Ward 51 y.o., male patient presented to [facility] as a walk in alone requesting alcohol detox.  [name], 51 y.o., male patient seen face to face by this provider, consulted with Dr. Lucianne Muss; and chart reviewed on 09/21/21. Per chart review patient has a past psychiatric history of MDD. He thinks he has been diagnosed with Bipolar but this was not confirmed. He is prescribed Wellbutrin 150 mg QD but states he has not taken in over one week. He has also been out of his blood pressure medication. He is prescribed medications by his PCP Dr. Ollen Bowl. He works full time with [employer]. He lives alone and is recently separated from his spouse.  On evaluation Omar Ward reports he has been drinking daily for the past 4-5 years. However the past year he has increased his alcohol intake to up to a 1/2 gallon of vodka that he consumes over a 1-2 day period. His last drink was yesterday evening. He has had small brief periods of sobriety over the past few years. He denies any history of alcohol withdrawal seizures or DT's. He reports alcohol withdrawal symptoms today of: nausea, tremor, headache and diaphoresis. CIWA score 12.  He denies all other substance uses. He has a history of cocaine use when he was younger.  He has no previous history of any type of residential treatment.  He has attended AA groups in the past.  He has no history of any inpatient psychiatric admissions.  Nor does he have any history of any suicide attempts.  During evaluation Omar Ward is in sitting position. He is anxious, diaphoretic, tremulous and reports nausea. He is dressed in his work uniform. He is alert, oriented x 4, cooperative and attentive.  He has normal speech. He  endorses depression and anxiety.  He endorses feelings of guilt, helplessness, self isolation, decreased sleep and decreased appetite.  He is unsure if he has lost weight.  He is only sleeping 2-3 hours per night.  Objectively there is no evidence of psychosis/mania or delusional thinking.  Patient is able to converse coherently, goal directed thoughts, no distractibility, or pre-occupation. He also denies suicidal/self-harm/homicidal ideation, psychosis, and paranoia.  Patient answered question appropriately.    Discussed admission to the [facility] for alcohol detox.  Milieu and expectations while on the unit explained to patient.  He is in agreement. He is not interested in residential treatment. He wants out patient services upon discharge. He would be a good candidate for CD IOP.  PHQ 2-9:  [employer] Visit from 04/01/2020 in [facility] Visit from 12/03/2019 in [facility] Office Visit from 07/02/2019 in [facility]  Thoughts that you would be better off dead, or of hurting yourself in some way Not at all Not at all Not at all  PHQ-9 Total Score 0 16 12       Flowsheet Row ED from 09/21/2021 in [facility] ED from 08/16/2021 in [facility] RISK CATEGORY No Risk No Risk        Total Time spent with patient: 45 minutes  Musculoskeletal  Strength & Muscle Tone: within  normal limits Gait & Station: normal Patient leans: N/A  Psychiatric Specialty Exam  Presentation General Appearance: Appropriate for Environment  Eye Contact:Good  Speech:Clear and Coherent; Normal Rate  Speech Volume:Normal  Handedness:Right   Mood and Affect  Mood:Anxious; Depressed  Affect:Congruent   Thought Process  Thought Processes:Coherent  Descriptions of Associations:Intact  Orientation:Full (Time, Place and Person)  Thought Content:Logical  Diagnosis of  Schizophrenia or Schizoaffective disorder in past: No   Hallucinations:Hallucinations: None  Ideas of Reference:None  Suicidal Thoughts:Suicidal Thoughts: No  Homicidal Thoughts:Homicidal Thoughts: No   Sensorium  Memory:Immediate Good; Recent Good; Remote Good  Judgment:Good  Insight:Good   Executive Functions  Concentration:Good  Attention Span:Good  Recall:Good  Fund of Knowledge:Good  Language:Good   Psychomotor Activity  Psychomotor Activity:Psychomotor Activity: Normal   Assets  Assets:Communication Skills; Desire for Improvement; Financial Resources/Insurance; Housing; Physical Health; Resilience; Social Support   Sleep  Sleep:Sleep: Poor Number of Hours of Sleep: 3   Nutritional Assessment (For OBS and FBC admissions only) Has the patient had a weight loss or gain of 10 pounds or more in the last 3 months?: No Has the patient had a decrease in food intake/or appetite?: Yes Does the patient have dental problems?: No Does the patient have eating habits or behaviors that may be indicators of an eating disorder including binging or inducing vomiting?: No Has the patient recently lost weight without trying?: 2.0 Has the patient been eating poorly because of a decreased appetite?: 1 Malnutrition Screening Tool Score: 3    Physical Exam Vitals and nursing note reviewed.  Constitutional:      General: He is not in acute distress.    Appearance: He is well-developed. He is diaphoretic.  HENT:     Head: Normocephalic and atraumatic.  Eyes:     General:        Right eye: No discharge.        Left eye: No discharge.     Conjunctiva/sclera: Conjunctivae normal.  Cardiovascular:     Rate and Rhythm: Normal rate.  Pulmonary:     Effort: Pulmonary effort is normal. No respiratory distress.  Musculoskeletal:        General: Normal range of motion.     Cervical back: Normal range of motion.  Skin:    Coloration: Skin is not jaundiced or pale.   Neurological:     Mental Status: He is alert and oriented to person, place, and time.  Psychiatric:        Attention and Perception: Attention and perception normal.        Mood and Affect: Mood is anxious and depressed.        Speech: Speech normal.        Behavior: Behavior normal. Behavior is cooperative.        Thought Content: Thought content normal.        Cognition and Memory: Cognition normal.        Judgment: Judgment normal.    Review of Systems  Constitutional: Negative.   HENT: Negative.    Eyes: Negative.   Respiratory: Negative.    Cardiovascular: Negative.   Musculoskeletal: Negative.   Skin: Negative.   Neurological:  Positive for tremors.  Psychiatric/Behavioral:  Positive for depression and substance abuse. The patient is nervous/anxious.     Blood pressure (!) 160/113, pulse 97, temperature 98.8 F (37.1 C), temperature source Oral, resp. rate 20, SpO2 98 %. There is no height or weight on file to calculate BMI.  Past Psychiatric History:MDD  and alcohol use   Is the patient at risk to self? No  Has the patient been a risk to self in the past 6 months? No .    Has the patient been a risk to self within the distant past? No   Is the patient a risk to others? No   Has the patient been a risk to others in the past 6 months? No   Has the patient been a risk to others within the distant past? No   Past Medical History:  Past Medical History:  Diagnosis Date   Dyslipidemia    Hernia, abdominal    Hypertension    PUD (peptic ulcer disease)    Sleep apnea    CPAP    Past Surgical History:  Procedure Laterality Date   Knee arthrocopy       Family History:  Family History  Problem Relation Age of Onset   Diabetes Mother    COPD Mother    Hypertension Father    Diabetes Father    Heart disease Father        Pacemaker   CAD Sister 34       Stent    Social History:  Social History   Socioeconomic History   Marital status: Married    Spouse  name: Not on file   Number of children: Not on file   Years of education: Not on file   Highest education level: Not on file  Occupational History   Not on file  Tobacco Use   Smoking status: Every Day    Types: Cigarettes   Smokeless tobacco: Never   Tobacco comments:    1/2 ppd   Substance and Sexual Activity   Alcohol use: Yes    Alcohol/week: 3.0 - 6.0 standard drinks of alcohol    Types: 3 - 6 Cans of beer per week   Drug use: No   Sexual activity: Not Currently  Other Topics Concern   Not on file  Social History Narrative   Lives with his second wife.  Has farm animals and likes to work on cars. Likes to fish and hunt.  No regular exercise.   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 [employer]    Social Determinants of Health   Financial Resource Strain: Not on file  Food Insecurity: Not on file  Transportation Needs: Not on file  Physical Activity: Not on file  Stress: Not on file  Social Connections: Not on file  Intimate Partner Violence: Not on file    SDOH:  SDOH Screenings   Alcohol Screen: Not on file  Depression (PHQ2-9): Low Risk  (04/01/2020)   Depression (PHQ2-9)    PHQ-2 Score: 0  Financial Resource Strain: Not on file  Food Insecurity: Not on file  Housing: Not on file  Physical Activity: Not on file  Social Connections: Not on file  Stress: Not on file  Tobacco Use: High Risk (08/16/2021)   Patient History    Smoking Tobacco Use: Every Day    Smokeless Tobacco Use: Never    Passive Exposure: Not on file  Transportation Needs: Not on file    Last Labs:  Admission on 09/21/2021  Component Date Value Ref Range Status   SARS Coronavirus 2 by RT PCR 09/21/2021 NEGATIVE  NEGATIVE Final   Comment: (NOTE) SARS-CoV-2 target nucleic acids are NOT DETECTED.  The SARS-CoV-2 RNA is generally detectable in upper respiratory specimens during the acute  phase of infection. The lowest concentration of SARS-CoV-2 viral  copies this assay can detect is 138 copies/mL. A negative result does not preclude SARS-Cov-2 infection and should not be used as the sole basis for treatment or other patient management decisions. A negative result may occur with  improper specimen collection/handling, submission of specimen other than nasopharyngeal swab, presence of viral mutation(s) within the areas targeted by this assay, and inadequate number of viral copies(<138 copies/mL). A negative result must be combined with clinical observations, patient history, and epidemiological information. The expected result is Negative.  Fact Sheet for Patients:  BloggerCourse.com  Fact Sheet for Healthcare Providers:  SeriousBroker.it  This test is no                          t yet approved or cleared by the [place] FDA and  has been authorized for detection and/or diagnosis of SARS-CoV-2 by FDA under an Emergency Use Authorization (EUA). This EUA will remain  in effect (meaning this test can be used) for the duration of the COVID-19 declaration under Section 564(b)(1) of the Act, 21 U.S.C.section 360bbb-3(b)(1), unless the authorization is terminated  or revoked sooner.       Influenza A by PCR 09/21/2021 NEGATIVE  NEGATIVE Final   Influenza B by PCR 09/21/2021 NEGATIVE  NEGATIVE Final   Comment: (NOTE) The Xpert Xpress SARS-CoV-2/FLU/RSV plus assay is intended as an aid in the diagnosis of influenza from Nasopharyngeal swab specimens and should not be used as a sole basis for treatment. Nasal washings and aspirates are unacceptable for Xpert Xpress SARS-CoV-2/FLU/RSV testing.  Fact Sheet for Patients: BloggerCourse.com  Fact Sheet for Healthcare Providers: SeriousBroker.it  This test is not yet approved or cleared by the [place] FDA and has been authorized for detection and/or diagnosis of SARS-CoV-2 by FDA  under an Emergency Use Authorization (EUA). This EUA will remain in effect (meaning this test can be used) for the duration of the COVID-19 declaration under Section 564(b)(1) of the Act, 21 U.S.C. section 360bbb-3(b)(1), unless the authorization is terminated or revoked.  Performed at [facility], [address] [place], [place] [address]    WBC 09/21/2021 8.1  4.0 - 10.5 K/uL Final   RBC 09/21/2021 4.34  4.22 - 5.81 MIL/uL Final   Hemoglobin 09/21/2021 15.0  13.0 - 17.0 g/dL Final   HCT 27/74/1287 43.5  39.0 - 52.0 % Final   MCV 09/21/2021 100.2 (H)  80.0 - 100.0 fL Final   MCH 09/21/2021 34.6 (H)  26.0 - 34.0 pg Final   MCHC 09/21/2021 34.5  30.0 - 36.0 g/dL Final   RDW 86/76/7209 14.2  11.5 - 15.5 % Final   Platelets 09/21/2021 129 (L)  150 - 400 K/uL Final   nRBC 09/21/2021 0.0  0.0 - 0.2 % Final   Neutrophils Relative % 09/21/2021 75  % Final   Neutro Abs 09/21/2021 6.1  1.7 - 7.7 K/uL Final   Lymphocytes Relative 09/21/2021 16  % Final   Lymphs Abs 09/21/2021 1.3  0.7 - 4.0 K/uL Final   Monocytes Relative 09/21/2021 8  % Final   Monocytes Absolute 09/21/2021 0.7  0.1 - 1.0 K/uL Final   Eosinophils Relative 09/21/2021 0  % Final   Eosinophils Absolute 09/21/2021 0.0  0.0 - 0.5 K/uL Final   Basophils Relative 09/21/2021 0  % Final   Basophils Absolute 09/21/2021 0.0  0.0 - 0.1 K/uL Final   Immature Granulocytes  09/21/2021 1  % Final   Abs Immature Granulocytes 09/21/2021 0.04  0.00 - 0.07 K/uL Final   Performed at [facility], [address] [place], [place] [address]   Sodium 09/21/2021 138  135 - 145 mmol/L Final   Potassium 09/21/2021 3.6  3.5 - 5.1 mmol/L Final   Chloride 09/21/2021 97 (L)  98 - 111 mmol/L Final   CO2 09/21/2021 24  22 - 32 mmol/L Final   Glucose, Bld 09/21/2021 114 (H)  70 - 99 mg/dL Final   Glucose reference range applies only to samples taken after fasting for at least 8 hours.   BUN 09/21/2021 5 (L)  6 - 20 mg/dL Final   Creatinine,  Ser 09/21/2021 1.01  0.61 - 1.24 mg/dL Final   Calcium 40/98/119107/25/2023 8.9  8.9 - 10.3 mg/dL Final   Total Protein 47/82/956207/25/2023 7.4  6.5 - 8.1 g/dL Final   Albumin 13/08/657807/25/2023 4.4  3.5 - 5.0 g/dL Final   AST 46/96/295207/25/2023 65 (H)  15 - 41 U/L Final   ALT 09/21/2021 66 (H)  0 - 44 U/L Final   Alkaline Phosphatase 09/21/2021 66  38 - 126 U/L Final   Total Bilirubin 09/21/2021 1.0  0.3 - 1.2 mg/dL Final   GFR, Estimated 09/21/2021 >60  >60 mL/min Final   Comment: (NOTE) Calculated using the CKD-EPI Creatinine Equation (2021)    Anion gap 09/21/2021 17 (H)  5 - 15 Final   Performed at [facility], [address] [place], [place] [address]   Hgb A1c MFr Bld 09/21/2021 5.6  4.8 - 5.6 % Final   Comment: (NOTE) Pre diabetes:          5.7%-6.4%  Diabetes:              >6.4%  Glycemic control for   <7.0% adults with diabetes    Mean Plasma Glucose 09/21/2021 114.02  mg/dL Final   Performed at [facility], [address] [place], [place] [address]   Magnesium 09/21/2021 1.4 (L)  1.7 - 2.4 mg/dL Final   Performed at [facility], [address] [place], [place] [address]   Alcohol, Ethyl (B) 09/21/2021 <10  <10 mg/dL Final   Comment: (NOTE) Lowest detectable limit for serum alcohol is 10 mg/dL.  For medical purposes only. Performed at [facility], [address] [place], [place] [address]    Cholesterol 09/21/2021 276 (H)  0 - 200 mg/dL Final   Triglycerides 34/74/259507/25/2023 109  <150 mg/dL Final   HDL 63/87/564307/25/2023 81  >40 mg/dL Final   Total CHOL/HDL Ratio 09/21/2021 3.4  RATIO Final   VLDL 09/21/2021 22  0 - 40 mg/dL Final   LDL Cholesterol 09/21/2021 173 (H)  0 - 99 mg/dL Final   Comment:        Total Cholesterol/HDL:CHD Risk Coronary Heart Disease Risk Table                     Men   Women  1/2 Average Risk   3.4   3.3  Average Risk       5.0   4.4  2 X Average Risk   9.6   7.1  3 X Average Risk  23.4   11.0        Use the calculated Patient Ratio above and the  CHD Risk Table to determine the patient's CHD Risk.        ATP III  CLASSIFICATION (LDL):  <100     mg/dL   Optimal  814-481  mg/dL   Near or Above                    Optimal  130-159  mg/dL   Borderline  856-314  mg/dL   High  >970     mg/dL   Very High Performed at [facility], [address] [place], [place] [address]    TSH 09/21/2021 3.018  0.350 - 4.500 uIU/mL Final   Comment: Performed by a 3rd Generation assay with a functional sensitivity of <=0.01 uIU/mL. Performed at [facility] [facility], [address] [place], [place] [address]    Color, Urine 09/21/2021 YELLOW  YELLOW Final   APPearance 09/21/2021 CLEAR  CLEAR Final   Specific Gravity, Urine 09/21/2021 1.016  1.005 - 1.030 Final   pH 09/21/2021 9.0 (H)  5.0 - 8.0 Final   Glucose, UA 09/21/2021 NEGATIVE  NEGATIVE mg/dL Final   Hgb urine dipstick 09/21/2021 NEGATIVE  NEGATIVE Final   Bilirubin Urine 09/21/2021 NEGATIVE  NEGATIVE Final   Ketones, ur 09/21/2021 5 (A)  NEGATIVE mg/dL Final   Protein, ur 27/74/1287 30 (A)  NEGATIVE mg/dL Final   Nitrite 86/76/7209 NEGATIVE  NEGATIVE Final   Leukocytes,Ua 09/21/2021 NEGATIVE  NEGATIVE Final   RBC / HPF 09/21/2021 0-5  0 - 5 RBC/hpf Final   WBC, UA 09/21/2021 0-5  0 - 5 WBC/hpf Final   Bacteria, UA 09/21/2021 NONE SEEN  NONE SEEN Final   Performed at [facility], [address], [place], [place] [address]   POC Amphetamine UR 09/21/2021 None Detected  NONE DETECTED (Cut Off Level 1000 ng/mL) Final   POC Secobarbital (BAR) 09/21/2021 None Detected  NONE DETECTED (Cut Off Level 300 ng/mL) Final   POC Buprenorphine (BUP) 09/21/2021 None Detected  NONE DETECTED (Cut Off Level 10 ng/mL) Final   POC Oxazepam (BZO) 09/21/2021 None Detected  NONE DETECTED (Cut Off Level 300 ng/mL) Final   POC Cocaine UR 09/21/2021 None Detected  NONE DETECTED (Cut Off Level 300 ng/mL) Final   POC Methamphetamine UR 09/21/2021 None Detected  NONE DETECTED (Cut Off Level 1000 ng/mL) Final    POC Morphine 09/21/2021 None Detected  NONE DETECTED (Cut Off Level 300 ng/mL) Final   POC Methadone UR 09/21/2021 None Detected  NONE DETECTED (Cut Off Level 300 ng/mL) Final   POC Oxycodone UR 09/21/2021 None Detected  NONE DETECTED (Cut Off Level 100 ng/mL) Final   POC Marijuana UR 09/21/2021 Positive (A)  NONE DETECTED (Cut Off Level 50 ng/mL) Final   SARS Coronavirus 2 Ag 09/21/2021 Negative  Negative Final   SARSCOV2ONAVIRUS 2 AG 09/21/2021 NEGATIVE  NEGATIVE Final   Comment: (NOTE) SARS-CoV-2 antigen NOT DETECTED.   Negative results are presumptive.  Negative results do not preclude SARS-CoV-2 infection and should not be used as the sole basis for treatment or other patient management decisions, including infection  control decisions, particularly in the presence of clinical signs and  symptoms consistent with COVID-19, or in those who have been in contact with the virus.  Negative results must be combined with clinical observations, patient history, and epidemiological information. The expected result is Negative.  Fact Sheet for Patients: https://www.jennings-kim.com/  Fact Sheet for Healthcare Providers: https://alexander-rogers.biz/  This test is not yet approved or cleared by the [place] FDA and  has been authorized for detection and/or diagnosis of SARS-CoV-2 by FDA under an Emergency Use  Authorization (EUA).  This EUA will remain in effect (meaning this test can be used) for the duration of  the COV                          ID-19 declaration under Section 564(b)(1) of the Act, 21 U.S.C. section 360bbb-3(b)(1), unless the authorization is terminated or revoked sooner.      Allergies: Amlodipine  PTA Medications: (Not in a hospital admission)   Long Term Goals: Improvement in symptoms so as ready for discharge  Short Term Goals: Patient will verbalize feelings in meetings with treatment team members., Patient will attend at least  of 50% of the groups daily., Pt will complete the PHQ9 on admission, day 3 and discharge., Patient will participate in completing the [place] Suicide Severity Rating Scale, Patient will score a low risk of violence for 24 hours prior to discharge, and Patient will take medications as prescribed daily.  Medical Decision Making  Patient presents requesting alcohol detox. He meets criteria for treatment in the [facility].     Recommendations  Based on my evaluation the patient does not appear to have an emergency medical condition.  Admit patient to [facility] for alcohol detox.   ETOH use- CIWA protocol initiated with ativan taper. Not a candidate for Librium due to AST level 65  ALT level 66.   Elevated BP- Valsartan 160 QD and hydrochlorothiazide 12.5 mg QD  MDD- Wellbutrin not restarted due to possibility of lowering seizure threshold.  Lab Orders         Resp Panel by RT-PCR (Flu A&B, Covid) Anterior Nasal Swab         CBC with Differential/Platelet         Comprehensive metabolic panel         Hemoglobin A1c         Magnesium         Ethanol         Lipid panel         TSH         Urinalysis, Routine w reflex microscopic Urine, Clean Catch         POCT Urine Drug Screen - (I-Screen)         POC SARS Coronavirus 2 Ag-ED - Nasal Swab         POC SARS Coronavirus 2 Ag      EKG  Ardis Hughs, NP 09/21/21  11:23 AM

## 2021-09-21 NOTE — BH Assessment (Signed)
Omar Ward, Urgent; 51 year old presents voluntarily to [facility] and unaccompanied.  Pt reports drinking 1/2 gallon of liquor (Vodka) every other day.  Pt denies SI, HI or AVH.  Pt presents shaken and tremors.  Pt admits to prior MH diagnosis or prescribed medication for symptom management.  MSE signed by patient.

## 2021-09-21 NOTE — ED Notes (Signed)
EKG was not done on pt due to severe shaking, pt was medicated and we will try later.

## 2021-09-22 ENCOUNTER — Encounter (HOSPITAL_COMMUNITY): Payer: Self-pay

## 2021-09-22 MED ORDER — MAGNESIUM OXIDE -MG SUPPLEMENT 400 (240 MG) MG PO TABS
400.0000 mg | ORAL_TABLET | Freq: Every day | ORAL | Status: DC
  Administered 2021-09-22 – 2021-09-24 (×3): 400 mg via ORAL
  Filled 2021-09-22 (×3): qty 1

## 2021-09-22 MED ORDER — IRBESARTAN 75 MG PO TABS
300.0000 mg | ORAL_TABLET | Freq: Every day | ORAL | Status: DC
  Administered 2021-09-22 – 2021-09-24 (×3): 300 mg via ORAL
  Filled 2021-09-22 (×3): qty 4

## 2021-09-22 MED ORDER — HYDROCHLOROTHIAZIDE 25 MG PO TABS
25.0000 mg | ORAL_TABLET | Freq: Every day | ORAL | Status: DC
  Administered 2021-09-22 – 2021-09-24 (×3): 25 mg via ORAL
  Filled 2021-09-22 (×3): qty 1

## 2021-09-22 NOTE — ED Notes (Signed)
Pt sleeping@this time. Breathing even and unlabored. Will continue to monitor for safety 

## 2021-09-22 NOTE — ED Notes (Signed)
Pt was given breakfast  ?

## 2021-09-22 NOTE — ED Notes (Signed)
Pt reports nightmares while sleeping.  Notified physician and removed nicotine patch at this time.

## 2021-09-22 NOTE — ED Notes (Signed)
Pt attended group today. 

## 2021-09-22 NOTE — ED Notes (Signed)
Pt is in the bed sleeping. Respirations are even and unlabored. No acute distress noted. Will continue to monitor for safety. 

## 2021-09-22 NOTE — Progress Notes (Signed)
Omar Ward remained visible in the milieu throughout the day except for a short nap and reported having nightmares. He attended the grouop therapy session in the courtyard titled " Building New Habits."

## 2021-09-22 NOTE — ED Provider Notes (Cosign Needed Addendum)
Behavioral Health Progress Note  Date and Time: 09/22/2021 9:01 AM Name: Omar Ward MRN:  [id number]  Subjective:  Omar Ward 51 y.o., male patient presented to [facility]  on 09/21/2021 requesting alcohol detox. He was admitted to the [facility] for crisis stabilization.    Omar Ward, 51 y.o., male patient seen face to face by this provider, consulted with Dr. Lucianne Muss; and chart reviewed on 09/22/21.    Upon admission. UDS  + for THC, ETOH is negative.   During evaluation AXIEL FJELD is observed sitting in the day room conversing with another patient. He is pleasant upon approach. He is alert/oriented, calm, cooperative and attentive.He has normal speech, and behavior. He is denying depression at this time states, "I feel better than I have in along time". He has a euthymic affect. States he slept well last night and denies any concerns with appetite. He continues to deny SI/HI/AVH. Objectively there is no evidence of psychosis/mania or delusional thinking.  Patient is able to converse coherently, goal directed thoughts, no distractibility, or pre-occupation.    He continues to tolerate the ativan taper with no adverse reactions. Taper is scheduled to end on 09/24/2021. He denies any withdrawal symptoms. He does have a noticeable fine tremor.  Discussed naltrexone for AUD and he is interested. He was given patient education on medication. If patient is agreeable Naltrexone could be started tomorrow if his blood pressure is stabilized.  Discussed increasing his current blood pressure medications to control consistent elevated blood pressure readings.   Discussed CD IOP program upon admission and patient is amendable. He is not interested in residential treatment due to working full time.   Diagnosis:  Final diagnoses:  Alcoholism (HCC)    Total Time spent with patient: 30 minutes  Past Psychiatric History: see h&p Past Medical History:  Past Medical History:  Diagnosis Date    Dyslipidemia    Hernia, abdominal    Hypertension    PUD (peptic ulcer disease)    Sleep apnea    CPAP    Past Surgical History:  Procedure Laterality Date   Knee arthrocopy      Family History:  Family History  Problem Relation Age of Onset   Diabetes Mother    COPD Mother    Hypertension Father    Diabetes Father    Heart disease Father        Pacemaker   CAD Sister 6       Stent   Family Psychiatric  History: See h&p Social History:  Social History   Substance and Sexual Activity  Alcohol Use Yes   Alcohol/week: 3.0 - 6.0 standard drinks of alcohol   Types: 3 - 6 Cans of beer per week     Social History   Substance and Sexual Activity  Drug Use No    Social History   Socioeconomic History   Marital status: Married    Spouse name: Not on file   Number of children: Not on file   Years of education: Not on file   Highest education level: Not on file  Occupational History   Not on file  Tobacco Use   Smoking status: Every Day    Types: Cigarettes   Smokeless tobacco: Never   Tobacco comments:    1/2 ppd   Substance and Sexual Activity   Alcohol use: Yes    Alcohol/week: 3.0 - 6.0 standard drinks of alcohol    Types: 3 - 6 Cans of beer  per week   Drug use: No   Sexual activity: Not Currently  Other Topics Concern   Not on file  Social History Narrative   Lives with his second wife.  Has farm animals and likes to work on cars. Likes to fish and hunt.  No regular exercise.   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 [employer]    Social Determinants of Health   Financial Resource Strain: Not on file  Food Insecurity: Not on file  Transportation Needs: Not on file  Physical Activity: Not on file  Stress: Not on file  Social Connections: Not on file   SDOH:  SDOH Screenings   Alcohol Screen: Not on file  Depression (PHQ2-9): Medium Risk (09/21/2021)   Depression (PHQ2-9)    PHQ-2 Score: 7  Financial Resource  Strain: Not on file  Food Insecurity: Not on file  Housing: Not on file  Physical Activity: Not on file  Social Connections: Not on file  Stress: Not on file  Tobacco Use: High Risk (08/16/2021)   Patient History    Smoking Tobacco Use: Every Day    Smokeless Tobacco Use: Never    Passive Exposure: Not on file  Transportation Needs: Not on file   Additional Social History:    Pain Medications: See MRA Prescriptions: See MRA Over the Counter: See MRA History of alcohol / drug use?: Yes Longest period of sobriety (when/how long): 2 years Negative Consequences of Use: Personal relationships Withdrawal Symptoms: Sweats, Tremors Name of Substance 1: Alcohol 1 - Age of First Use: 14 1 - Amount (size/oz): 1/2 gallon liqour (Vodka) 1 - Frequency: every other day 1 - Duration: ongoing 1 - Last Use / Amount: 24 hrs 1 - Method of Aquiring: UTA 1- Route of Use: Drinking                  Sleep: Good  Appetite:  Good  Current Medications:  Current Facility-Administered Medications  Medication Dose Route Frequency Provider Last Rate Last Admin   acetaminophen (TYLENOL) tablet 650 mg  650 mg Oral Q6H PRN Ardis Hughs, NP       albuterol (VENTOLIN HFA) 108 (90 Base) MCG/ACT inhaler 2 puff  2 puff Inhalation Q6H PRN Ardis Hughs, NP       alum & mag hydroxide-simeth (MAALOX/MYLANTA) 200-200-20 MG/5ML suspension 30 mL  30 mL Oral Q4H PRN Ardis Hughs, NP   30 mL at 09/21/21 1350   hydrochlorothiazide (HYDRODIURIL) tablet 25 mg  25 mg Oral Daily Ardis Hughs, NP       hydrOXYzine (ATARAX) tablet 25 mg  25 mg Oral Q6H PRN Ardis Hughs, NP       irbesartan (AVAPRO) tablet 300 mg  300 mg Oral Daily Vernard Gambles H, NP       loperamide (IMODIUM) capsule 2-4 mg  2-4 mg Oral PRN Ardis Hughs, NP       LORazepam (ATIVAN) tablet 1 mg  1 mg Oral Q6H PRN Ardis Hughs, NP       LORazepam (ATIVAN) tablet 1 mg  1 mg Oral TID Ardis Hughs, NP        Followed by   Melene Muller ON 09/23/2021] LORazepam (ATIVAN) tablet 1 mg  1 mg Oral BID Ardis Hughs, NP       Followed by   Melene Muller ON 09/24/2021] LORazepam (ATIVAN) tablet 1 mg  1 mg Oral Daily Ardis Hughs,  NP       magnesium hydroxide (MILK OF MAGNESIA) suspension 30 mL  30 mL Oral Daily PRN Ardis Hughs, NP       magnesium oxide (MAG-OX) tablet 400 mg  400 mg Oral Daily Ardis Hughs, NP       multivitamin with minerals tablet 1 tablet  1 tablet Oral Daily Ardis Hughs, NP   1 tablet at 09/21/21 6962   nicotine (NICODERM CQ - dosed in mg/24 hours) patch 21 mg  21 mg Transdermal Daily Ardis Hughs, NP   21 mg at 09/21/21 1625   ondansetron (ZOFRAN-ODT) disintegrating tablet 4 mg  4 mg Oral Q6H PRN Ardis Hughs, NP       pantoprazole (PROTONIX) EC tablet 40 mg  40 mg Oral Daily Ardis Hughs, NP   40 mg at 09/21/21 1624   rosuvastatin (CRESTOR) tablet 10 mg  10 mg Oral Daily Ardis Hughs, NP   10 mg at 09/21/21 1624   thiamine tablet 100 mg  100 mg Oral Daily Ardis Hughs, NP       Current Outpatient Medications  Medication Sig Dispense Refill   albuterol (VENTOLIN HFA) 108 (90 Base) MCG/ACT inhaler Inhale 2 puffs into the lungs every 6 (six) hours as needed for wheezing 6.7 g 4   aspirin EC 81 MG tablet Take 81 mg by mouth daily. Swallow whole.     buPROPion (WELLBUTRIN XL) 300 MG 24 hr tablet Take 1 tablet (300 mg total) by mouth daily. 30 tablet 2   Cyanocobalamin (VITAMIN B-12 PO) Take 1 tablet by mouth daily.     cyclobenzaprine (FLEXERIL) 10 MG tablet Take 1 tablet (10 mg total) by mouth at bedtime. (Patient taking differently: Take 10 mg by mouth at bedtime as needed for muscle spasms.) 10 tablet 0   doxepin (SINEQUAN) 10 MG capsule Take 2 capsules (20 mg total) by mouth at bedtime. 30 capsule 1   esomeprazole (NEXIUM) 20 MG capsule Take 20 mg by mouth daily as needed (For heartburn or acid reflux).     fluticasone (FLONASE) 50  MCG/ACT nasal spray Place 2 sprays into both nostrils daily. (Patient taking differently: Place 2 sprays into both nostrils daily as needed for allergies.) 16 g 6   hydrochlorothiazide (MICROZIDE) 12.5 MG capsule Take 1 capsule (12.5 mg total) by mouth in the morning. 30 capsule 5   ibuprofen (ADVIL) 200 MG tablet Take 400 mg by mouth every 6 (six) hours as needed for headache.     MAGNESIUM PO Take 1 capsule by mouth daily.     Multiple Vitamin (MULTIVITAMIN WITH MINERALS) TABS tablet Take 1 tablet by mouth daily.     nitroGLYCERIN (NITROSTAT) 0.4 MG SL tablet PLACE 1 TABLET UNDER THE TONGUE EVERY 5 MINUTES AS NEEDED FOR CHEST PAIN. (Patient taking differently: 0.4 mg every 5 (five) minutes as needed for chest pain.) 25 tablet 0   rosuvastatin (CRESTOR) 10 MG tablet Take 1 tablet (10 mg total) by mouth daily. 30 tablet 5   traZODone (DESYREL) 50 MG tablet TAKE 0.5-1 TABLETS (25-50 MG TOTAL) BY MOUTH AT BEDTIME AS NEEDED FOR SLEEP. (Patient taking differently: Take 50 mg by mouth at bedtime.) 30 tablet 0   valsartan (DIOVAN) 160 MG tablet Take 1 tablet (160 mg total) by mouth daily. 30 tablet 5    Labs  Lab Results:  Admission on 09/21/2021  Component Date Value Ref Range Status   SARS Coronavirus 2 by RT PCR  09/21/2021 NEGATIVE  NEGATIVE Final   Comment: (NOTE) SARS-CoV-2 target nucleic acids are NOT DETECTED.  The SARS-CoV-2 RNA is generally detectable in upper respiratory specimens during the acute phase of infection. The lowest concentration of SARS-CoV-2 viral copies this assay can detect is 138 copies/mL. A negative result does not preclude SARS-Cov-2 infection and should not be used as the sole basis for treatment or other patient management decisions. A negative result may occur with  improper specimen collection/handling, submission of specimen other than nasopharyngeal swab, presence of viral mutation(s) within the areas targeted by this assay, and inadequate number of  viral copies(<138 copies/mL). A negative result must be combined with clinical observations, patient history, and epidemiological information. The expected result is Negative.  Fact Sheet for Patients:  BloggerCourse.comhttps://www.fda.gov/media/152166/download  Fact Sheet for Healthcare Providers:  SeriousBroker.ithttps://www.fda.gov/media/152162/download  This test is no                          t yet approved or cleared by the [place] FDA and  has been authorized for detection and/or diagnosis of SARS-CoV-2 by FDA under an Emergency Use Authorization (EUA). This EUA will remain  in effect (meaning this test can be used) for the duration of the COVID-19 declaration under Section 564(b)(1) of the Act, 21 U.S.C.section 360bbb-3(b)(1), unless the authorization is terminated  or revoked sooner.       Influenza A by PCR 09/21/2021 NEGATIVE  NEGATIVE Final   Influenza B by PCR 09/21/2021 NEGATIVE  NEGATIVE Final   Comment: (NOTE) The Xpert Xpress SARS-CoV-2/FLU/RSV plus assay is intended as an aid in the diagnosis of influenza from Nasopharyngeal swab specimens and should not be used as a sole basis for treatment. Nasal washings and aspirates are unacceptable for Xpert Xpress SARS-CoV-2/FLU/RSV testing.  Fact Sheet for Patients: BloggerCourse.comhttps://www.fda.gov/media/152166/download  Fact Sheet for Healthcare Providers: SeriousBroker.ithttps://www.fda.gov/media/152162/download  This test is not yet approved or cleared by the [place] FDA and has been authorized for detection and/or diagnosis of SARS-CoV-2 by FDA under an Emergency Use Authorization (EUA). This EUA will remain in effect (meaning this test can be used) for the duration of the COVID-19 declaration under Section 564(b)(1) of the Act, 21 U.S.C. section 360bbb-3(b)(1), unless the authorization is terminated or revoked.  Performed at [facility], [address] [place], [place] [id number]    WBC 09/21/2021 8.1  4.0 - 10.5 K/uL Final   RBC 09/21/2021 4.34   4.22 - 5.81 MIL/uL Final   Hemoglobin 09/21/2021 15.0  13.0 - 17.0 g/dL Final   HCT 47/82/956207/25/2023 43.5  39.0 - 52.0 % Final   MCV 09/21/2021 100.2 (H)  80.0 - 100.0 fL Final   MCH 09/21/2021 34.6 (H)  26.0 - 34.0 pg Final   MCHC 09/21/2021 34.5  30.0 - 36.0 g/dL Final   RDW 13/08/657807/25/2023 14.2  11.5 - 15.5 % Final   Platelets 09/21/2021 129 (L)  150 - 400 K/uL Final   nRBC 09/21/2021 0.0  0.0 - 0.2 % Final   Neutrophils Relative % 09/21/2021 75  % Final   Neutro Abs 09/21/2021 6.1  1.7 - 7.7 K/uL Final   Lymphocytes Relative 09/21/2021 16  % Final   Lymphs Abs 09/21/2021 1.3  0.7 - 4.0 K/uL Final   Monocytes Relative 09/21/2021 8  % Final   Monocytes Absolute 09/21/2021 0.7  0.1 - 1.0 K/uL Final   Eosinophils Relative 09/21/2021 0  % Final   Eosinophils Absolute 09/21/2021 0.0  0.0 - 0.5 K/uL Final   Basophils Relative 09/21/2021 0  % Final   Basophils Absolute 09/21/2021 0.0  0.0 - 0.1 K/uL Final   Immature Granulocytes 09/21/2021 1  % Final   Abs Immature Granulocytes 09/21/2021 0.04  0.00 - 0.07 K/uL Final   Performed at [facility], [address] [place], [place] [address]   Sodium 09/21/2021 138  135 - 145 mmol/L Final   Potassium 09/21/2021 3.6  3.5 - 5.1 mmol/L Final   Chloride 09/21/2021 97 (L)  98 - 111 mmol/L Final   CO2 09/21/2021 24  22 - 32 mmol/L Final   Glucose, Bld 09/21/2021 114 (H)  70 - 99 mg/dL Final   Glucose reference range applies only to samples taken after fasting for at least 8 hours.   BUN 09/21/2021 5 (L)  6 - 20 mg/dL Final   Creatinine, Ser 09/21/2021 1.01  0.61 - 1.24 mg/dL Final   Calcium 13/09/6576 8.9  8.9 - 10.3 mg/dL Final   Total Protein 46/96/2952 7.4  6.5 - 8.1 g/dL Final   Albumin 84/13/2440 4.4  3.5 - 5.0 g/dL Final   AST 12/25/2534 65 (H)  15 - 41 U/L Final   ALT 09/21/2021 66 (H)  0 - 44 U/L Final   Alkaline Phosphatase 09/21/2021 66  38 - 126 U/L Final   Total Bilirubin 09/21/2021 1.0  0.3 - 1.2 mg/dL Final   GFR, Estimated 09/21/2021  >60  >60 mL/min Final   Comment: (NOTE) Calculated using the CKD-EPI Creatinine Equation (2021)    Anion gap 09/21/2021 17 (H)  5 - 15 Final   Performed at [facility], [address] [place], [place] [address]   Hgb A1c MFr Bld 09/21/2021 5.6  4.8 - 5.6 % Final   Comment: (NOTE) Pre diabetes:          5.7%-6.4%  Diabetes:              >6.4%  Glycemic control for   <7.0% adults with diabetes    Mean Plasma Glucose 09/21/2021 114.02  mg/dL Final   Performed at [facility], [address], [place], [place] [address]   Magnesium 09/21/2021 1.4 (L)  1.7 - 2.4 mg/dL Final   Performed at [facility], [address] [place], [place] [address]   Alcohol, Ethyl (B) 09/21/2021 <10  <10 mg/dL Final   Comment: (NOTE) Lowest detectable limit for serum alcohol is 10 mg/dL.  For medical purposes only. Performed at [facility], [address], [place], [place] [address]    Cholesterol 09/21/2021 276 (H)  0 - 200 mg/dL Final   Triglycerides 32/95/1884 109  <150 mg/dL Final   HDL 16/60/6301 81  >40 mg/dL Final   Total CHOL/HDL Ratio 09/21/2021 3.4  RATIO Final   VLDL 09/21/2021 22  0 - 40 mg/dL Final   LDL Cholesterol 09/21/2021 173 (H)  0 - 99 mg/dL Final   Comment:        Total Cholesterol/HDL:CHD Risk Coronary Heart Disease Risk Table                     Men   Women  1/2 Average Risk   3.4   3.3  Average Risk       5.0   4.4  2 X Average Risk   9.6   7.1  3 X Average Risk  23.4   11.0  Use the calculated Patient Ratio above and the CHD Risk Table to determine the patient's CHD Risk.        ATP III CLASSIFICATION (LDL):  <100     mg/dL   Optimal  711-657  mg/dL   Near or Above                    Optimal  130-159  mg/dL   Borderline  903-833  mg/dL   High  >383     mg/dL   Very High Performed at [facility], [address] [place], [place] [address]    TSH 09/21/2021 3.018  0.350 - 4.500 uIU/mL Final   Comment: Performed by a 3rd  Generation assay with a functional sensitivity of <=0.01 uIU/mL. Performed at [facility], [address] [place], [place] [address]    Color, Urine 09/21/2021 YELLOW  YELLOW Final   APPearance 09/21/2021 CLEAR  CLEAR Final   Specific Gravity, Urine 09/21/2021 1.016  1.005 - 1.030 Final   pH 09/21/2021 9.0 (H)  5.0 - 8.0 Final   Glucose, UA 09/21/2021 NEGATIVE  NEGATIVE mg/dL Final   Hgb urine dipstick 09/21/2021 NEGATIVE  NEGATIVE Final   Bilirubin Urine 09/21/2021 NEGATIVE  NEGATIVE Final   Ketones, ur 09/21/2021 5 (A)  NEGATIVE mg/dL Final   Protein, ur 04/59/9774 30 (A)  NEGATIVE mg/dL Final   Nitrite 14/23/9532 NEGATIVE  NEGATIVE Final   Leukocytes,Ua 09/21/2021 NEGATIVE  NEGATIVE Final   RBC / HPF 09/21/2021 0-5  0 - 5 RBC/hpf Final   WBC, UA 09/21/2021 0-5  0 - 5 WBC/hpf Final   Bacteria, UA 09/21/2021 NONE SEEN  NONE SEEN Final   Performed at [facility], [address], [place], [place] [address]   POC Amphetamine UR 09/21/2021 None Detected  NONE DETECTED (Cut Off Level 1000 ng/mL) Final   POC Secobarbital (BAR) 09/21/2021 None Detected  NONE DETECTED (Cut Off Level 300 ng/mL) Final   POC Buprenorphine (BUP) 09/21/2021 None Detected  NONE DETECTED (Cut Off Level 10 ng/mL) Final   POC Oxazepam (BZO) 09/21/2021 None Detected  NONE DETECTED (Cut Off Level 300 ng/mL) Final   POC Cocaine UR 09/21/2021 None Detected  NONE DETECTED (Cut Off Level 300 ng/mL) Final   POC Methamphetamine UR 09/21/2021 None Detected  NONE DETECTED (Cut Off Level 1000 ng/mL) Final   POC Morphine 09/21/2021 None Detected  NONE DETECTED (Cut Off Level 300 ng/mL) Final   POC Methadone UR 09/21/2021 None Detected  NONE DETECTED (Cut Off Level 300 ng/mL) Final   POC Oxycodone UR 09/21/2021 None Detected  NONE DETECTED (Cut Off Level 100 ng/mL) Final   POC Marijuana UR 09/21/2021 Positive (A)  NONE DETECTED (Cut Off Level 50 ng/mL) Final   SARS Coronavirus 2 Ag 09/21/2021 Negative  Negative Final    SARSCOV2ONAVIRUS 2 AG 09/21/2021 NEGATIVE  NEGATIVE Final   Comment: (NOTE) SARS-CoV-2 antigen NOT DETECTED.   Negative results are presumptive.  Negative results do not preclude SARS-CoV-2 infection and should not be used as the sole basis for treatment or other patient management decisions, including infection  control decisions, particularly in the presence of clinical signs and  symptoms consistent with COVID-19, or in those who have been in contact with the virus.  Negative results must be combined with clinical observations, patient history, and epidemiological information. The expected result is Negative.  Fact Sheet for Patients: https://www.jennings-kim.com/  Fact Sheet for Healthcare Providers: https://alexander-rogers.biz/  This test is  not yet approved or cleared by the [place] and  has been authorized for detection and/or diagnosis of SARS-CoV-2 by FDA under an Emergency Use Authorization (EUA).  This EUA will remain in effect (meaning this test can be used) for the duration of  the COV                          ID-19 declaration under Section 564(b)(1) of the Act, 21 U.S.C. section 360bbb-3(b)(1), unless the authorization is terminated or revoked sooner.      Blood Alcohol level:  Lab Results  Component Value Date   ETH <10 09/21/2021    Metabolic Disorder Labs: Lab Results  Component Value Date   HGBA1C 5.6 09/21/2021   MPG 114.02 09/21/2021   No results found for: "PROLACTIN" Lab Results  Component Value Date   CHOL 276 (H) 09/21/2021   TRIG 109 09/21/2021   HDL 81 09/21/2021   CHOLHDL 3.4 09/21/2021   VLDL 22 09/21/2021   LDLCALC 173 (H) 09/21/2021   LDLCALC 150 (H) 10/31/2018    Therapeutic Lab Levels: No results found for: "LITHIUM" No results found for: "VALPROATE" No results found for: "CBMZ"  Physical Findings   PHQ2-9    Flowsheet Row ED from 09/21/2021 in [facility]  Office Visit from 04/01/2020 in [facility] Office Visit from 12/03/2019 in [facility] Office Visit from 07/02/2019 in [facility] Office Visit from 02/13/2019 in [facility]  PHQ-2 Total Score 1 0 5 2 0  PHQ-9 Total Score 7 0 16 12 --      Flowsheet Row ED from 09/21/2021 in [facility] ED from 08/16/2021 in [facility] RISK CATEGORY No Risk No Risk        Musculoskeletal  Strength & Muscle Tone: within normal limits Gait & Station: normal Patient leans: N/A  Psychiatric Specialty Exam  Presentation  General Appearance: Appropriate for Environment  Eye Contact:Good  Speech:Clear and Coherent; Normal Rate  Speech Volume:Normal  Handedness:Right   Mood and Affect  Mood:Euthymic  Affect:Congruent   Thought Process  Thought Processes:Coherent  Descriptions of Associations:Intact  Orientation:Full (Time, Place and Person)  Thought Content:Logical  Diagnosis of Schizophrenia or Schizoaffective disorder in past: No    Hallucinations:Hallucinations: None  Ideas of Reference:None  Suicidal Thoughts:Suicidal Thoughts: No  Homicidal Thoughts:Homicidal Thoughts: No   Sensorium  Memory:Immediate Good; Recent Good; Remote Good  Judgment:Good  Insight:Good   Executive Functions  Concentration:Good  Attention Span:Good  Recall:Good  Fund of Knowledge:Good  Language:Good   Psychomotor Activity  Psychomotor Activity:Psychomotor Activity: Normal   Assets  Assets:Communication Skills; Desire for Improvement; Financial Resources/Insurance; Housing; Physical Health; Resilience; Social Support   Sleep  Sleep:Sleep: Good Number of Hours of Sleep: 3   Nutritional Assessment (For OBS and FBC admissions only) Has the patient had a weight loss or gain of 10 pounds or more in the last 3 months?: No Has the  patient had a decrease in food intake/or appetite?: Yes Does the patient have dental problems?: No Does the patient have eating habits or behaviors that may be indicators of an eating disorder including binging or inducing vomiting?: No Has the patient recently lost weight without trying?: 2.0 Has the patient been eating poorly because of a decreased appetite?: 1 Malnutrition Screening Tool Score: 3    Physical Exam  Physical Exam Vitals and nursing note reviewed.  Constitutional:      General: He is not in acute distress.    Appearance: He is well-developed.  HENT:     Head: Normocephalic and atraumatic.  Eyes:     Conjunctiva/sclera: Conjunctivae normal.  Cardiovascular:     Rate and Rhythm: Normal rate.     Heart sounds: No murmur heard. Pulmonary:     Effort: Pulmonary effort is normal. No respiratory distress.  Musculoskeletal:        General: No swelling. Normal range of motion.     Cervical back: Normal range of motion.  Neurological:     Mental Status: He is alert.  Psychiatric:        Attention and Perception: Attention and perception normal.        Mood and Affect: Mood and affect normal.        Speech: Speech normal.        Behavior: Behavior normal. Behavior is cooperative.        Thought Content: Thought content normal.        Cognition and Memory: Cognition normal.        Judgment: Judgment normal.    Review of Systems  Constitutional: Negative.   HENT: Negative.    Eyes: Negative.   Respiratory: Negative.    Cardiovascular: Negative.   Genitourinary: Negative.   Musculoskeletal: Negative.   Skin: Negative.   Psychiatric/Behavioral: Negative.     Blood pressure (!) 149/115, pulse 89, temperature 98 F (36.7 C), temperature source Tympanic, resp. rate 18, SpO2 97 %. There is no height or weight on file to calculate BMI.  Treatment Plan Summary:  Disposition: Patient continues to meet criteria for treatment in [facility]. Will continue to have daily contact  with patient to assess and evaluate symptoms and progress in treatment and Medication management.  Ativan taper scheduled to end Friday 09/24/2021. Discussed Naltrexone for AUD possibly tomorrow,if BP is stabilized. Will not restart Wellbutrin at this time due to elevated BP, and the continued risk to reduce the seizure threshold.   Elevated BP- increased Avapro to 300 mg QD and HCTZ 25 mg QD.   Magnesium Oxide 400 mg QD ordered- Magnesium level is 1.4  SW notified that upon discharge patient is interested in CD IOP.   Ardis Hughs, NP 09/22/2021 9:01 AM

## 2021-09-22 NOTE — Group Note (Signed)
Group Topic: Overcoming Obstacles  Group Date: 09/22/2021 Start Time: 1045 End Time: 1115 Facilitators: Rico Sheehan, LPN  Department: [facility]  Number of Participants: 4  Group Focus: nursing group and self-awareness Treatment Modality:  Patient-Centered Therapy Interventions utilized were problem solving Purpose: enhance coping skills and increase insight  Name: Omar Ward Date of Birth: Jun 11, 1970  MR: [id number]    Level of Participation: active Quality of Participation: engaged Interactions with others: gave feedback Mood/Affect: positive Triggers (if applicable):  Cognition: goal directed Progress: Minimal Response:  Plan: patient will be encouraged to follow treatment plan established by provider and social worker  Patients Problems:  Patient Active Problem List   Diagnosis Date Noted   Alcohol use disorder 09/21/2021   Coronary atherosclerosis 04/01/2020   Vitamin D deficiency 01/09/2020   GERD (gastroesophageal reflux disease) 01/08/2020   Weight loss 01/08/2020   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

## 2021-09-22 NOTE — BH IP Treatment Plan (Addendum)
Interdisciplinary Treatment and Diagnostic Plan Update  09/22/2021 Time of Session: 10:20AM  Omar Ward MRN: [id number]  Diagnosis:  Final diagnoses:  Alcoholism (HCC)     Current Medications:  Current Facility-Administered Medications  Medication Dose Route Frequency Provider Last Rate Last Admin   acetaminophen (TYLENOL) tablet 650 mg  650 mg Oral Q6H PRN Ardis Hughs, NP       albuterol (VENTOLIN HFA) 108 (90 Base) MCG/ACT inhaler 2 puff  2 puff Inhalation Q6H PRN Ardis Hughs, NP       alum & mag hydroxide-simeth (MAALOX/MYLANTA) 200-200-20 MG/5ML suspension 30 mL  30 mL Oral Q4H PRN Ardis Hughs, NP   30 mL at 09/21/21 1350   hydrochlorothiazide (HYDRODIURIL) tablet 25 mg  25 mg Oral Daily Ardis Hughs, NP       hydrOXYzine (ATARAX) tablet 25 mg  25 mg Oral Q6H PRN Ardis Hughs, NP       irbesartan (AVAPRO) tablet 300 mg  300 mg Oral Daily Ardis Hughs, NP       loperamide (IMODIUM) capsule 2-4 mg  2-4 mg Oral PRN Ardis Hughs, NP       LORazepam (ATIVAN) tablet 1 mg  1 mg Oral Q6H PRN Ardis Hughs, NP       LORazepam (ATIVAN) tablet 1 mg  1 mg Oral TID Ardis Hughs, NP       Followed by   Melene Muller ON 09/23/2021] LORazepam (ATIVAN) tablet 1 mg  1 mg Oral BID Ardis Hughs, NP       Followed by   Melene Muller ON 09/24/2021] LORazepam (ATIVAN) tablet 1 mg  1 mg Oral Daily Ardis Hughs, NP       magnesium hydroxide (MILK OF MAGNESIA) suspension 30 mL  30 mL Oral Daily PRN Ardis Hughs, NP       magnesium oxide (MAG-OX) tablet 400 mg  400 mg Oral Daily Ardis Hughs, NP       multivitamin with minerals tablet 1 tablet  1 tablet Oral Daily Ardis Hughs, NP   1 tablet at 09/21/21 2725   nicotine (NICODERM CQ - dosed in mg/24 hours) patch 21 mg  21 mg Transdermal Daily Ardis Hughs, NP   21 mg at 09/21/21 1625   ondansetron (ZOFRAN-ODT) disintegrating tablet 4 mg  4 mg Oral Q6H PRN Ardis Hughs, NP        pantoprazole (PROTONIX) EC tablet 40 mg  40 mg Oral Daily Ardis Hughs, NP   40 mg at 09/21/21 1624   rosuvastatin (CRESTOR) tablet 10 mg  10 mg Oral Daily Ardis Hughs, NP   10 mg at 09/21/21 1624   thiamine tablet 100 mg  100 mg Oral Daily Ardis Hughs, NP       Current Outpatient Medications  Medication Sig Dispense Refill   albuterol (VENTOLIN HFA) 108 (90 Base) MCG/ACT inhaler Inhale 2 puffs into the lungs every 6 (six) hours as needed for wheezing 6.7 g 4   aspirin EC 81 MG tablet Take 81 mg by mouth daily. Swallow whole.     buPROPion (WELLBUTRIN XL) 300 MG 24 hr tablet Take 1 tablet (300 mg total) by mouth daily. 30 tablet 2   Cyanocobalamin (VITAMIN B-12 PO) Take 1 tablet by mouth daily.     cyclobenzaprine (FLEXERIL) 10 MG tablet Take 1 tablet (10 mg total) by mouth at bedtime. (Patient taking differently: Take 10 mg by mouth  at bedtime as needed for muscle spasms.) 10 tablet 0   doxepin (SINEQUAN) 10 MG capsule Take 2 capsules (20 mg total) by mouth at bedtime. 30 capsule 1   esomeprazole (NEXIUM) 20 MG capsule Take 20 mg by mouth daily as needed (For heartburn or acid reflux).     fluticasone (FLONASE) 50 MCG/ACT nasal spray Place 2 sprays into both nostrils daily. (Patient taking differently: Place 2 sprays into both nostrils daily as needed for allergies.) 16 g 6   hydrochlorothiazide (MICROZIDE) 12.5 MG capsule Take 1 capsule (12.5 mg total) by mouth in the morning. 30 capsule 5   ibuprofen (ADVIL) 200 MG tablet Take 400 mg by mouth every 6 (six) hours as needed for headache.     MAGNESIUM PO Take 1 capsule by mouth daily.     Multiple Vitamin (MULTIVITAMIN WITH MINERALS) TABS tablet Take 1 tablet by mouth daily.     nitroGLYCERIN (NITROSTAT) 0.4 MG SL tablet PLACE 1 TABLET UNDER THE TONGUE EVERY 5 MINUTES AS NEEDED FOR CHEST PAIN. (Patient taking differently: 0.4 mg every 5 (five) minutes as needed for chest pain.) 25 tablet 0   rosuvastatin (CRESTOR) 10 MG  tablet Take 1 tablet (10 mg total) by mouth daily. 30 tablet 5   traZODone (DESYREL) 50 MG tablet TAKE 0.5-1 TABLETS (25-50 MG TOTAL) BY MOUTH AT BEDTIME AS NEEDED FOR SLEEP. (Patient taking differently: Take 50 mg by mouth at bedtime.) 30 tablet 0   valsartan (DIOVAN) 160 MG tablet Take 1 tablet (160 mg total) by mouth daily. 30 tablet 5   PTA Medications: Prior to Admission medications   Medication Sig Start Date End Date Taking? Authorizing Provider  albuterol (VENTOLIN HFA) 108 (90 Base) MCG/ACT inhaler Inhale 2 puffs into the lungs every 6 (six) hours as needed for wheezing 01/19/21  Yes   aspirin EC 81 MG tablet Take 81 mg by mouth daily. Swallow whole.   Yes [provider]  buPROPion (WELLBUTRIN XL) 300 MG 24 hr tablet Take 1 tablet (300 mg total) by mouth daily. 08/03/21  Yes   Cyanocobalamin (VITAMIN B-12 PO) Take 1 tablet by mouth daily.   Yes [provider]  cyclobenzaprine (FLEXERIL) 10 MG tablet Take 1 tablet (10 mg total) by mouth at bedtime. Patient taking differently: Take 10 mg by mouth at bedtime as needed for muscle spasms. 08/16/21  Yes White, Adrienne R, NP  doxepin (SINEQUAN) 10 MG capsule Take 2 capsules (20 mg total) by mouth at bedtime. 06/15/21  Yes   esomeprazole (NEXIUM) 20 MG capsule Take 20 mg by mouth daily as needed (For heartburn or acid reflux). 06/15/21  Yes [provider]  fluticasone (FLONASE) 50 MCG/ACT nasal spray Place 2 sprays into both nostrils daily. Patient taking differently: Place 2 sprays into both nostrils daily as needed for allergies. 07/02/19  Yes Chambliss, Estill Batten, MD  hydrochlorothiazide (MICROZIDE) 12.5 MG capsule Take 1 capsule (12.5 mg total) by mouth in the morning. 02/17/21  Yes   ibuprofen (ADVIL) 200 MG tablet Take 400 mg by mouth every 6 (six) hours as needed for headache.   Yes [provider]  MAGNESIUM PO Take 1 capsule by mouth daily.   Yes [provider]  Multiple Vitamin (MULTIVITAMIN  WITH MINERALS) TABS tablet Take 1 tablet by mouth daily.   Yes [provider]  nitroGLYCERIN (NITROSTAT) 0.4 MG SL tablet PLACE 1 TABLET UNDER THE TONGUE EVERY 5 MINUTES AS NEEDED FOR CHEST PAIN. Patient taking differently: 0.4 mg every  5 (five) minutes as needed for chest pain. 12/02/19 09/21/21 Yes Coen, Crystal, FNP  rosuvastatin (CRESTOR) 10 MG tablet Take 1 tablet (10 mg total) by mouth daily. 02/08/21  Yes   traZODone (DESYREL) 50 MG tablet TAKE 0.5-1 TABLETS (25-50 MG TOTAL) BY MOUTH AT BEDTIME AS NEEDED FOR SLEEP. Patient taking differently: Take 50 mg by mouth at bedtime. 06/15/21 06/15/22 Yes Plotnikov, Evie Lacks, MD  valsartan (DIOVAN) 160 MG tablet Take 1 tablet (160 mg total) by mouth daily. 02/17/21  Yes     Patient Stressors: Marital or family conflict   Substance abuse    Patient Strengths: Motivation for treatment/growth   Treatment Modalities: Medication Management, Group therapy, Case management,  1 to 1 session with clinician, Psychoeducation, Recreational therapy.   Physician Treatment Plan for Primary and Secondary Diagnosis:  Final diagnoses:  Alcoholism ([place])   Long Term Goal(s): Improvement in symptoms so as ready for discharge  Short Term Goals: Patient will verbalize feelings in meetings with treatment team members. Patient will attend at least of 50% of the groups daily. Pt will complete the PHQ9 on admission, day 3 and discharge. Patient will participate in completing the [place] Patient will score a low risk of violence for 24 hours prior to discharge Patient will take medications as prescribed daily.  Medication Management: Evaluate patient's response, side effects, and tolerance of medication regimen.  Therapeutic Interventions: 1 to 1 sessions, Unit Group sessions and Medication administration.  Evaluation of Outcomes: Adequate for Discharge  LCSW Treatment Plan for Primary Diagnosis:  Final diagnoses:   Alcoholism ([place])    Long Term Goal(s): Safe transition to appropriate next level of care at discharge.  Short Term Goals: Facilitate acceptance of mental health diagnosis and concerns through verbal commitment to aftercare plan and appointments at discharge. and Identify minimum of 2 triggers associated with mental health/substance abuse issues with treatment team members.  Therapeutic Interventions: Assess for all discharge needs, 1 to 1 time with Education officer, museum, Explore available resources and support systems, Assess for adequacy in community support network, Educate family and significant other(s) on suicide prevention, Complete Psychosocial Assessment, Interpersonal group therapy.  Evaluation of Outcomes: Adequate for Discharge   Progress in Treatment: Attending groups: Yes. Participating in groups: Yes. Taking medication as prescribed: Yes. Toleration medication: Yes. Family/Significant other contact made: No, will contact:  no one at this time  Patient understands diagnosis: Yes. Discussing patient identified problems/goals with staff: Yes. Medical problems stabilized or resolved: Yes. Denies suicidal/homicidal ideation: Yes. Issues/concerns per patient self-inventory: No. Other: None   New problem(s) identified: No, Describe:  None   New Short Term/Long Term Goal(s): Jeydan reports his long-term goal is to reconcile his marriage, which became strained due to his alcohol use over the years.   Patient Goals:  "To get out of here and not to touch alcohol. I need to get back to work"  Discharge Plan or Barriers: Maclan plans to return home, in [place] with outpatient substance abuse counseling services.   Reason for Continuation of Hospitalization: None  Estimated Length of Stay: Discharge, Friday, 09/24/21  Last 3 [place] Suicide Severity Risk Score: [facility] from 09/21/2021 in [facility] ED from 08/16/2021 in [facility] at  [place] No Risk No Risk       Last PHQ 2/9 Scores:    09/21/2021   11:34 AM 04/01/2020    8:14 AM 12/03/2019   10:58 AM  Depression screen PHQ 2/9  Decreased Interest 0 0 3  Down, Depressed, Hopeless 1 0 2  PHQ - 2 Score 1 0 5  Altered sleeping 3 0 3  Tired, decreased energy 1 0 3  Change in appetite 1 0 2  Feeling bad or failure about yourself  1 0 2  Trouble concentrating 0 0 1  Moving slowly or fidgety/restless 0 0 0  Suicidal thoughts 0 0 0  PHQ-9 Score 7 0 16  Difficult doing work/chores Somewhat difficult      Scribe for Treatment Team: Marylee Floras, LCSW 09/22/2021 9:12 AM

## 2021-09-22 NOTE — ED Notes (Signed)
Pt sitting in dayroom calm and cooperative. He is sitting at table with another pt and they are doing crossword puzzles. Pt has no c/o pain or distress. Will continue to monitor for safety

## 2021-09-22 NOTE — Progress Notes (Signed)
Received Omar Ward this AM in the dinning room eating his breakfast. During our one to one he was tearful related to sharing his wife is an alcoholic. He endorsed feeling anxious and worried, but denied feeling suicidal. He verbalized the AA meeting last night was helful.

## 2021-09-22 NOTE — ED Notes (Signed)
Pt was given dinner. 

## 2021-09-22 NOTE — Clinical Social Work Psych Note (Signed)
LCSW Initial Note  LCSW met with Omar Ward for introduction and to begin discussions treatment and potential discharge planning. Omar Ward shared that he came to [facility] seeking assistance for worsening depressive symptoms and alcohol use issues.   Omar Ward denied having any SI, HI or AVH at this time.   Omar Ward shared that he and his wife were currently separated due to his ongoing alcohol issues. According to Omar Ward's CCA note, " 51 year old married male who presents voluntarily to [facility] and unaccompanied.  Pt denies SI, HI, or AVH.  Pt reports he has a history of depression and has been feeling increasingly depressed due to the separation from his wife.  Pt says he has been drinking half galloon  of Vodka  every other day.  Pt says he smoke a pack of cigarettes daily.  Pt denies using any other substance use.  Pt acknowledges symptoms including, tremors, shaken, isolation,anxious, fatigue, loss of interests, feelings of worthlessness, guilt and exhausted.   Pt reports sleeping three hours during the night; also reports eating fine. Pt identifies his primary stressor with as the separation from his wife of twenty-three years marriage.  Pt reports "I live alone with my dog, who is going to take care of my dog".  Pt reports working full time. Pt gave TTS permission to contact his wife, Omar Ward, [phone], unsuccessful. Pt reports family history of mental illness.  Pt reports  family history of substance use.  Pt denies any history of abuse or trauma.  Pt denies any current legal problems.  Pt reports guns are kept in the house in a safe placed. Pt says he is not currently receiving weekly outpatient therapy; also reports receiving outpatient medication management with primary doctor.  Pt reports no previous inpatient psychiatric hospitalization. Pt reports two years of sobriety attending  [facility] groups.  Pt reports no MAT".  Omar Ward reports that he is not interested in residential substance abuse treatment. Omar Ward  expressed interest in outpatient substance abuse counseling services as an alternative. Omar Ward reports he plans to return home, alone in [place], [place].   Omar Ward denied having any additional questions or concerns at this time.   LCSW will continue to follow.   Radonna Ricker, MSW, LCSW Clinical Education officer, museum ([place]) [facility]

## 2021-09-23 ENCOUNTER — Encounter (HOSPITAL_COMMUNITY): Payer: Self-pay | Admitting: Psychiatry

## 2021-09-23 MED ORDER — HYDROCHLOROTHIAZIDE 25 MG PO TABS
25.0000 mg | ORAL_TABLET | Freq: Every day | ORAL | 0 refills | Status: DC
  Filled 2021-09-24: qty 30, 30d supply, fill #0

## 2021-09-23 MED ORDER — NICOTINE 21 MG/24HR TD PT24
21.0000 mg | MEDICATED_PATCH | Freq: Every day | TRANSDERMAL | 0 refills | Status: AC
  Filled 2021-09-24: qty 28, 28d supply, fill #0

## 2021-09-23 MED ORDER — IRBESARTAN 300 MG PO TABS
300.0000 mg | ORAL_TABLET | Freq: Every day | ORAL | 0 refills | Status: DC
  Filled 2021-09-24: qty 30, 30d supply, fill #0

## 2021-09-23 MED ORDER — ROSUVASTATIN CALCIUM 10 MG PO TABS
10.0000 mg | ORAL_TABLET | Freq: Every day | ORAL | 0 refills | Status: DC
  Filled 2021-09-24: qty 30, 30d supply, fill #0

## 2021-09-23 MED ORDER — ALBUTEROL SULFATE HFA 108 (90 BASE) MCG/ACT IN AERS
2.0000 | INHALATION_SPRAY | Freq: Four times a day (QID) | RESPIRATORY_TRACT | 0 refills | Status: DC | PRN
  Filled 2021-09-24: qty 6.7, 25d supply, fill #0

## 2021-09-23 MED ORDER — ESOMEPRAZOLE MAGNESIUM 20 MG PO CPDR
20.0000 mg | DELAYED_RELEASE_CAPSULE | Freq: Every day | ORAL | 0 refills | Status: DC | PRN
Start: 2021-09-23 — End: 2023-08-04
  Filled 2021-09-24: qty 30, 30d supply, fill #0

## 2021-09-23 NOTE — ED Notes (Signed)
Pt participated in AA group.  

## 2021-09-23 NOTE — ED Notes (Signed)
Pt sleeping@this time. Breathing even and unlabored. Will continue to monitor for safety 

## 2021-09-23 NOTE — ED Notes (Signed)
Patient A&Ox4. Denies intent to harm self/others when asked. Denies A/VH. Patient denies any physical complaints when asked. No acute distress noted. Routine safety checks conducted according to facility protocol. Encouraged patient to notify staff if thoughts of harm toward self or others arise. Patient verbalize understanding and agreement. Will continue to monitor for safety.    

## 2021-09-23 NOTE — ED Notes (Signed)
Patient is resting comfortably. 

## 2021-09-23 NOTE — Discharge Instructions (Addendum)

## 2021-09-23 NOTE — ED Provider Notes (Signed)
FBC/OBS ASAP Discharge Summary  Date and Time: 09/23/2021 3:27 PM  Name: Omar Ward  MRN:  [id number]   Discharge Diagnoses:  Final diagnoses:  Alcoholism [facility])   Subjective:   Pt assessed face to face by nurse practitioner today. Pt reports euthymic mood, verbalizes feeling ready for discharge, "ready to go back home". Discussed recommendation for completing ativan taper (scheduled to end tomorrow), which pt verbalized understanding and agreed with. Latest CIWA reviewed from this morning 0650, =0. Pt denies withdrawal sx at this time. Pt denies fever, chills, blurry vision, double vision, cp, palpitations, sob, abd pain, n/v/d, HA, dizziness, weakness, loc.  Pt denies suicidal, homicidal, or violent ideations. He denies auditory visual hallucination or paranoia.   Pt's B/P continues to be elevated, 125/107. From 09/21/21, labwork AST 65, ALT 66. Discussed holding off on Naltrexone for now. Pt states he was supposed to see his PCP on the day of [facility] admission and his separated wife Omar Ward is going to help him by calling today to reschedule his appointment. Discussed speaking to his PCP about starting Naltrexone. Pt agrees w/ plan. Plan is for discharge tomorrow w/ close PCP follow up and CDIOP program. Pt verbalizes understanding and agrees w/ plan.   Pt gave verbal consent to speak w/ his separated wife Omar Ward, [phone]. Spoke w/ Omar Ward who verbalized understanding of pt admission to [facility]. She confirms she is going to call pt's PCP today to reschedule his appointment. Discussed plan is for pt to be discharged tomorrow. Omar Ward was asked if she had any safety concerns w/ pt discharge. Omar Ward denied any safety concerns w/ pt discharge to pt or others.   Stay Summary:  Pt is a 51 y/o male admitted to [facility] on 09/21/21 for alcohol detox. On reassessment today, pt appears to be tolerating ativan taper. Pt is denying suicidal, homicidal, violent ideation. He also denies auditory visual hallucinations or  paranoia. Collateral obtained from pt's separated wife Omar Ward, who also denies safety concerns w/ pt discharge. Pt to be discharged, w/ recommendation for close follow up with his PCP and CDIOP.  Total Time spent with patient: 20 minutes  Past Psychiatric History: Hx of alcohol abuse Past Medical History:  Past Medical History:  Diagnosis Date   Dyslipidemia    Hernia, abdominal    Hypertension    PUD (peptic ulcer disease)    Sleep apnea    CPAP    Past Surgical History:  Procedure Laterality Date   Knee arthrocopy      Family History:  Family History  Problem Relation Age of Onset   Diabetes Mother    COPD Mother    Hypertension Father    Diabetes Father    Heart disease Father        Pacemaker   CAD Sister 66       Stent   Family Psychiatric History: Unknown Social History:  Social History   Substance and Sexual Activity  Alcohol Use Yes   Alcohol/week: 3.0 - 6.0 standard drinks of alcohol   Types: 3 - 6 Cans of beer per week     Social History   Substance and Sexual Activity  Drug Use No    Social History   Socioeconomic History   Marital status: Married    Spouse name: Not on file   Number of children: Not on file   Years of education: Not on file   Highest education level: Not on file  Occupational History   Not on file  Tobacco  Use   Smoking status: Every Day    Types: Cigarettes   Smokeless tobacco: Never   Tobacco comments:    1/2 ppd   Substance and Sexual Activity   Alcohol use: Yes    Alcohol/week: 3.0 - 6.0 standard drinks of alcohol    Types: 3 - 6 Cans of beer per week   Drug use: No   Sexual activity: Not Currently  Other Topics Concern   Not on file  Social History Narrative   Lives with his second wife.  Has farm animals and likes to work on cars. Likes to fish and hunt.  No regular exercise.   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 [employer] Strain: Not on file  Food Insecurity: Not on file  Transportation Needs: Not on file  Physical Activity: Not on file  Stress: Not on file  Social Connections: Not on file   SDOH:  SDOH Screenings   Alcohol Screen: Not on file  Depression (PHQ2-9): Low Risk  (09/23/2021)   Depression (PHQ2-9)    PHQ-2 Score: 2  Recent Concern: Depression (PHQ2-9) - Medium Risk (09/21/2021)   Depression (PHQ2-9)    PHQ-2 Score: 7  Financial Resource Strain: Not on file  Food Insecurity: Not on file  Housing: Not on file  Physical Activity: Not on file  Social Connections: Not on file  Stress: Not on file  Tobacco Use: High Risk (09/23/2021)   Patient History    Smoking Tobacco Use: Every Day    Smokeless Tobacco Use: Never    Passive Exposure: Not on file  Transportation Needs: Not on file    Tobacco Cessation:    Current Medications:  Current Facility-Administered Medications  Medication Dose Route Frequency Provider Last Rate Last Admin   acetaminophen (TYLENOL) tablet 650 mg  650 mg Oral Q6H PRN Revonda Humphrey, NP   650 mg at 09/22/21 2141   albuterol (VENTOLIN HFA) 108 (90 Base) MCG/ACT inhaler 2 puff  2 puff Inhalation Q6H PRN Revonda Humphrey, NP       alum & mag hydroxide-simeth (MAALOX/MYLANTA) 200-200-20 MG/5ML suspension 30 mL  30 mL Oral Q4H PRN Revonda Humphrey, NP   30 mL at 09/21/21 1350   hydrochlorothiazide (HYDRODIURIL) tablet 25 mg  25 mg Oral Daily Revonda Humphrey, NP   25 mg at 09/23/21 0925   hydrOXYzine (ATARAX) tablet 25 mg  25 mg Oral Q6H PRN Revonda Humphrey, NP   25 mg at 09/22/21 2138   irbesartan (AVAPRO) tablet 300 mg  300 mg Oral Daily Revonda Humphrey, NP   300 mg at 09/23/21 [id number]   loperamide (IMODIUM) capsule 2-4 mg  2-4 mg Oral PRN Revonda Humphrey, NP       LORazepam (ATIVAN) tablet 1 mg  1 mg Oral Q6H PRN Revonda Humphrey, NP       LORazepam (ATIVAN) tablet 1 mg  1 mg Oral BID Revonda Humphrey, NP   1 mg at 09/23/21 [id number]    Followed by   Derrill Memo ON 09/24/2021] LORazepam (ATIVAN) tablet 1 mg  1 mg Oral Daily Revonda Humphrey, NP       magnesium hydroxide (MILK OF MAGNESIA) suspension 30 mL  30 mL Oral Daily PRN Revonda Humphrey, NP       magnesium oxide (MAG-OX) tablet 400 mg  400 mg Oral Daily Thomes Lolling  H, NP   400 mg at 09/23/21 [id number]   multivitamin with minerals tablet 1 tablet  1 tablet Oral Daily Revonda Humphrey, NP   1 tablet at 09/23/21 [id number]   nicotine (NICODERM CQ - dosed in mg/24 hours) patch 21 mg  21 mg Transdermal Daily Thomes Lolling H, NP   21 mg at 09/23/21 0926   ondansetron (ZOFRAN-ODT) disintegrating tablet 4 mg  4 mg Oral Q6H PRN Revonda Humphrey, NP       pantoprazole (PROTONIX) EC tablet 40 mg  40 mg Oral Daily Thomes Lolling H, NP   40 mg at 09/23/21 0926   rosuvastatin (CRESTOR) tablet 10 mg  10 mg Oral Daily Revonda Humphrey, NP   10 mg at 09/23/21 [id number]   thiamine tablet 100 mg  100 mg Oral Daily Revonda Humphrey, NP   100 mg at 09/23/21 [id number]   Current Outpatient Medications  Medication Sig Dispense Refill   aspirin EC 81 MG tablet Take 81 mg by mouth daily. Swallow whole.     Cyanocobalamin (VITAMIN B-12 PO) Take 1 tablet by mouth daily.     cyclobenzaprine (FLEXERIL) 10 MG tablet Take 1 tablet (10 mg total) by mouth at bedtime. (Patient taking differently: Take 10 mg by mouth at bedtime as needed for muscle spasms.) 10 tablet 0   doxepin (SINEQUAN) 10 MG capsule Take 2 capsules (20 mg total) by mouth at bedtime. 30 capsule 1   fluticasone (FLONASE) 50 MCG/ACT nasal spray Place 2 sprays into both nostrils daily. (Patient taking differently: Place 2 sprays into both nostrils daily as needed for allergies.) 16 g 6   ibuprofen (ADVIL) 200 MG tablet Take 400 mg by mouth every 6 (six) hours as needed for headache.     MAGNESIUM PO Take 1 capsule by mouth daily.     Multiple Vitamin (MULTIVITAMIN WITH MINERALS) TABS tablet Take 1 tablet by mouth daily.     nitroGLYCERIN  (NITROSTAT) 0.4 MG SL tablet PLACE 1 TABLET UNDER THE TONGUE EVERY 5 MINUTES AS NEEDED FOR CHEST PAIN. (Patient taking differently: 0.4 mg every 5 (five) minutes as needed for chest pain.) 25 tablet 0   traZODone (DESYREL) 50 MG tablet TAKE 0.5-1 TABLETS (25-50 MG TOTAL) BY MOUTH AT BEDTIME AS NEEDED FOR SLEEP. (Patient taking differently: Take 50 mg by mouth at bedtime.) 30 tablet 0   albuterol (VENTOLIN HFA) 108 (90 Base) MCG/ACT inhaler Inhale 2 puffs into the lungs every 6 (six) hours as needed for wheezing 6.7 g 0   esomeprazole (NEXIUM) 20 MG capsule Take 1 capsule (20 mg total) by mouth daily as needed (For heartburn or acid reflux). 30 capsule 0   [START ON 09/24/2021] hydrochlorothiazide (HYDRODIURIL) 25 MG tablet Take 1 tablet (25 mg total) by mouth daily. 30 tablet 0   [START ON 09/24/2021] irbesartan (AVAPRO) 300 MG tablet Take 1 tablet (300 mg total) by mouth daily. 30 tablet 0   [START ON 09/24/2021] nicotine (NICODERM CQ - DOSED IN MG/24 HOURS) 21 mg/24hr patch Place 1 patch (21 mg total) onto the skin daily for 28 days. 28 patch 0   rosuvastatin (CRESTOR) 10 MG tablet Take 1 tablet (10 mg total) by mouth daily. 30 tablet 0    PTA Medications: (Not in a hospital admission)      09/23/2021   10:39 AM 09/21/2021   11:34 AM 04/01/2020    8:14 AM  Depression screen PHQ 2/9  Decreased Interest 0 0 0  Down, Depressed, Hopeless  0 1 0  PHQ - 2 Score 0 1 0  Altered sleeping 1 3 0  Tired, decreased energy 1 1 0  Change in appetite 0 1 0  Feeling bad or failure about yourself  0 1 0  Trouble concentrating 0 0 0  Moving slowly or fidgety/restless 0 0 0  Suicidal thoughts 0 0 0  PHQ-9 Score 2 7 0  Difficult doing work/chores Not difficult at all Somewhat difficult     Flowsheet Row ED from 09/21/2021 in [facility] ED from 08/16/2021 in [facility] RISK CATEGORY No Risk No Risk       Musculoskeletal  Strength & Muscle  Tone: within normal limits Gait & Station: normal Patient leans: N/A  Psychiatric Specialty Exam  Presentation  General Appearance: Appropriate for Environment; Casual; Fairly Groomed  Eye Contact:Good  Speech:Clear and Coherent; Normal Rate  Speech Volume:Normal  Handedness:Right   Mood and Affect  Mood:Euthymic  Affect:Congruent   Thought Process  Thought Processes:Coherent  Descriptions of Associations:Intact  Orientation:Full (Time, Place and Person)  Thought Content:Logical  Diagnosis of Schizophrenia or Schizoaffective disorder in past: No    Hallucinations:Hallucinations: None  Ideas of Reference:None  Suicidal Thoughts:Suicidal Thoughts: No  Homicidal Thoughts:Homicidal Thoughts: No   Sensorium  Memory:Immediate Good; Recent Good; Remote Good  Judgment:Good  Insight:Good   Executive Functions  Concentration:Good  Attention Span:Good  Recall:Good  Fund of Knowledge:Good  Language:Good   Psychomotor Activity  Psychomotor Activity:Psychomotor Activity: Normal   Assets  Assets:Communication Skills; Financial Resources/Insurance; Desire for Improvement; Housing; Social Support; Resilience   Sleep  Sleep:Sleep: Good   No data recorded  Physical Exam  Physical Exam Cardiovascular:     Rate and Rhythm: Normal rate.  Pulmonary:     Effort: Pulmonary effort is normal.  Neurological:     Mental Status: He is alert and oriented to person, place, and time.  Psychiatric:        Attention and Perception: Attention and perception normal.        Mood and Affect: Mood and affect normal.        Speech: Speech normal.        Behavior: Behavior normal. Behavior is cooperative.        Thought Content: Thought content normal.        Cognition and Memory: Cognition and memory normal.        Judgment: Judgment normal.    Review of Systems  Constitutional:  Negative for chills and fever.  Eyes:  Negative for blurred vision and double  vision.  Respiratory:  Negative for shortness of breath.   Cardiovascular:  Negative for chest pain and palpitations.  Gastrointestinal:  Negative for abdominal pain, diarrhea, nausea and vomiting.  Neurological:  Negative for dizziness, loss of consciousness, weakness and headaches.  Psychiatric/Behavioral:  Negative for depression, hallucinations, memory loss and suicidal ideas. The patient is not nervous/anxious and does not have insomnia.    Blood pressure (!) 125/107, pulse 72, temperature 98.1 F (36.7 C), temperature source Oral, resp. rate 20, SpO2 99 %. There is no height or weight on file to calculate BMI.  Demographic Factors:  Male and Living alone  Loss Factors: NA  Historical Factors: NA  Risk Reduction Factors:   Positive social support  Continued Clinical Symptoms:  Alcohol/Substance Abuse/Dependencies  Cognitive Features That Contribute To Risk:  None    Suicide Risk:  Minimal: No identifiable suicidal ideation.  Patients presenting with no  risk factors but with morbid ruminations; may be classified as minimal risk based on the severity of the depressive symptoms  Plan Of Care/Follow-up recommendations:  CDIOP program Close PCP follow up  Disposition:  Discharge  Lauree Chandler, NP 09/23/2021, 3:27 PM

## 2021-09-23 NOTE — ED Notes (Signed)
Pt sleeping in no acute distress. RR even and unlabored. Safety maintained. 

## 2021-09-23 NOTE — ED Notes (Signed)
Pt sleeping at present, no distress noted.  Monitoring for safety. 

## 2021-09-23 NOTE — ED Notes (Signed)
Pt in dayroom calm and cooperative no c/o pain or distress. Will continue to monitor for safety

## 2021-09-23 NOTE — ED Notes (Signed)
Pt interacting with peers in no acute distress. Denies concerns at present. Will continue to monitor for safety.

## 2021-09-23 NOTE — ED Notes (Signed)
Patient attended group the title was Journey to Recovery we discussed self-direction, Individualized and person-centered, Empowerment, Strength based recovery, Peer support, Respect, Responsibility, and Hope. This came with a work sheet. We also discussed ways to Identify triggers and ways to reduce setbacks, there was a worksheet for that also. We discussed ways to stay active and healthy, so that their concentration would not be so much on what was bothering them, but they could put their focus on other more enjoyable things, like music or going for a walk, spending time with family or good friends, exercising, or talking to a therapist something to put you mind at ease, without causing you to relapse or cause you any distress, coloring or putting a puzzle together, a crossword puzzle, or sewing, there are dozens of things to do if you really want to change your life and get back what you believe you have lost. 

## 2021-09-23 NOTE — ED Notes (Signed)
Pt in dayroom interacting with peers in no acute distress. Denies concerns at present. Will continue to monitor for safety.

## 2021-09-23 NOTE — Progress Notes (Signed)
Spirituality group facilitated by Chaplain Gagandeep Kossman, MDiv, B[facility].  Group Description: Group focused on topic of hope. Patients participated in facilitated discussion around topic, connecting with one another around experiences and definitions for hope. Group members engaged with visual explorer photos, reflecting on what hope looks like for them today. Group engaged in discussion around how their definitions of hope are present today in hospital.  Modalities: Psycho-social ed, Adlerian, Narrative, MI  Patient Progress: 

## 2021-09-24 ENCOUNTER — Other Ambulatory Visit (HOSPITAL_COMMUNITY): Payer: Self-pay

## 2021-09-24 NOTE — ED Notes (Signed)
Patient stated he will be going to  AA meetings to deal with his alcohol use disorder. Patient is stable calm and cooperative. No complaints of pain or discomfort.

## 2021-09-24 NOTE — ED Notes (Addendum)
Patient has been discharged to home in [place]. His belongings were returned from locker # 1. He signed his belonging sheet. He was stable when he left and determined to stay sober and will be going to [employer] [facility] and discharge instructions were provided along with his prescriptions. He was also given a work note to return to work on Monday September 27, 2021.

## 2021-09-24 NOTE — ED Notes (Signed)
Pt refused scheduled Nicotine patch. Stated he was d/c from the facility and would not need it today. Pt accepted all other PO scheduled meds w/o difficulty. Safety maintained and will continue to monitor.

## 2021-09-24 NOTE — ED Notes (Signed)
Pt sleeping at present, no distress noted.  Monitoring for safety. 

## 2021-09-24 NOTE — ED Notes (Addendum)
Patient is awake and has eaten breakfast and is ready to go home. "I learned my lesson and I'm going to AA meeting". Patient is stable and will be discharged today."

## 2021-09-24 NOTE — ED Notes (Signed)
Pt was given cereal and a muffin for breakfast.  ?

## 2021-09-27 ENCOUNTER — Other Ambulatory Visit (HOSPITAL_COMMUNITY): Payer: Self-pay

## 2021-09-27 DIAGNOSIS — Z1211 Encounter for screening for malignant neoplasm of colon: Secondary | ICD-10-CM | POA: Diagnosis not present

## 2021-09-27 DIAGNOSIS — F1020 Alcohol dependence, uncomplicated: Secondary | ICD-10-CM | POA: Diagnosis not present

## 2021-09-27 DIAGNOSIS — F331 Major depressive disorder, recurrent, moderate: Secondary | ICD-10-CM | POA: Diagnosis not present

## 2021-09-27 DIAGNOSIS — I10 Essential (primary) hypertension: Secondary | ICD-10-CM | POA: Diagnosis not present

## 2021-09-27 DIAGNOSIS — E669 Obesity, unspecified: Secondary | ICD-10-CM | POA: Diagnosis not present

## 2021-09-27 DIAGNOSIS — R195 Other fecal abnormalities: Secondary | ICD-10-CM | POA: Diagnosis not present

## 2021-09-27 DIAGNOSIS — G4726 Circadian rhythm sleep disorder, shift work type: Secondary | ICD-10-CM | POA: Diagnosis not present

## 2021-09-27 DIAGNOSIS — F17210 Nicotine dependence, cigarettes, uncomplicated: Secondary | ICD-10-CM | POA: Diagnosis not present

## 2021-09-27 MED ORDER — BUPROPION HCL ER (XL) 300 MG PO TB24
300.0000 mg | ORAL_TABLET | Freq: Every day | ORAL | 2 refills | Status: DC
  Filled 2021-09-27: qty 30, 30d supply, fill #0
  Filled 2021-10-22: qty 30, 30d supply, fill #1
  Filled 2021-11-25: qty 30, 30d supply, fill #2

## 2021-09-27 MED ORDER — DOXEPIN HCL 10 MG PO CAPS
20.0000 mg | ORAL_CAPSULE | Freq: Every evening | ORAL | 2 refills | Status: DC
  Filled 2021-09-27: qty 60, 30d supply, fill #0
  Filled 2021-10-22: qty 60, 30d supply, fill #1
  Filled 2021-11-25: qty 60, 30d supply, fill #2

## 2021-10-22 ENCOUNTER — Other Ambulatory Visit: Payer: Self-pay | Admitting: Internal Medicine

## 2021-10-22 ENCOUNTER — Other Ambulatory Visit: Payer: Self-pay

## 2021-10-22 ENCOUNTER — Other Ambulatory Visit (HOSPITAL_COMMUNITY): Payer: Self-pay

## 2021-10-26 ENCOUNTER — Other Ambulatory Visit (HOSPITAL_COMMUNITY): Payer: Self-pay

## 2021-10-27 ENCOUNTER — Other Ambulatory Visit (HOSPITAL_COMMUNITY): Payer: Self-pay

## 2021-10-28 ENCOUNTER — Other Ambulatory Visit (HOSPITAL_COMMUNITY): Payer: Self-pay

## 2021-10-28 MED ORDER — IRBESARTAN 300 MG PO TABS
300.0000 mg | ORAL_TABLET | Freq: Every day | ORAL | 5 refills | Status: DC
  Filled 2021-10-28: qty 30, 30d supply, fill #0
  Filled 2021-11-25: qty 30, 30d supply, fill #1
  Filled 2021-12-28 – 2022-01-24 (×2): qty 30, 30d supply, fill #2
  Filled 2022-02-24: qty 30, 30d supply, fill #3
  Filled 2022-03-28: qty 30, 30d supply, fill #4
  Filled 2022-04-25: qty 30, 30d supply, fill #5

## 2021-10-28 MED ORDER — HYDROCHLOROTHIAZIDE 25 MG PO TABS
25.0000 mg | ORAL_TABLET | Freq: Every day | ORAL | 5 refills | Status: DC
  Filled 2021-10-28: qty 30, 30d supply, fill #0
  Filled 2021-11-25: qty 30, 30d supply, fill #1
  Filled 2021-12-28 – 2022-01-24 (×2): qty 30, 30d supply, fill #2
  Filled 2022-02-24: qty 30, 30d supply, fill #3
  Filled 2022-03-28: qty 30, 30d supply, fill #4
  Filled 2022-04-25: qty 30, 30d supply, fill #5

## 2021-10-28 MED ORDER — ROSUVASTATIN CALCIUM 10 MG PO TABS
10.0000 mg | ORAL_TABLET | Freq: Every day | ORAL | 5 refills | Status: DC
  Filled 2021-10-28: qty 30, 30d supply, fill #0
  Filled 2021-11-25: qty 30, 30d supply, fill #1
  Filled 2021-12-28 – 2022-01-24 (×2): qty 30, 30d supply, fill #2

## 2021-11-18 ENCOUNTER — Other Ambulatory Visit: Payer: Self-pay | Admitting: Internal Medicine

## 2021-11-18 ENCOUNTER — Other Ambulatory Visit (HOSPITAL_COMMUNITY): Payer: Self-pay

## 2021-11-19 ENCOUNTER — Other Ambulatory Visit (HOSPITAL_COMMUNITY): Payer: Self-pay

## 2021-11-24 ENCOUNTER — Other Ambulatory Visit (HOSPITAL_COMMUNITY): Payer: Self-pay

## 2021-11-24 DIAGNOSIS — Z1211 Encounter for screening for malignant neoplasm of colon: Secondary | ICD-10-CM | POA: Diagnosis not present

## 2021-11-24 MED ORDER — BISACODYL 5 MG PO TBEC
10.0000 mg | DELAYED_RELEASE_TABLET | Freq: Once | ORAL | 0 refills | Status: DC
  Filled 2021-11-24: qty 2, 1d supply, fill #0

## 2021-11-24 MED ORDER — PEG 3350-KCL-NA BICARB-NACL 420 G PO SOLR
ORAL | 0 refills | Status: DC
  Filled 2021-11-24: qty 4000, 1d supply, fill #0

## 2021-11-25 ENCOUNTER — Other Ambulatory Visit (HOSPITAL_COMMUNITY): Payer: Self-pay

## 2021-11-30 ENCOUNTER — Other Ambulatory Visit (HOSPITAL_COMMUNITY): Payer: Self-pay

## 2021-12-14 ENCOUNTER — Other Ambulatory Visit (HOSPITAL_COMMUNITY): Payer: Self-pay

## 2021-12-18 DIAGNOSIS — T07XXXA Unspecified multiple injuries, initial encounter: Secondary | ICD-10-CM | POA: Diagnosis not present

## 2021-12-18 DIAGNOSIS — R000 Tachycardia, unspecified: Secondary | ICD-10-CM | POA: Diagnosis not present

## 2021-12-18 DIAGNOSIS — M79605 Pain in left leg: Secondary | ICD-10-CM | POA: Diagnosis not present

## 2021-12-23 DIAGNOSIS — S71131D Puncture wound without foreign body, right thigh, subsequent encounter: Secondary | ICD-10-CM | POA: Diagnosis not present

## 2021-12-28 ENCOUNTER — Other Ambulatory Visit (HOSPITAL_COMMUNITY): Payer: Self-pay

## 2021-12-28 DIAGNOSIS — F331 Major depressive disorder, recurrent, moderate: Secondary | ICD-10-CM | POA: Diagnosis not present

## 2021-12-28 DIAGNOSIS — F4322 Adjustment disorder with anxiety: Secondary | ICD-10-CM | POA: Diagnosis not present

## 2021-12-28 DIAGNOSIS — W3400XA Accidental discharge from unspecified firearms or gun, initial encounter: Secondary | ICD-10-CM | POA: Diagnosis not present

## 2021-12-28 DIAGNOSIS — G4726 Circadian rhythm sleep disorder, shift work type: Secondary | ICD-10-CM | POA: Diagnosis not present

## 2021-12-28 MED ORDER — ESCITALOPRAM OXALATE 10 MG PO TABS
10.0000 mg | ORAL_TABLET | Freq: Every day | ORAL | 1 refills | Status: DC
  Filled 2021-12-28: qty 30, 30d supply, fill #0
  Filled 2022-01-24: qty 30, 30d supply, fill #1

## 2021-12-28 MED ORDER — BUSPIRONE HCL 5 MG PO TABS
5.0000 mg | ORAL_TABLET | Freq: Three times a day (TID) | ORAL | 0 refills | Status: DC
  Filled 2021-12-28: qty 60, 20d supply, fill #0

## 2021-12-28 MED ORDER — DOXEPIN HCL 10 MG PO CAPS
20.0000 mg | ORAL_CAPSULE | Freq: Every evening | ORAL | 2 refills | Status: DC
Start: 2021-12-28 — End: 2022-04-25
  Filled 2021-12-28 – 2022-01-24 (×2): qty 60, 30d supply, fill #0
  Filled 2022-02-24: qty 60, 30d supply, fill #1
  Filled 2022-03-28: qty 60, 30d supply, fill #2

## 2021-12-29 ENCOUNTER — Other Ambulatory Visit (HOSPITAL_COMMUNITY): Payer: Self-pay

## 2022-01-05 ENCOUNTER — Other Ambulatory Visit (HOSPITAL_COMMUNITY): Payer: Self-pay

## 2022-01-24 ENCOUNTER — Other Ambulatory Visit (HOSPITAL_COMMUNITY): Payer: Self-pay

## 2022-01-25 ENCOUNTER — Other Ambulatory Visit (HOSPITAL_COMMUNITY): Payer: Self-pay

## 2022-01-25 MED ORDER — BUSPIRONE HCL 5 MG PO TABS
5.0000 mg | ORAL_TABLET | Freq: Three times a day (TID) | ORAL | 0 refills | Status: DC
  Filled 2022-01-25: qty 60, 20d supply, fill #0

## 2022-01-25 MED ORDER — BUPROPION HCL ER (XL) 300 MG PO TB24
300.0000 mg | ORAL_TABLET | Freq: Every day | ORAL | 2 refills | Status: DC
  Filled 2022-01-25: qty 30, 30d supply, fill #0
  Filled 2022-02-24: qty 30, 30d supply, fill #1
  Filled 2022-03-28: qty 30, 30d supply, fill #2

## 2022-01-26 ENCOUNTER — Other Ambulatory Visit (HOSPITAL_COMMUNITY): Payer: Self-pay

## 2022-01-27 ENCOUNTER — Other Ambulatory Visit (HOSPITAL_COMMUNITY): Payer: Self-pay

## 2022-01-28 DIAGNOSIS — F339 Major depressive disorder, recurrent, unspecified: Secondary | ICD-10-CM | POA: Diagnosis not present

## 2022-01-28 DIAGNOSIS — F4322 Adjustment disorder with anxiety: Secondary | ICD-10-CM | POA: Diagnosis not present

## 2022-01-28 DIAGNOSIS — G4709 Other insomnia: Secondary | ICD-10-CM | POA: Diagnosis not present

## 2022-01-28 DIAGNOSIS — M76892 Other specified enthesopathies of left lower limb, excluding foot: Secondary | ICD-10-CM | POA: Diagnosis not present

## 2022-02-05 ENCOUNTER — Observation Stay (HOSPITAL_BASED_OUTPATIENT_CLINIC_OR_DEPARTMENT_OTHER): Payer: 59

## 2022-02-05 ENCOUNTER — Emergency Department (HOSPITAL_COMMUNITY): Payer: 59

## 2022-02-05 ENCOUNTER — Observation Stay (HOSPITAL_COMMUNITY): Payer: 59

## 2022-02-05 ENCOUNTER — Other Ambulatory Visit: Payer: Self-pay

## 2022-02-05 ENCOUNTER — Observation Stay (HOSPITAL_COMMUNITY)
Admission: EM | Admit: 2022-02-05 | Discharge: 2022-02-06 | Disposition: A | Discharge status: Home / Self Care | Payer: 59 | Attending: Internal Medicine | Admitting: Internal Medicine

## 2022-02-05 DIAGNOSIS — R42 Dizziness and giddiness: Secondary | ICD-10-CM | POA: Diagnosis not present

## 2022-02-05 DIAGNOSIS — Z79899 Other long term (current) drug therapy: Secondary | ICD-10-CM | POA: Diagnosis not present

## 2022-02-05 DIAGNOSIS — I10 Essential (primary) hypertension: Secondary | ICD-10-CM | POA: Insufficient documentation

## 2022-02-05 DIAGNOSIS — Z7982 Long term (current) use of aspirin: Secondary | ICD-10-CM | POA: Insufficient documentation

## 2022-02-05 DIAGNOSIS — R519 Headache, unspecified: Secondary | ICD-10-CM | POA: Diagnosis not present

## 2022-02-05 DIAGNOSIS — R208 Other disturbances of skin sensation: Secondary | ICD-10-CM | POA: Diagnosis not present

## 2022-02-05 DIAGNOSIS — I639 Cerebral infarction, unspecified: Secondary | ICD-10-CM | POA: Diagnosis not present

## 2022-02-05 DIAGNOSIS — F17210 Nicotine dependence, cigarettes, uncomplicated: Secondary | ICD-10-CM | POA: Diagnosis not present

## 2022-02-05 DIAGNOSIS — I6389 Other cerebral infarction: Secondary | ICD-10-CM

## 2022-02-05 DIAGNOSIS — E785 Hyperlipidemia, unspecified: Secondary | ICD-10-CM | POA: Diagnosis not present

## 2022-02-05 DIAGNOSIS — Z01818 Encounter for other preprocedural examination: Secondary | ICD-10-CM | POA: Diagnosis not present

## 2022-02-05 LAB — COMPREHENSIVE METABOLIC PANEL
ALT: 35 U/L (ref 0–44)
AST: 27 U/L (ref 15–41)
Albumin: 3.8 g/dL (ref 3.5–5.0)
Alkaline Phosphatase: 49 U/L (ref 38–126)
Anion gap: 12 (ref 5–15)
BUN: 20 mg/dL (ref 6–20)
CO2: 20 mmol/L — ABNORMAL LOW (ref 22–32)
Calcium: 8.9 mg/dL (ref 8.9–10.3)
Chloride: 103 mmol/L (ref 98–111)
Creatinine, Ser: 0.92 mg/dL (ref 0.61–1.24)
GFR, Estimated: >60 mL/min (ref >60)
Glucose, Bld: 117 mg/dL — ABNORMAL HIGH (ref 70–99)
Potassium: 3.7 mmol/L (ref 3.5–5.1)
Sodium: 135 mmol/L (ref 135–145)
Total Bilirubin: 0.6 mg/dL (ref 0.3–1.2)
Total Protein: 6.7 g/dL (ref 6.5–8.1)

## 2022-02-05 LAB — DIFFERENTIAL
Abs Immature Granulocytes: 0.04 10*3/uL (ref 0.00–0.07)
Basophils Absolute: 0 10*3/uL (ref 0.0–0.1)
Basophils Relative: 0 %
Eosinophils Absolute: 0.1 10*3/uL (ref 0.0–0.5)
Eosinophils Relative: 1 %
Immature Granulocytes: 0 %
Lymphocytes Relative: 30 %
Lymphs Abs: 2.8 10*3/uL (ref 0.7–4.0)
Monocytes Absolute: 0.7 10*3/uL (ref 0.1–1.0)
Monocytes Relative: 8 %
Neutro Abs: 5.7 10*3/uL (ref 1.7–7.7)
Neutrophils Relative %: 61 %

## 2022-02-05 LAB — CBC
HCT: 43.7 % (ref 39.0–52.0)
Hemoglobin: 14.6 g/dL (ref 13.0–17.0)
MCH: 34 pg (ref 26.0–34.0)
MCHC: 33.4 g/dL (ref 30.0–36.0)
MCV: 101.6 fL — ABNORMAL HIGH (ref 80.0–100.0)
Platelets: 186 10*3/uL (ref 150–400)
RBC: 4.3 MIL/uL (ref 4.22–5.81)
RDW: 13.5 % (ref 11.5–15.5)
WBC: 9.4 10*3/uL (ref 4.0–10.5)
nRBC: 0 % (ref 0.0–0.2)

## 2022-02-05 LAB — ECHOCARDIOGRAM COMPLETE BUBBLE STUDY
AR max vel: 3.06 cm2
AV Area VTI: 2.81 cm2
AV Area mean vel: 3.07 cm2
AV Mean grad: 6 mmHg
AV Peak grad: 13 mmHg
Ao pk vel: 1.8 m/s
Area-P 1/2: 2.38 cm2
MV VTI: 3.97 cm2
S' Lateral: 2.2 cm

## 2022-02-05 LAB — ETHANOL: Alcohol, Ethyl (B): <10 mg/dL (ref <10)

## 2022-02-05 LAB — MAGNESIUM: Magnesium: 1.9 mg/dL (ref 1.7–2.4)

## 2022-02-05 LAB — LIPID PANEL
Cholesterol: 166 mg/dL (ref 0–200)
HDL: 39 mg/dL — ABNORMAL LOW (ref >40)
LDL Cholesterol: 87 mg/dL (ref 0–99)
Total CHOL/HDL Ratio: 4.3 RATIO
Triglycerides: 200 mg/dL — ABNORMAL HIGH (ref <150)
VLDL: 40 mg/dL (ref 0–40)

## 2022-02-05 LAB — PHOSPHORUS: Phosphorus: 2.7 mg/dL (ref 2.5–4.6)

## 2022-02-05 LAB — PROTIME-INR
INR: 1 (ref 0.8–1.2)
Prothrombin Time: 13 seconds (ref 11.4–15.2)

## 2022-02-05 LAB — I-STAT CHEM 8, ED
BUN: 29 mg/dL — ABNORMAL HIGH (ref 6–20)
Calcium, Ion: 1.09 mmol/L — ABNORMAL LOW (ref 1.15–1.40)
Chloride: 102 mmol/L (ref 98–111)
Creatinine, Ser: 0.9 mg/dL (ref 0.61–1.24)
Glucose, Bld: 112 mg/dL — ABNORMAL HIGH (ref 70–99)
HCT: 45 % (ref 39.0–52.0)
Hemoglobin: 15.3 g/dL (ref 13.0–17.0)
Potassium: 4.4 mmol/L (ref 3.5–5.1)
Sodium: 136 mmol/L (ref 135–145)
TCO2: 26 mmol/L (ref 22–32)

## 2022-02-05 LAB — APTT: aPTT: 32 seconds (ref 24–36)

## 2022-02-05 LAB — HIV ANTIBODY (ROUTINE TESTING W REFLEX): HIV Screen 4th Generation wRfx: NONREACTIVE

## 2022-02-05 LAB — C-REACTIVE PROTEIN: CRP: <0.5 mg/dL (ref <1.0)

## 2022-02-05 LAB — SEDIMENTATION RATE: Sed Rate: 2 mm/hr (ref 0–16)

## 2022-02-05 MED ORDER — HYDROCHLOROTHIAZIDE 25 MG PO TABS
25.0000 mg | ORAL_TABLET | Freq: Every day | ORAL | Status: DC
  Administered 2022-02-06: 25 mg via ORAL
  Filled 2022-02-05: qty 1

## 2022-02-05 MED ORDER — BUPROPION HCL ER (XL) 150 MG PO TB24
300.0000 mg | ORAL_TABLET | Freq: Every day | ORAL | Status: DC
  Administered 2022-02-06: 300 mg via ORAL
  Filled 2022-02-05: qty 2
  Filled 2022-02-05: qty 1

## 2022-02-05 MED ORDER — ONDANSETRON HCL 4 MG/2ML IJ SOLN: 4.0000 mg | Freq: Four times a day (QID) | INTRAMUSCULAR | Status: DC | PRN

## 2022-02-05 MED ORDER — METOCLOPRAMIDE HCL 5 MG/ML IJ SOLN
10.0000 mg | Freq: Once | INTRAMUSCULAR | Status: AC
  Administered 2022-02-05: 10 mg via INTRAVENOUS
  Filled 2022-02-05: qty 2

## 2022-02-05 MED ORDER — ENOXAPARIN SODIUM 40 MG/0.4ML IJ SOSY
40.0000 mg | PREFILLED_SYRINGE | INTRAMUSCULAR | Status: DC
  Administered 2022-02-05 – 2022-02-06 (×2): 40 mg via SUBCUTANEOUS
  Filled 2022-02-05 (×2): qty 0.4

## 2022-02-05 MED ORDER — GADOBUTROL 1 MMOL/ML IV SOLN
10.0000 mL | Freq: Once | INTRAVENOUS | Status: AC | PRN
  Administered 2022-02-05: 10 mL via INTRAVENOUS

## 2022-02-05 MED ORDER — THIAMINE MONONITRATE 100 MG PO TABS
100.0000 mg | ORAL_TABLET | Freq: Every day | ORAL | Status: DC
  Administered 2022-02-05 – 2022-02-06 (×2): 100 mg via ORAL
  Filled 2022-02-05 (×2): qty 1

## 2022-02-05 MED ORDER — ROSUVASTATIN CALCIUM 20 MG PO TABS
20.0000 mg | ORAL_TABLET | Freq: Every day | ORAL | Status: DC
  Administered 2022-02-06: 20 mg via ORAL
  Filled 2022-02-05: qty 1

## 2022-02-05 MED ORDER — ONDANSETRON HCL 4 MG PO TABS: 4.0000 mg | ORAL_TABLET | Freq: Four times a day (QID) | ORAL | Status: DC | PRN

## 2022-02-05 MED ORDER — IOHEXOL 350 MG/ML SOLN
75.0000 mL | Freq: Once | INTRAVENOUS | Status: AC | PRN
  Administered 2022-02-05: 75 mL via INTRAVENOUS

## 2022-02-05 MED ORDER — ACETAMINOPHEN 325 MG PO TABS
650.0000 mg | ORAL_TABLET | Freq: Four times a day (QID) | ORAL | Status: DC | PRN
  Administered 2022-02-06 (×2): 650 mg via ORAL
  Filled 2022-02-05 (×3): qty 2

## 2022-02-05 MED ORDER — CLOPIDOGREL BISULFATE 75 MG PO TABS
75.0000 mg | ORAL_TABLET | Freq: Every day | ORAL | Status: DC
  Administered 2022-02-05 – 2022-02-06 (×2): 75 mg via ORAL
  Filled 2022-02-05 (×2): qty 1

## 2022-02-05 MED ORDER — BUSPIRONE HCL 10 MG PO TABS
5.0000 mg | ORAL_TABLET | Freq: Three times a day (TID) | ORAL | Status: DC
  Administered 2022-02-05 – 2022-02-06 (×2): 5 mg via ORAL
  Filled 2022-02-05 (×2): qty 1

## 2022-02-05 MED ORDER — IRBESARTAN 300 MG PO TABS
300.0000 mg | ORAL_TABLET | Freq: Every day | ORAL | Status: DC
  Administered 2022-02-06: 300 mg via ORAL
  Filled 2022-02-05 (×2): qty 1

## 2022-02-05 MED ORDER — ASPIRIN 81 MG PO CHEW
81.0000 mg | CHEWABLE_TABLET | Freq: Every day | ORAL | Status: DC
  Administered 2022-02-05 – 2022-02-06 (×2): 81 mg via ORAL
  Filled 2022-02-05 (×2): qty 1

## 2022-02-05 MED ORDER — ROSUVASTATIN CALCIUM 5 MG PO TABS: 10.0000 mg | ORAL_TABLET | Freq: Every day | ORAL | Status: DC

## 2022-02-05 MED ORDER — TRAZODONE HCL 50 MG PO TABS
25.0000 mg | ORAL_TABLET | Freq: Every day | ORAL | Status: DC
  Administered 2022-02-05: 25 mg via ORAL
  Filled 2022-02-05: qty 1

## 2022-02-05 MED ORDER — NICOTINE 14 MG/24HR TD PT24
14.0000 mg | MEDICATED_PATCH | Freq: Every day | TRANSDERMAL | Status: DC
  Administered 2022-02-05 – 2022-02-06 (×2): 14 mg via TRANSDERMAL
  Filled 2022-02-05 (×2): qty 1

## 2022-02-05 MED ORDER — ACETAMINOPHEN 650 MG RE SUPP: 650.0000 mg | Freq: Four times a day (QID) | RECTAL | Status: DC | PRN

## 2022-02-05 MED ORDER — FOLIC ACID 1 MG PO TABS
1.0000 mg | ORAL_TABLET | Freq: Every day | ORAL | Status: DC
  Administered 2022-02-05 – 2022-02-06 (×2): 1 mg via ORAL
  Filled 2022-02-05 (×2): qty 1

## 2022-02-05 MED ORDER — THIAMINE HCL 100 MG/ML IJ SOLN
100.0000 mg | Freq: Every day | INTRAMUSCULAR | Status: DC
  Filled 2022-02-05: qty 2

## 2022-02-05 MED ORDER — CYCLOBENZAPRINE HCL 10 MG PO TABS: 10.0000 mg | ORAL_TABLET | Freq: Every evening | ORAL | Status: DC | PRN

## 2022-02-05 MED ORDER — ADULT MULTIVITAMIN W/MINERALS CH
1.0000 | ORAL_TABLET | Freq: Every day | ORAL | Status: DC
  Administered 2022-02-05 – 2022-02-06 (×2): 1 via ORAL
  Filled 2022-02-05 (×2): qty 1

## 2022-02-05 NOTE — ED Provider Notes (Signed)
[facility] EMERGENCY DEPARTMENT Provider Note   CSN: [id number] Arrival date & time: 02/05/22  0141     History  Chief Complaint  Patient presents with   Dizziness    Omar Ward is a 51 y.o. male.  The history is provided by the patient and medical records.  Dizziness Omar Ward is a 51 y.o. male who presents to the Emergency Department complaining of dizziness.  He presents to the ED for evaluation of dizziness that started one week ago.  Sxs were initially waxing and waning, worse with standing.  For the last 24 hrs they have been constant.  Has associated N/V.  Has diffuse HA and neck pain.  Took OTC medication for motion sickness without improvement.  No fever, chest pain, abdominal pain, numbness, weakness.  No prior similar sxs.    No alcohol or drugs.  Has a hx/o HTN     Home Medications Prior to Admission medications   Medication Sig Start Date End Date Taking? Authorizing Provider  albuterol (VENTOLIN HFA) 108 (90 Base) MCG/ACT inhaler Inhale 2 puffs into the lungs every 6 (six) hours as needed for wheezing 09/23/21   Tharon Aquas, NP  aspirin EC 81 MG tablet Take 81 mg by mouth daily. Swallow whole.    [provider]  buPROPion (WELLBUTRIN XL) 300 MG 24 hr tablet Take 1 tablet (300 mg total) by mouth daily. 01/25/22     busPIRone (BUSPAR) 5 MG tablet Take 1 tablet (5 mg total) by mouth 3 (three) times daily. 01/25/22     Cyanocobalamin (VITAMIN B-12 PO) Take 1 tablet by mouth daily.    [provider]  cyclobenzaprine (FLEXERIL) 10 MG tablet Take 1 tablet (10 mg total) by mouth at bedtime. Patient taking differently: Take 10 mg by mouth at bedtime as needed for muscle spasms. 08/16/21   White, Leitha Schuller, NP  doxepin (SINEQUAN) 10 MG capsule Take 2 capsules (20 mg total) by mouth at bedtime. 06/15/21     doxepin (SINEQUAN) 10 MG capsule Take 2 capsules (20 mg total) by mouth nightly 12/28/21     escitalopram (LEXAPRO) 10  MG tablet Take 1 tablet (10 mg total) by mouth Once Daily. 12/28/21     esomeprazole (NEXIUM) 20 MG capsule Take 1 capsule (20 mg total) by mouth daily as needed (For heartburn or acid reflux). 09/23/21 10/24/21  Tharon Aquas, NP  fluticasone (FLONASE) 50 MCG/ACT nasal spray Place 2 sprays into both nostrils daily. Patient taking differently: Place 2 sprays into both nostrils daily as needed for allergies. 07/02/19   Lind Covert, MD  hydrochlorothiazide (HYDRODIURIL) 25 MG tablet Take 1 tablet (25 mg total) by mouth daily. 09/23/21 10/24/21  Tharon Aquas, NP  hydrochlorothiazide (HYDRODIURIL) 25 MG tablet Take 1 tablet (25 mg total) by mouth daily. 10/28/21     ibuprofen (ADVIL) 200 MG tablet Take 400 mg by mouth every 6 (six) hours as needed for headache.    [provider]  irbesartan (AVAPRO) 300 MG tablet Take 1 tablet (300 mg total) by mouth daily. 09/23/21 10/24/21  Tharon Aquas, NP  irbesartan (AVAPRO) 300 MG tablet Take 1 tablet (300 mg total) by mouth daily. 10/28/21     MAGNESIUM PO Take 1 capsule by mouth daily.    [provider]  Multiple Vitamin (MULTIVITAMIN WITH MINERALS) TABS tablet Take 1 tablet by mouth daily.    [provider]  nitroGLYCERIN (NITROSTAT) 0.4 MG SL tablet PLACE  1 TABLET UNDER THE TONGUE EVERY 5 MINUTES AS NEEDED FOR CHEST PAIN. Patient taking differently: 0.4 mg every 5 (five) minutes as needed for chest pain. 12/02/19 09/21/21  Uvaldo Bristle, FNP  polyethylene glycol-electrolytes (NULYTELY) 420 g solution Take as directed by the office. 11/24/21     rosuvastatin (CRESTOR) 10 MG tablet Take 1 tablet (10 mg total) by mouth daily. 09/23/21 10/24/21  Lauree Chandler, NP  rosuvastatin (CRESTOR) 10 MG tablet Take 1 tablet (10 mg total) by mouth daily. 10/28/21     traZODone (DESYREL) 50 MG tablet TAKE 0.5-1 TABLETS (25-50 MG TOTAL) BY MOUTH AT BEDTIME AS NEEDED FOR SLEEP. Patient taking differently: Take 50 mg by mouth at  bedtime. 06/15/21 06/15/22  Plotnikov, Georgina Quint, MD      Allergies    Amlodipine    Review of Systems   Review of Systems  Neurological:  Positive for dizziness.  All other systems reviewed and are negative.   Physical Exam Updated Vital Signs BP (!) 140/99   Pulse 64   Temp 97.9 F (36.6 C)   Resp 12   SpO2 99%  Physical Exam Vitals and nursing note reviewed.  Constitutional:      Appearance: He is well-developed.  HENT:     Head: Normocephalic and atraumatic.  Cardiovascular:     Rate and Rhythm: Normal rate and regular rhythm.     Heart sounds: No murmur heard. Pulmonary:     Effort: Pulmonary effort is normal. No respiratory distress.     Breath sounds: Normal breath sounds.  Abdominal:     Palpations: Abdomen is soft.     Tenderness: There is no abdominal tenderness. There is no guarding or rebound.  Musculoskeletal:        General: No tenderness.  Skin:    General: Skin is warm and dry.  Neurological:     Mental Status: He is alert and oriented to person, place, and time.     Comments: No asymmetry of facial movements. Visual fields grossly intact.  5/5 strength in all four extremities.  Very mild ataxia on finger-to-nose bilaterally, left greater than right.  Psychiatric:        Behavior: Behavior normal.     ED Results / Procedures / Treatments   Labs (all labs ordered are listed, but only abnormal results are displayed) Labs Reviewed  CBC - Abnormal; Notable for the following components:      Result Value   MCV 101.6 (*)    All other components within normal limits  COMPREHENSIVE METABOLIC PANEL - Abnormal; Notable for the following components:   CO2 20 (*)    Glucose, Bld 117 (*)    All other components within normal limits  I-STAT CHEM 8, ED - Abnormal; Notable for the following components:   BUN 29 (*)    Glucose, Bld 112 (*)    Calcium, Ion 1.09 (*)    All other components within normal limits  PROTIME-INR  APTT  DIFFERENTIAL  ETHANOL   CBG MONITORING, ED    EKG EKG Interpretation  Date/Time:  Saturday February 05 2022 01:52:14 EST Ventricular Rate:  68 PR Interval:  146 QRS Duration: 94 QT Interval:  434 QTC Calculation: 461 R Axis:   60 Text Interpretation: Normal sinus rhythm Incomplete right bundle branch block Borderline ECG Confirmed by Tilden Fossa [phone]) on 02/05/2022 2:39:25 AM  Radiology CT Angio Head Neck W WO CM  Addendum Date: 02/05/2022   ADDENDUM REPORT: 02/05/2022 06:59 ADDENDUM: Study discussed  by telephone with Dr. Quintella Reichert on 02/05/2022 at 0641 hours. Electronically Signed   By: Genevie Ann M.D.   On: 02/05/2022 06:59   Result Date: 02/05/2022 CLINICAL DATA:  51 year old male with severe headache and dizziness for the past week. Vertigo. EXAM: CT ANGIOGRAPHY HEAD AND NECK TECHNIQUE: Multidetector CT imaging of the head and neck was performed using the standard protocol during bolus administration of intravenous contrast. Multiplanar CT image reconstructions and MIPs were obtained to evaluate the vascular anatomy. Carotid stenosis measurements (when applicable) are obtained utilizing NASCET criteria, using the distal internal carotid diameter as the denominator. RADIATION DOSE REDUCTION: This exam was performed according to the departmental dose-optimization program which includes automated exposure control, adjustment of the mA and/or kV according to patient size and/or use of iterative reconstruction technique. CONTRAST:  30mL OMNIPAQUE IOHEXOL 350 MG/ML SOLN COMPARISON:  Head CT 11/08/2014. FINDINGS: CT HEAD FINDINGS Brain: Cerebral volume is within normal limits for age. No midline shift, ventriculomegaly, mass effect, evidence of mass lesion, intracranial hemorrhage. Somewhat indistinct patchy hypodensity in the left cerebellum including the left tonsil is suspicious for recent left PICA infarct. No associated hemorrhage or mass effect. Contralateral much smaller right cerebellar hypodensity also on  series 5, image 6 might be chronic but is new since 2016. Vascular: Mild Calcified atherosclerosis at the skull base. Skull: Intact, negative. Sinuses/Orbits: Tympanic cavities are clear. Mastoids are well aerated, stable. Trace paranasal sinus mucosal thickening. No sinus fluid levels. Other: Small retained left forehead metallic foreign body overlying the outer table of the skull was not visible previously. Otherwise negative orbits and scalp. CTA NECK Skeleton: Negative. Upper chest: Mild pulmonary atelectasis. Other neck: Mild postinflammatory tonsillar dystrophic calcifications. Aortic arch: 3 vessel arch configuration. Mild arch tortuosity. Minimal arch atherosclerosis. Right carotid system: Minimal atherosclerosis at the right carotid bifurcation. Mild tortuosity and no stenosis. Left carotid system: Similar mild tortuosity and minimal atherosclerosis without stenosis. Vertebral arteries: Mild soft plaque in the proximal right subclavian artery with no significant stenosis. Right vertebral artery origin is normal. Right vertebral artery is mildly non dominant but patent and within normal limits to the skull base. No significant proximal left subclavian plaque. Left vertebral artery origin is normal. Tortuous left V1 segment. Dominant left vertebral artery is patent and within normal limits to the skull base. CTA HEAD Posterior circulation: Distal vertebral arteries and vertebrobasilar junction are patent with no significant plaque or stenosis. Left V4 is dominant. The left PICA origin is patent on series 11, image 160, but the left PICA is otherwise highly attenuated, poorly enhancing. Contralateral right PICA origin is normal. But there is moderate to severe distal right PICA irregularity and stenosis on series 16, image 21. Patent basilar artery without stenosis. Normal SCA and PCA origins. Right posterior communicating artery, the left is diminutive or absent. Bilateral PCA branches are within normal  limits. Anterior circulation: Both ICA siphons are patent. No plaque or stenosis on the left. Minimal right siphon calcified plaque in the supraclinoid segment without stenosis. Normal ophthalmic and right posterior communicating artery origins. Patent carotid termini. Normal MCA and ACA origins. Diminutive or absent anterior communicating artery. Bilateral ACA branches are within normal limits. Left MCA M1 segment and trifurcation are patent without stenosis. Right MCA M1 segment and bifurcation are patent without stenosis. Bilateral MCA branches are within normal limits. Venous sinuses: Patent. Anatomic variants: Mildly dominant right vertebral artery. Review of the MIP images confirms the above findings IMPRESSION: 1. Positive for patchy Acute/Subacute Left PICA  Cerebellar Infarcts. No hemorrhagic transformation or significant mass effect. A smaller Right cerebellar infarct might be chronic (see #2). 2. 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 And contralateral Right PICA irregularity with tandem stenoses. But both vertebral arteries remain normal, and remaining posterior circulation is negative. 3. Minimal carotid atherosclerosis. No other significant arterial stenosis. 4. Punctate retained metallic foreign body in the left forehead scalp, new since 2016. Electronically Signed: By: Genevie Ann M.D. On: 02/05/2022 06:41    Procedures Procedures    Medications Ordered in ED Medications  metoCLOPramide (REGLAN) injection 10 mg (10 mg Intravenous Given 02/05/22 0307)  iohexol (OMNIPAQUE) 350 MG/ML injection 75 mL (75 mLs Intravenous Contrast Given 02/05/22 0620)    ED Course/ Medical Decision Making/ A&P                           Medical Decision Making Amount and/or Complexity of Data Reviewed Labs: ordered. Radiology: ordered.  Risk Prescription drug management. Decision regarding hospitalization.   Patient with history of hypertension, hyperlipidemia, tobacco use here for evaluation of  dizziness for 1 week.  He has mild bilateral ataxia on examination otherwise no deficits.  CTA was obtained given severe headache, which demonstrates bilateral cerebellar infarcts.  He is not a tPA or intervention candidate due to duration of symptoms.  Discussed with Dr. Quinn Axe with neurology-will see the patient in consult.  Patient updated of findings of studies and recommendation for admission and he is in agreement treatment plan.  Medicine consulted for admission.  He was treated with metoclopramide for his headache with significant improvement in his headache.  He does have ongoing vertigo after medications.        Final Clinical Impression(s) / ED Diagnoses Final diagnoses:  Acute CVA (cerebrovascular accident) [facility])    Rx / [place] Orders ED Discharge Orders     None         Quintella Reichert, MD 02/05/22 [phone]
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
## 2022-02-05 NOTE — Evaluation (Signed)
Occupational Therapy Evaluation Patient Details Name: Omar Ward MRN: [id number] DOB: 03-31-1970 Today's Date: 02/05/2022   History of Present Illness 51 yo M with a PMH of HLD, HTN, anxiety, alcohol use disorder and tobacco use disorder who presents to the ED with dizziness and disequilibrium.  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   Clinical Impression   Patient admitted for the diagnosis above.  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  He works close to 6 days/week, and needs no assist with any aspect of ADL, iADL or mobility.  Primary deficit is unsteadiness with walking and looking/turning his head to the left.  The patient did lose his balance, but was able to recover.  He subjectively states this has improved from last night, and he is hoping to discharge home today if cleared medically.  Otherwise he is very close to his baseline.  No acute OT needs exist, and no post acute OT is needed.  BEFAST education completed with good understanding.  His spouse is at home, and can provide any needed assistance.       Recommendations for follow up therapy are one component of a multi-disciplinary discharge planning process, led by the attending physician.  Recommendations may be updated based on patient status, additional functional criteria and insurance authorization.   Follow Up Recommendations  No OT follow up     Assistance Recommended at Discharge PRN  Patient can return home with the following Assist for transportation    Functional Status Assessment  Patient has not had a recent decline in their functional status  Equipment Recommendations  None recommended by OT    Recommendations for Other Services       Precautions / Restrictions Precautions Precautions: Fall Restrictions Weight Bearing Restrictions: No      Mobility Bed Mobility Overal bed mobility: Independent                  Transfers Overall transfer  level: Needs assistance Equipment used: None Transfers: Sit to/from Stand Sit to Stand: Independent           General transfer comment: patient largely Ind with mobility in the hall.  One LOB with looking to the L, but able to recover.  Initial supervision, then progressed to Ind      Balance Overall balance assessment: Mild deficits observed, not formally tested                                         ADL either performed or assessed with clinical judgement   ADL Overall ADL's : At baseline                                             Vision Patient Visual Report: No change from baseline;Blurring of vision       Perception     Praxis      Pertinent Vitals/Pain Pain Assessment Pain Assessment: No/denies pain     Hand Dominance Right   Extremity/Trunk Assessment Upper Extremity Assessment Upper Extremity Assessment: Overall WFL for tasks assessed   Lower Extremity Assessment Lower Extremity Assessment: Overall WFL for tasks assessed   Cervical / Trunk Assessment Cervical / Trunk Assessment: Normal   Communication Communication Communication: No difficulties  Cognition Arousal/Alertness: Awake/alert Behavior During Therapy: WFL for tasks assessed/performed Overall Cognitive Status: Within Functional Limits for tasks assessed                                       General Comments   VSS on RA    Exercises     Shoulder Instructions      Home Living Family/patient expects to be discharged to:: Private residence Living Arrangements: Spouse/significant other Available Help at Discharge: Family;Available 24 hours/day Type of Home: House Home Access: Level entry     Home Layout: One level     Bathroom Shower/Tub: Chief Strategy Officer: Standard     Home Equipment: None          Prior Functioning/Environment Prior Level of Function : Independent/Modified  Independent;Working/employed;Driving                        OT Problem List: Impaired balance (sitting and/or standing)      OT Treatment/Interventions:      OT Goals(Current goals can be found in the care plan section) Acute Rehab OT Goals Patient Stated Goal: Hoping to go home today OT Goal Formulation: With patient Time For Goal Achievement: 02/07/22 Potential to Achieve Goals: Good  OT Frequency:      Co-evaluation              AM-PAC OT "6 Clicks" Daily Activity     Outcome Measure Help from another person eating meals?: None Help from another person taking care of personal grooming?: None Help from another person toileting, which includes using toliet, bedpan, or urinal?: None Help from another person bathing (including washing, rinsing, drying)?: None Help from another person to put on and taking off regular upper body clothing?: None Help from another person to put on and taking off regular lower body clothing?: None 6 Click Score: 24   End of Session Equipment Utilized During Treatment: Gait belt Nurse Communication: Mobility status  Activity Tolerance: Patient tolerated treatment well Patient left: in bed;with family/visitor present  OT Visit Diagnosis: Unsteadiness on feet (R26.81)                Time: 2683-4196 OT Time Calculation (min): 20 min Charges:  OT General Charges $OT Visit: 1 Visit OT Evaluation $OT Eval Moderate Complexity: 1 Mod  02/05/2022  RP, OTR/L  Acute Rehabilitation Services  Office:  [phone]   Suzanna Obey 02/05/2022, 4:06 PM

## 2022-02-05 NOTE — H&P (Signed)
Date: 02/05/2022               Patient Name:  Omar Ward MRN: [id number]  DOB: [birth year] Age / Sex: 51 y.o., male   PCP: Luanna Salk, PA-C         Medical Service: Internal Medicine Teaching Service         Attending Physician: Dr. Gust Rung, DO    First Contact: Dr. Modena Slater  Pager: [phone]   Second Contact: Dr. Gwenevere Abbot  Pager: [phone]       After Hours (After 5p/  First Contact Pager: [phone]  weekends / holidays): Second Contact Pager: [phone]   Chief Complaint: dizziness and disequilibrium   History of Present Illness:   Omar Ward is a 51 yo M with a PMH of HLD, HTN, anxiety, alcohol use disorder and tobacco use disorder who presents to the ED with dizziness and disequilibrium.   Patient states that his dizziness and disequilibrium began about a week ago. He does not recall any specific provoking factors but notes that the dizziness would usually subside without intervention. He has taken dramamine once to help with his symptoms. He also notes an associated blurry vision intermittently over the past week, but he feels this is more of a chronic issue and although it was worse recently it feels this may be due to him not obtaining prescription eye wear. He also had an associated HA which he states was primarily in the occiput. He denies any changes any sensation or muscle weakness. He denies chest pain, palpitations or dyspnea. He also denies a history of stroke or heart attack.   Of note, patient states that while shopping he did experience a fall, but fortunately fell on a table with clothing and did not sustain any injuries.   Patient presented to the hospital because he had a difficult time walking, which prevented him from performing his job.   Meds:  Doxepin  ASA 81mg   Lexapro 10mg   HCTZ 25mg  qd Irbesartan 300mg  qd  Rosuvastatin 10mg  qd  Albuterol PRN  Flexeril  Bupropion 300mg  qd Buspar 5mg  TID  Trazodone 50mg  qhs     Allergies: Allergies as of 02/05/2022 - Review Complete 02/05/2022  Allergen Reaction Noted   Amlodipine Other (See Comments) 12/01/2019   Past Medical History:  Diagnosis Date   Dyslipidemia    Hernia, abdominal    Hypertension    PUD (peptic ulcer disease)    Sleep apnea    CPAP    Family History:  Family History  Problem Relation Age of Onset   Diabetes Mother    COPD Mother    Hypertension Father    Diabetes Father    Heart disease Father        Pacemaker   CAD Sister 43       Stent     Social History:  Lives with spouse in [place]. Has two adult children. Works here at . Smokes about a 0.5ppd for over the last 30 years. Used to drink heavily. Last drink ~1 week ago. Used to use cocaine and other hard drugs, but last use was in 30s.   Review of Systems: A complete ROS was negative except as per HPI.   Physical Exam: Blood pressure (!) 130/95, pulse 61, temperature 98.2 F (36.8 C), resp. rate 17, weight 98 kg, SpO2 96 %.  Constitutional: Well-developed, well-nourished, and in no distress.  HENT:  Head: Normocephalic and atraumatic.  Eyes: EOM are normal.  Neck: Normal range of motion.  Cardiovascular: Normal rate, regular rhythm, intact distal pulses. No gallop and no friction rub.  No murmur heard. No lower extremity edema  Pulmonary: Non labored breathing on room air, no wheezing or rales  Abdominal: Soft. Normal bowel sounds. Non distended and non tender Musculoskeletal: Normal range of motion.        General: No tenderness or edema.  Neurological: Alert and oriented to person, place, and time. FTN WNL, BUE/BLE 5/5  Skin: Skin is warm and dry.    EKG: personally reviewed my interpretation is NSR  CT Angio Head Neck W WO CM  Addendum Date: 02/05/2022   ADDENDUM REPORT: 02/05/2022 06:59 ADDENDUM: Study discussed by telephone with Dr. Tilden Fossa on 02/05/2022 at 0641 hours. Electronically Signed   By: Odessa Fleming M.D.   On: 02/05/2022  06:59   Result Date: 02/05/2022 CLINICAL DATA:  51 year old male with severe headache and dizziness for the past week. Vertigo. EXAM: CT ANGIOGRAPHY HEAD AND NECK TECHNIQUE: Multidetector CT imaging of the head and neck was performed using the standard protocol during bolus administration of intravenous contrast. Multiplanar CT image reconstructions and MIPs were obtained to evaluate the vascular anatomy. Carotid stenosis measurements (when applicable) are obtained utilizing NASCET criteria, using the distal internal carotid diameter as the denominator. RADIATION DOSE REDUCTION: This exam was performed according to the departmental dose-optimization program which includes automated exposure control, adjustment of the mA and/or kV according to patient size and/or use of iterative reconstruction technique. CONTRAST:  36mL OMNIPAQUE IOHEXOL 350 MG/ML SOLN COMPARISON:  Head CT 11/08/2014. FINDINGS: CT HEAD FINDINGS Brain: Cerebral volume is within normal limits for age. No midline shift, ventriculomegaly, mass effect, evidence of mass lesion, intracranial hemorrhage. Somewhat indistinct patchy hypodensity in the left cerebellum including the left tonsil is suspicious for recent left PICA infarct. No associated hemorrhage or mass effect. Contralateral much smaller right cerebellar hypodensity also on series 5, image 6 might be chronic but is new since 2016. Vascular: Mild Calcified atherosclerosis at the skull base. Skull: Intact, negative. Sinuses/Orbits: Tympanic cavities are clear. Mastoids are well aerated, stable. Trace paranasal sinus mucosal thickening. No sinus fluid levels. Other: Small retained left forehead metallic foreign body overlying the outer table of the skull was not visible previously. Otherwise negative orbits and scalp. CTA NECK Skeleton: Negative. Upper chest: Mild pulmonary atelectasis. Other neck: Mild postinflammatory tonsillar dystrophic calcifications. Aortic arch: 3 vessel arch  configuration. Mild arch tortuosity. Minimal arch atherosclerosis. Right carotid system: Minimal atherosclerosis at the right carotid bifurcation. Mild tortuosity and no stenosis. Left carotid system: Similar mild tortuosity and minimal atherosclerosis without stenosis. Vertebral arteries: Mild soft plaque in the proximal right subclavian artery with no significant stenosis. Right vertebral artery origin is normal. Right vertebral artery is mildly non dominant but patent and within normal limits to the skull base. No significant proximal left subclavian plaque. Left vertebral artery origin is normal. Tortuous left V1 segment. Dominant left vertebral artery is patent and within normal limits to the skull base. CTA HEAD Posterior circulation: Distal vertebral arteries and vertebrobasilar junction are patent with no significant plaque or stenosis. Left V4 is dominant. The left PICA origin is patent on series 11, image 160, but the left PICA is otherwise highly attenuated, poorly enhancing. Contralateral right PICA origin is normal. But there is moderate to severe distal right PICA irregularity and stenosis on series 16, image 21. Patent basilar artery without stenosis. Normal SCA and PCA origins.  Right posterior communicating artery, the left is diminutive or absent. Bilateral PCA branches are within normal limits. Anterior circulation: Both ICA siphons are patent. No plaque or stenosis on the left. Minimal right siphon calcified plaque in the supraclinoid segment without stenosis. Normal ophthalmic and right posterior communicating artery origins. Patent carotid termini. Normal MCA and ACA origins. Diminutive or absent anterior communicating artery. Bilateral ACA branches are within normal limits. Left MCA M1 segment and trifurcation are patent without stenosis. Right MCA M1 segment and bifurcation are patent without stenosis. Bilateral MCA branches are within normal limits. Venous sinuses: Patent. Anatomic variants:  Mildly dominant right vertebral artery. Review of the MIP images confirms the above findings IMPRESSION: 1. Positive for patchy Acute/Subacute Left PICA Cerebellar Infarcts. No hemorrhagic transformation or significant mass effect. A smaller Right cerebellar infarct might be chronic (see #2). 2. 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 And contralateral Right PICA irregularity with tandem stenoses. But both vertebral arteries remain normal, and remaining posterior circulation is negative. 3. Minimal carotid atherosclerosis. No other significant arterial stenosis. 4. Punctate retained metallic foreign body in the left forehead scalp, new since 2016. Electronically Signed: By: Odessa Fleming M.D. On: 02/05/2022 06:41     Assessment & Plan by Problem: Principal Problem:   CVA (cerebrovascular accident) [facility]) Omar Ward is a 51 yo M with a PMH of HLD, HTN, anxiety, alcohol use disorder and tobacco use disorder who presents to the ED with dizziness and disequilibrium found to have acute/subacute and chronic cerebellar infarcts.   #Cerebellar infarcts #Dizziness/disequilibrium  Patient presented with dizziness and disequilibrium symptoms and was found to have L PICA cerebellar infarcts and a sm R cerebellar infarct that was thought to be chronic. He has risk factors of tobacco use, HLD, and HTN. He had no neuro deficits on exam. Patient also has an associated headache in the occiput that seems to have improved.  -ASA/plavix  -F/u lipid panel, A1c -F/u TTE -F/u PT/OT recommendations  -F/u MRI if patient able to get this   #Anxiety Continue patient's home medications.   #HTN Resume home HCTz and irbesartan.   #HLD Resume home crestor   #H/o alcohol use disorder CIWA without ativan. His last drink was ~1 week ago. He has been hospitalized for his alcohol use when he underwent supervised detox.  -F/u Mg, Phos   #TUD Patch while inpatient.  -TOC for further resources    Dispo: Admit  patient to Observation with expected length of stay less than 2 midnights.  Signed: Marolyn Haller, MD 02/05/2022, 10:10 AM  After 5pm on weekdays and 1pm on weekends: On Call pager: [phone]

## 2022-02-05 NOTE — Progress Notes (Signed)
*  PRELIMINARY RESULTS* Echocardiogram 2D Echocardiogram has been performed.  Carolyne Fiscal 02/05/2022, 3:10 PM

## 2022-02-05 NOTE — Progress Notes (Signed)
VASCULAR LAB    Ultrasound of temporal artery has been performed.  See CV proc for preliminary results.   Leolia Vinzant, RVT 02/05/2022, 10:44 AM

## 2022-02-05 NOTE — Hospital Course (Addendum)
[name] is a 51 yo M with a PMH of HLD, HTN, anxiety, alcohol use disorder and tobacco use disorder who presents to the ED with dizziness and disequilibrium.   Patient states that his dizziness and disequilibrium began about a week ago. He does not recall any specific provoking factors but notes that the dizziness would usually subside without intervention. He has taken dramamine once to help with his symptoms. He also notes an associated blurry vision intermittently over the past week, but he feels this is more of a chronic issue and although it was worse recently it feels this may be due to him not obtaining prescription eye wear. He also had an associated HA which he states was primarily in the occiput. He denies any changes any sensation or muscle weakness. He denies chest pain, palpitations or dyspnea. He also denies a history of stroke or heart attack.   Patient presented to the hospital because he had a difficult time walking, which prevented him from performing his job.

## 2022-02-05 NOTE — ED Triage Notes (Signed)
Pt presents for "dizzy spells"  Pt reports this has been going on for a week.  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  Pt reports he always has to sleep propped up on pillows to prevent dizziness.

## 2022-02-06 DIAGNOSIS — I63542 Cerebral infarction due to unspecified occlusion or stenosis of left cerebellar artery: Secondary | ICD-10-CM

## 2022-02-06 DIAGNOSIS — F17210 Nicotine dependence, cigarettes, uncomplicated: Secondary | ICD-10-CM | POA: Diagnosis not present

## 2022-02-06 DIAGNOSIS — F17200 Nicotine dependence, unspecified, uncomplicated: Secondary | ICD-10-CM

## 2022-02-06 DIAGNOSIS — F1010 Alcohol abuse, uncomplicated: Secondary | ICD-10-CM

## 2022-02-06 DIAGNOSIS — I10 Essential (primary) hypertension: Secondary | ICD-10-CM | POA: Diagnosis not present

## 2022-02-06 DIAGNOSIS — E785 Hyperlipidemia, unspecified: Secondary | ICD-10-CM | POA: Diagnosis not present

## 2022-02-06 DIAGNOSIS — I639 Cerebral infarction, unspecified: Secondary | ICD-10-CM | POA: Diagnosis not present

## 2022-02-06 DIAGNOSIS — Z79899 Other long term (current) drug therapy: Secondary | ICD-10-CM | POA: Diagnosis not present

## 2022-02-06 DIAGNOSIS — Z7982 Long term (current) use of aspirin: Secondary | ICD-10-CM | POA: Diagnosis not present

## 2022-02-06 LAB — VITAMIN B12: Vitamin B-12: 373 pg/mL (ref 180–914)

## 2022-02-06 LAB — FOLATE: Folate: 24.4 ng/mL (ref >5.9)

## 2022-02-06 LAB — CBC
HCT: 43.8 % (ref 39.0–52.0)
Hemoglobin: 14.8 g/dL (ref 13.0–17.0)
MCH: 34.5 pg — ABNORMAL HIGH (ref 26.0–34.0)
MCHC: 33.8 g/dL (ref 30.0–36.0)
MCV: 102.1 fL — ABNORMAL HIGH (ref 80.0–100.0)
Platelets: 154 10*3/uL (ref 150–400)
RBC: 4.29 MIL/uL (ref 4.22–5.81)
RDW: 13.5 % (ref 11.5–15.5)
WBC: 7.6 10*3/uL (ref 4.0–10.5)
nRBC: 0 % (ref 0.0–0.2)

## 2022-02-06 LAB — BASIC METABOLIC PANEL
Anion gap: 11 (ref 5–15)
BUN: 12 mg/dL (ref 6–20)
CO2: 22 mmol/L (ref 22–32)
Calcium: 9.4 mg/dL (ref 8.9–10.3)
Chloride: 107 mmol/L (ref 98–111)
Creatinine, Ser: 0.87 mg/dL (ref 0.61–1.24)
GFR, Estimated: >60 mL/min (ref >60)
Glucose, Bld: 110 mg/dL — ABNORMAL HIGH (ref 70–99)
Potassium: 4 mmol/L (ref 3.5–5.1)
Sodium: 140 mmol/L (ref 135–145)

## 2022-02-06 LAB — RAPID URINE DRUG SCREEN, HOSP PERFORMED
Amphetamines: NOT DETECTED
Barbiturates: NOT DETECTED
Benzodiazepines: NOT DETECTED
Cocaine: NOT DETECTED
Opiates: NOT DETECTED
Tetrahydrocannabinol: POSITIVE — AB

## 2022-02-06 LAB — TSH: TSH: 2.001 u[IU]/mL (ref 0.350–4.500)

## 2022-02-06 LAB — RPR: RPR Ser Ql: NONREACTIVE

## 2022-02-06 MED ORDER — CLOPIDOGREL BISULFATE 75 MG PO TABS
75.0000 mg | ORAL_TABLET | Freq: Every day | ORAL | 0 refills | Status: AC
  Filled 2022-02-06: qty 21, 21d supply, fill #0

## 2022-02-06 MED ORDER — CYCLOBENZAPRINE HCL 10 MG PO TABS
10.0000 mg | ORAL_TABLET | Freq: Every day | ORAL | 0 refills | Status: DC
  Filled 2022-02-06: qty 10, 10d supply, fill #0

## 2022-02-06 MED ORDER — ROSUVASTATIN CALCIUM 40 MG PO TABS
40.0000 mg | ORAL_TABLET | Freq: Every day | ORAL | 0 refills | Status: DC
  Filled 2022-02-06: qty 30, 30d supply, fill #0
  Filled 2022-06-10: qty 3, 3d supply, fill #1

## 2022-02-06 MED ORDER — ASPIRIN 81 MG PO CHEW
81.0000 mg | CHEWABLE_TABLET | Freq: Every day | ORAL | 0 refills | Status: AC
  Filled 2022-02-06 – 2022-02-07 (×2): qty 60, 60d supply, fill #0

## 2022-02-06 MED ORDER — ROSUVASTATIN CALCIUM 20 MG PO TABS: 40.0000 mg | ORAL_TABLET | Freq: Every day | ORAL | Status: DC

## 2022-02-06 MED ORDER — NICOTINE 14 MG/24HR TD PT24
14.0000 mg | MEDICATED_PATCH | Freq: Every day | TRANSDERMAL | 0 refills | Status: DC
  Filled 2022-02-06: qty 28, 28d supply, fill #0

## 2022-02-06 MED ORDER — KETOROLAC TROMETHAMINE 10 MG PO TABS
10.0000 mg | ORAL_TABLET | Freq: Once | ORAL | Status: AC
  Administered 2022-02-06: 10 mg via ORAL
  Filled 2022-02-06: qty 1

## 2022-02-06 MED ORDER — TRAZODONE HCL 50 MG PO TABS
25.0000 mg | ORAL_TABLET | Freq: Every evening | ORAL | 0 refills | Status: DC | PRN
  Filled 2022-02-06: qty 30, 30d supply, fill #0

## 2022-02-06 MED ORDER — ROSUVASTATIN CALCIUM 20 MG PO TABS: 20.0000 mg | ORAL_TABLET | Freq: Every day | ORAL | Status: DC

## 2022-02-06 NOTE — Evaluation (Signed)
Physical Therapy Evaluation and Discharge Patient Details Name: Omar Ward MRN: [id number] DOB: 1970-03-03 Today's Date: 02/06/2022  History of Present Illness  51 yo M with a PMH of HLD, HTN, anxiety, alcohol use disorder and tobacco use disorder who presents to the ED with dizziness and disequilibrium.  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  Clinical Impression   Patient evaluated by Physical Therapy with no further acute PT needs identified. All education has been completed and the patient has no further questions. Patient scored 24/24 on Dynamic Gait Index and had no imbalance throughout session. PT is signing off. Thank you for this referral.        Recommendations for follow up therapy are one component of a multi-disciplinary discharge planning process, led by the attending physician.  Recommendations may be updated based on patient status, additional functional criteria and insurance authorization.  Follow Up Recommendations No PT follow up      Assistance Recommended at Discharge None  Patient can return home with the following       Equipment Recommendations None recommended by PT  Recommendations for Other Services       Functional Status Assessment Patient has not had a recent decline in their functional status     Precautions / Restrictions Precautions Precautions: Fall Restrictions Weight Bearing Restrictions: No      Mobility  Bed Mobility Overal bed mobility: Independent                  Transfers Overall transfer level: Independent Equipment used: None Transfers: Sit to/from Stand Sit to Stand: Independent                Ambulation/Gait Ambulation/Gait assistance: Independent Gait Distance (Feet): 200 Feet Assistive device: None Gait Pattern/deviations: WFL(Within Functional Limits)   Gait velocity interpretation: >2.62 ft/sec, indicative of community ambulatory   General Gait Details: see  DGI  Stairs            Wheelchair Mobility    Modified Rankin (Stroke Patients Only) Modified Rankin (Stroke Patients Only) Pre-Morbid Rankin Score: No symptoms Modified Rankin: No symptoms     Balance Overall balance assessment: Independent                               Standardized Balance Assessment Standardized Balance Assessment : Dynamic Gait Index   Dynamic Gait Index Level Surface: Normal Change in Gait Speed: Normal Gait with Horizontal Head Turns: Normal Gait with Vertical Head Turns: Normal Gait and Pivot Turn: Normal Step Over Obstacle: Normal Step Around Obstacles: Normal Steps: Normal Total Score: 24       Pertinent Vitals/Pain Pain Assessment Pain Assessment: No/denies pain    Home Living Family/patient expects to be discharged to:: Private residence Living Arrangements: Spouse/significant other Available Help at Discharge: Family;Available 24 hours/day Type of Home: House Home Access: Level entry       Home Layout: One level Home Equipment: None      Prior Function Prior Level of Function : Independent/Modified Independent;Working/employed;Driving             Mobility Comments: works in Production designer, theatre/television/film at [employer]   Dominant Hand: Right    Extremity/Trunk Assessment   Upper Extremity Assessment Upper Extremity Assessment: Overall WFL for tasks assessed    Lower Extremity Assessment Lower Extremity Assessment: Overall WFL for tasks assessed    Cervical / Trunk  Assessment Cervical / Trunk Assessment: Normal  Communication   Communication: No difficulties  Cognition Arousal/Alertness: Awake/alert Behavior During Therapy: WFL for tasks assessed/performed Overall Cognitive Status: Within Functional Limits for tasks assessed                                          General Comments      Exercises     Assessment/Plan    PT Assessment Patient does not need any further PT  services  PT Problem List         PT Treatment Interventions      PT Goals (Current goals can be found in the Care Plan section)  Acute Rehab PT Goals Patient Stated Goal: go home today and return to work PT Goal Formulation: All assessment and education complete, DC therapy    Frequency       Co-evaluation               AM-PAC PT "6 Clicks" Mobility  Outcome Measure Help needed turning from your back to your side while in a flat bed without using bedrails?: None Help needed moving from lying on your back to sitting on the side of a flat bed without using bedrails?: None Help needed moving to and from a bed to a chair (including a wheelchair)?: None Help needed standing up from a chair using your arms (e.g., wheelchair or bedside chair)?: None Help needed to walk in hospital room?: None Help needed climbing 3-5 steps with a railing? : None 6 Click Score: 24    End of Session Equipment Utilized During Treatment: Gait belt Activity Tolerance: Patient tolerated treatment well Patient left: in chair;with call bell/phone within reach;with chair alarm set Nurse Communication: Mobility status;Other (comment) (no PT or DME needs) PT Visit Diagnosis: Other symptoms and signs involving the nervous system (E83.151)    Time: 7616-0737 PT Time Calculation (min) (ACUTE ONLY): 10 min   Charges:   PT Evaluation $PT Eval Low Complexity: 1 Low           Jerolyn Center, PT Acute Rehabilitation Services  Office [phone]   Zena Amos 02/06/2022, 10:14 AM

## 2022-02-06 NOTE — Discharge Summary (Addendum)
Name: Omar Ward MRN: [id number] DOB: 12-07-1970 51 y.o. PCP: Omar Sabal, PA-C  Date of Admission: 02/05/2022  1:42 AM Date of Discharge: 02/06/2022 Attending Physician: Omar Groves, DO  Discharge Diagnosis: Principal Problem:   CVA (cerebrovascular accident) [facility]) Active Problems:   Essential hypertension   Hyperlipidemia Alcohol use disorder Tobacco use disorder  Discharge Medications: Allergies as of 02/06/2022       Reactions   Norvasc [amlodipine] Other (See Comments)   Lower extremity swelling        Medication List     STOP taking these medications    ibuprofen 200 MG tablet Commonly known as: ADVIL       TAKE these medications    acetaminophen 500 MG tablet Commonly known as: TYLENOL Take 1,000 mg by mouth daily as needed for headache (pain).   albuterol 108 (90 Base) MCG/ACT inhaler Commonly known as: VENTOLIN HFA Inhale 2 puffs into the lungs every 6 (six) hours as needed for wheezing   aspirin 81 MG chewable tablet Chew 1 tablet (81 mg total) by mouth daily. Start taking on: February 07, 2022   buPROPion 300 MG 24 hr tablet Commonly known as: WELLBUTRIN XL Take 1 tablet (300 mg total) by mouth daily.   busPIRone 5 MG tablet Commonly known as: BUSPAR Take 1 tablet (5 mg total) by mouth 3 (three) times daily. What changed: when to take this   clopidogrel 75 MG tablet Commonly known as: PLAVIX Take 1 tablet (75 mg total) by mouth daily for 21 days. Start taking on: February 07, 2022   cyclobenzaprine 10 MG tablet Commonly known as: FLEXERIL Take 1 tablet (10 mg total) by mouth at bedtime.   doxepin 10 MG capsule Commonly known as: SINEQUAN Take 2 capsules (20 mg total) by mouth nightly What changed:  how much to take when to take this reasons to take this   escitalopram 10 MG tablet Commonly known as: LEXAPRO Take 1 tablet (10 mg total) by mouth Once Daily.   esomeprazole 20 MG capsule Commonly known as:  NEXIUM Take 1 capsule (20 mg total) by mouth daily as needed (For heartburn or acid reflux).   GOODY HEADACHE PO Take 2 packets by mouth daily as needed (headache, pain).   hydrochlorothiazide 25 MG tablet Commonly known as: HYDRODIURIL Take 1 tablet (25 mg total) by mouth daily.   irbesartan 300 MG tablet Commonly known as: AVAPRO Take 1 tablet (300 mg total) by mouth daily.   MAGNESIUM PO Take 1 capsule by mouth daily.   multivitamin with minerals Tabs tablet Take 1 tablet by mouth daily.   nicotine 14 mg/24hr patch Commonly known as: NICODERM CQ - dosed in mg/24 hours Place 1 patch (14 mg total) onto the skin daily. Start taking on: February 07, 2022   nitroGLYCERIN 0.4 MG SL tablet Commonly known as: NITROSTAT PLACE 1 TABLET UNDER THE TONGUE EVERY 5 MINUTES AS NEEDED FOR CHEST PAIN. What changed:  how much to take when to take this reasons to take this   polyethylene glycol-electrolytes 420 g solution Commonly known as: NuLYTELY Take as directed by the office.   rosuvastatin 40 MG tablet Commonly known as: CRESTOR Take 1 tablet (40 mg total) by mouth daily. Start taking on: February 07, 2022 What changed:  medication strength how much to take   traZODone 50 MG tablet Commonly known as: DESYREL TAKE 0.5-1 TABLETS (25-50 MG TOTAL) BY MOUTH AT BEDTIME AS NEEDED FOR SLEEP. What changed:  how  much to take how to take this when to take this   VISINE OP Place 1 drop into both eyes daily as needed (dry eyes, itchy eyes).        Disposition and follow-up:   Omar Ward was discharged from [facility] in Stable condition.  At the hospital follow up visit please address:  1.  CVA: Ensure pt is complaint with antiplatelet therapy and follow up with neurology.   2.  Labs / imaging needed at time of follow-up: None  3.  Pending labs/ test needing follow-up: A1c  Follow-up Appointments:  Follow-up Information     [facility]. Schedule an appointment as soon as possible for a visit in 1 month(s).   Specialty: Neurology Why: stroke clinic Contact information: [address] [place] [phone]        Omar Ward, [place]. Call in 1 day(s).   Specialty: Physician Assistant Why: Call tomorrow to make a hospital follow up in 7-10 days. Contact information: [place] [address] [phone]                 Hospital Course by problem list: HPI per Dr. Eulas Ward "Omar Ward is a 51 yo M with a PMH of HLD, HTN, anxiety, alcohol use disorder and tobacco use disorder who presents to the ED with dizziness and disequilibrium.    Patient states that his dizziness and disequilibrium began about a week ago. He does not recall any specific provoking factors but notes that the dizziness would usually subside without intervention. He has taken dramamine once to help with his symptoms. He also notes an associated blurry vision intermittently over the past week, but he feels this is more of a chronic issue and although it was worse recently it feels this may be due to him not obtaining prescription eye wear. He also had an associated HA which he states was primarily in the occiput. He denies any changes any sensation or muscle weakness. He denies chest pain, palpitations or dyspnea. He also denies a history of stroke or heart attack.    Of note, patient states that while shopping he did experience a fall, but fortunately fell on a table with clothing and did not sustain any injuries.    Patient presented to the hospital because he had a difficult time walking, which prevented him from performing his job."  #Cerebellar Infarcts Patient presented with dizziness and disequilibrium symptoms and was found to have L PICA cerebellar infarcts and a sm R cerebellar infarct that was thought to be chronic. MRI shows left cerebellar infarct as being acute and right  being chronic. Neurology was consulted and recommended routine testing including lipid panel, A1c, echo, CTA of head and neck and MRI (findings listed below). They recommended aspirin and plavix for 21 days and then aspirin after that. Plan for follow up after discharge by [facility]. Plan is to optimize pt's to prevent further vascular events. Pt had a headache and GCA was suspected but ruled out with normal ESR, CRP and normal temporal artery ultrasound.    #Anxiety Chronic. Home meds were continued.   #HTN Chronic. Home meds held given acute nature of stroke on MRI. Advised pt to restart medicines tomorrow  #HLD Crestor increased to 40 mg given LDL >70.   #Alcohol use disorder #Tobacco use disorder Advised pt to stop smoking. Pt stated he will work with his wife so they quit  together. Pt was discharged with nicotine patch.   Discharge Subjective: No acute complaints.  Discharge Exam:   BP (!) 139/92 (BP Location: Left Arm)   Pulse 70   Temp 99.1 F (37.3 C) (Oral)   Resp 17   Wt 98 kg   SpO2 95%   BMI 31.91 kg/m  Physical Exam:  Gen: Comfortable sitting in recliner HENT: NCAT Chest: normal work of breathing, regular rate and rhythm Neuro: CNII-XII intact, BUE and BLE with normal sensation and strength, finger to nose testing intact, rapidly alternating hand movement normal  Pertinent Labs, Studies, and Procedures:  ECHOCARDIOGRAM COMPLETE BUBBLE STUDY  Result Date: 02/05/2022 IMPRESSIONS  1. Left ventricular ejection fraction, by estimation, is 60 to 65%. The left ventricle has normal function. The left ventricle has no regional wall motion abnormalities. There is moderate concentric left ventricular hypertrophy. Left ventricular diastolic parameters are consistent with Grade I diastolic dysfunction (impaired relaxation).  2. Right ventricular systolic function is normal. The right ventricular size is normal. Tricuspid regurgitation signal is inadequate for  assessing PA pressure.  3. The mitral valve is normal in structure. No evidence of mitral valve regurgitation. No evidence of mitral stenosis.  4. The aortic valve is normal in structure. Aortic valve regurgitation is not visualized. No aortic stenosis is present.  5. The inferior vena cava is normal in size with greater than 50% respiratory variability, suggesting right atrial pressure of 3 mmHg. Conclusion(s)/Recommendation(s): No intracardiac source of embolism detected on this transthoracic study. Consider a transesophageal echocardiogram to exclude cardiac source of embolism if clinically indicated.  MR BRAIN W WO CONTRAST  Result Date: 02/05/2022 IMPRESSION: 1. Patchy acute infarctions in the inferior cerebellum on the left, PICA distribution. No mass effect or hemorrhage. 2. Old small vessel infarction in the right cerebellum. Minimal old small vessel infarction in the white matter adjacent to the frontal horn of the left lateral ventricle. Electronically Signed   By: Nelson Chimes M.D.   On: 02/05/2022 13:39   TEMPORAL ARTERY  Result Date: 02/05/2022  TEMPORAL ARTERY REPORT Patient Name:  MAXIMILIEN HAYASHI  Date of Exam:   02/05/2022 Medical Rec #: [id number]       Accession #:    [id number] Date of Birth: 09-Apr-1970        Patient Gender: M Patient Age:   3 years Exam Location:  [facility] Procedure:      VAS [place] TEMPORAL ARTERY BILATERAL Referring Phys: Elwin Sleight DE LA TORRE --------------------------------------------------------------------------------  Indications: Headache and scalp tenderness. High Risk Factors: Age > 50 yrs.  Comparison Study: No prior study Performing Technologist: Sharion Dove RVS Supporting Technologist: Darlin Coco RDMS, RVT  Examination Guidelines: Patient in reclined position. 2D, color and spectral doppler sampling in the temporal artery along the hairline and temple in the longitudinal plane. 2D images along the hairline and temple in the transverse plane. Exam is  bilateral.  Summary: Absence of a "halo" sign in the bilateral temporal artery, although not definitive, makes a diagnosis of temporal arteritis unlikely.  *See table(s) above for measurements and observations.    Preliminary    DG FEMUR 1V LEFT  Result Date: 02/05/2022 CLINICAL DATA:  Metal screen prior to MRI. EXAM: LEFT FEMUR 1 VIEW COMPARISON:  None Available. FINDINGS: Normal appearance. No sign of metallic foreign object. No pathologic finding. IMPRESSION: Normal. No sign of metallic foreign object. Electronically Signed   By: Nelson Chimes M.D.   On: 02/05/2022 11:33   DG Pelvis 1-2 Views  Result Date: 02/05/2022 CLINICAL DATA:  Metal screen prior to MRI. EXAM: PELVIS - 1-2 VIEW COMPARISON:  None Available. FINDINGS: Iodinated contrast fills the bladder. No evidence regional metallic entity. No abnormal bone or joint finding. IMPRESSION: Iodinated contrast fills the bladder. No evidence of regional metallic entity. Electronically Signed   By: Nelson Chimes M.D.   On: 02/05/2022 11:33   CT Angio Head Neck W WO CM  Addendum Date: 02/05/2022   ADDENDUM REPORT: 02/05/2022 06:59 ADDENDUM: Study discussed by telephone with Dr. Quintella Reichert on 02/05/2022 at 0641 hours. Electronically Signed   By: Genevie Ann M.D.   On: 02/05/2022 06:59   Result Date: 02/05/2022 IMPRESSION: 1. Positive for patchy Acute/Subacute Left PICA Cerebellar Infarcts. No hemorrhagic transformation or significant mass effect. A smaller Right cerebellar infarct might be chronic (see #2). 2. 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 And contralateral Right PICA irregularity with tandem stenoses. But both vertebral arteries remain normal, and remaining posterior circulation is negative. 3. Minimal carotid atherosclerosis. No other significant arterial stenosis. 4. Punctate retained metallic foreign body in the left forehead scalp, new since 2016. Electronically Signed: By: Genevie Ann M.D. On: 02/05/2022 06:41     Discharge  Instructions: Discharge Instructions     Ambulatory referral to Neurology   Complete by: As directed    Follow up with stroke clinic NP (Jessica Vanschaick or Cecille Rubin, if both not available, consider Zachery Dauer, or Ahern) at [facility] in about 4 weeks. Thanks.   Call MD for:  difficulty breathing, headache or visual disturbances   Complete by: As directed    Call MD for:  difficulty breathing, headache or visual disturbances   Complete by: As directed    Call MD for:  extreme fatigue   Complete by: As directed    Call MD for:  extreme fatigue   Complete by: As directed    Call MD for:  hives   Complete by: As directed    Call MD for:  hives   Complete by: As directed    Call MD for:  persistant dizziness or light-headedness   Complete by: As directed    Call MD for:  persistant dizziness or light-headedness   Complete by: As directed    Call MD for:  persistant nausea and vomiting   Complete by: As directed    Call MD for:  persistant nausea and vomiting   Complete by: As directed    Call MD for:  redness, tenderness, or signs of infection (pain, swelling, redness, odor or green/yellow discharge around incision site)   Complete by: As directed    Call MD for:  redness, tenderness, or signs of infection (pain, swelling, redness, odor or green/yellow discharge around incision site)   Complete by: As directed    Call MD for:  severe uncontrolled pain   Complete by: As directed    Call MD for:  severe uncontrolled pain   Complete by: As directed    Call MD for:  temperature >100.4   Complete by: As directed    Call MD for:  temperature >100.4   Complete by: As directed    Diet - low sodium heart healthy   Complete by: As directed    Diet - low sodium heart healthy   Complete by: As directed    Discharge instructions   Complete by: As directed    It was a pleasure taking care of you! You had a stroke and we started some new  medications. Please take them every day. You  will take both aspirin and plavix for 21 days and then aspirin only. Restart your blood pressure medicines starting tomorrow. Please follow up with your PCP and [facility]. The neurology office will call you to schedule an appointment.   Discharge instructions   Complete by: As directed    Please continue taking Aspirin and Plavix for 21 days and then take Aspirin only. Follow up with your PCP and neurology office. They will call you with an appointment. Restart your blood pressure medications tomorrow.   Increase activity slowly   Complete by: As directed    Increase activity slowly   Complete by: As directed        Signed: Idamae Schuller, MD Tillie Rung. [facility], PGY-2 02/06/2022, 4:15 PM   Pager: [phone]

## 2022-02-06 NOTE — Plan of Care (Signed)

## 2022-02-06 NOTE — Plan of Care (Signed)
  Problem: Pain Managment: Goal: General experience of comfort will improve 02/06/2022 0621 by Joanne Chars, RN Outcome: Progressing 02/06/2022 0620 by Joanne Chars, RN Outcome: Progressing   Problem: Safety: Goal: Ability to remain free from injury will improve 02/06/2022 0621 by Joanne Chars, RN Outcome: Progressing 02/06/2022 0620 by Joanne Chars, RN Outcome: Progressing   Problem: Skin Integrity: Goal: Risk for impaired skin integrity will decrease 02/06/2022 0621 by Joanne Chars, RN Outcome: Progressing 02/06/2022 0620 by Joanne Chars, RN Outcome: Progressing   Problem: Ischemic Stroke/TIA Tissue Perfusion: Goal: Complications of ischemic stroke/TIA will be minimized Outcome: Progressing   Problem: Health Behavior/Discharge Planning: Goal: Ability to manage health-related needs will improve Outcome: Progressing Goal: Goals will be collaboratively established with patient/family Outcome: Progressing   Problem: Nutrition: Goal: Risk of aspiration will decrease Outcome: Progressing Goal: Dietary intake will improve Outcome: Progressing

## 2022-02-06 NOTE — Progress Notes (Signed)
STROKE TEAM PROGRESS NOTE   SUBJECTIVE (INTERVAL HISTORY) No family is at the bedside.  Overall his condition is completely resolved. He is eager to go home. Educated on stroke risk factor modification, smoking cessation and alcohol limitation.    OBJECTIVE Temp:  [97.8 F (36.6 C)-99.1 F (37.3 C)] 99.1 F (37.3 C) (12/10 1129) Pulse Rate:  [53-80] 70 (12/10 1129) Cardiac Rhythm: Normal sinus rhythm (12/10 0800) Resp:  [10-18] 17 (12/10 1129) BP: (103-146)/(64-96) 139/92 (12/10 1129) SpO2:  [92 %-99 %] 95 % (12/10 1129)  No results for input(s): "GLUCAP" in the last 168 hours. Recent Labs  Lab 02/05/22 0200 02/05/22 0241 02/05/22 0909 02/06/22 0359  NA 135 136  --  140  K 3.7 4.4  --  4.0  CL 103 102  --  107  CO2 20*  --   --  22  GLUCOSE 117* 112*  --  110*  BUN 20 29*  --  12  CREATININE 0.92 0.90  --  0.87  CALCIUM 8.9  --   --  9.4  MG  --   --  1.9  --   PHOS  --   --  2.7  --    Recent Labs  Lab 02/05/22 0200  AST 27  ALT 35  ALKPHOS 49  BILITOT 0.6  PROT 6.7  ALBUMIN 3.8   Recent Labs  Lab 02/05/22 0200 02/05/22 0241 02/06/22 0359  WBC 9.4  --  7.6  NEUTROABS 5.7  --   --   HGB 14.6 15.3 14.8  HCT 43.7 45.0 43.8  MCV 101.6*  --  102.1*  PLT 186  --  154   No results for input(s): "CKTOTAL", "CKMB", "CKMBINDEX", "TROPONINI" in the last 168 hours. Recent Labs    02/05/22 0200  LABPROT 13.0  INR 1.0   No results for input(s): "COLORURINE", "LABSPEC", "PHURINE", "GLUCOSEU", "HGBUR", "BILIRUBINUR", "KETONESUR", "PROTEINUR", "UROBILINOGEN", "NITRITE", "LEUKOCYTESUR" in the last 72 hours.  Invalid input(s): "APPERANCEUR"     Component Value Date/Time   CHOL 166 02/05/2022 0909   CHOL 255 (H) 10/31/2018 1155   TRIG 200 (H) 02/05/2022 0909   HDL 39 (L) 02/05/2022 0909   HDL 43 10/31/2018 1155   CHOLHDL 4.3 02/05/2022 0909   VLDL 40 02/05/2022 0909   LDLCALC 87 02/05/2022 0909   LDLCALC 150 (H) 10/31/2018 1155   Lab Results  Component  Value Date   HGBA1C 5.6 09/21/2021      Component Value Date/Time   LABOPIA NONE DETECTED 02/06/2022 0840   COCAINSCRNUR NONE DETECTED 02/06/2022 0840   LABBENZ NONE DETECTED 02/06/2022 0840   AMPHETMU NONE DETECTED 02/06/2022 0840   THCU POSITIVE (A) 02/06/2022 0840   LABBARB NONE DETECTED 02/06/2022 0840    Recent Labs  Lab 02/05/22 0200  [place] <10    I have personally reviewed the radiological images below and agree with the radiology interpretations.  ECHOCARDIOGRAM COMPLETE BUBBLE STUDY  Result Date: 02/05/2022    ECHOCARDIOGRAM REPORT   Patient Name:   Omar Ward Date of Exam: 02/05/2022 Medical Rec #:  [id number]      Height:       69.0 in Accession #:    [id number]     Weight:       216.0 lb Date of Birth:  1970/11/14       BSA:          2.135 m Patient Age:    51 years       BP:  141/83 mmHg Patient Gender: M              HR:           67 bpm. Exam Location:  Inpatient Procedure: 2D Echo, Cardiac Doppler, Color Doppler and Saline Contrast Bubble            Study Indications:    Stroke  History:        Patient has no prior history of Echocardiogram examinations.                 Stroke, Signs/Symptoms:Chest Pain; Risk Factors:Hypertension,                 Current Smoker and Dyslipidemia.  Sonographer:    Wenda Low Referring Phys: [id number] [place]  1. Left ventricular ejection fraction, by estimation, is 60 to 65%. The left ventricle has normal function. The left ventricle has no regional wall motion abnormalities. There is moderate concentric left ventricular hypertrophy. Left ventricular diastolic parameters are consistent with Grade I diastolic dysfunction (impaired relaxation).  2. Right ventricular systolic function is normal. The right ventricular size is normal. Tricuspid regurgitation signal is inadequate for assessing PA pressure.  3. The mitral valve is normal in structure. No evidence of mitral valve regurgitation. No evidence of mitral  stenosis.  4. The aortic valve is normal in structure. Aortic valve regurgitation is not visualized. No aortic stenosis is present.  5. The inferior vena cava is normal in size with greater than 50% respiratory variability, suggesting right atrial pressure of 3 mmHg. Conclusion(s)/Recommendation(s): No intracardiac source of embolism detected on this transthoracic study. Consider a transesophageal echocardiogram to exclude cardiac source of embolism if clinically indicated. FINDINGS  Left Ventricle: Left ventricular ejection fraction, by estimation, is 60 to 65%. The left ventricle has normal function. The left ventricle has no regional wall motion abnormalities. The left ventricular internal cavity size was normal in size. There is  moderate concentric left ventricular hypertrophy. Left ventricular diastolic parameters are consistent with Grade I diastolic dysfunction (impaired relaxation). Normal left ventricular filling pressure. Right Ventricle: The right ventricular size is normal. No increase in right ventricular wall thickness. Right ventricular systolic function is normal. Tricuspid regurgitation signal is inadequate for assessing PA pressure. Left Atrium: Left atrial size was normal in size. Right Atrium: Right atrial size was normal in size. Pericardium: There is no evidence of pericardial effusion. Mitral Valve: The mitral valve is normal in structure. No evidence of mitral valve regurgitation. No evidence of mitral valve stenosis. MV peak gradient, 4.3 mmHg. The mean mitral valve gradient is 1.0 mmHg. Tricuspid Valve: The tricuspid valve is normal in structure. Tricuspid valve regurgitation is not demonstrated. No evidence of tricuspid stenosis. Aortic Valve: The aortic valve is normal in structure. Aortic valve regurgitation is not visualized. No aortic stenosis is present. Aortic valve mean gradient measures 6.0 mmHg. Aortic valve peak gradient measures 13.0 mmHg. Aortic valve area, by VTI measures  2.81 cm. Pulmonic Valve: The pulmonic valve was normal in structure. Pulmonic valve regurgitation is not visualized. No evidence of pulmonic stenosis. Aorta: The aortic root is normal in size and structure. Venous: The inferior vena cava is normal in size with greater than 50% respiratory variability, suggesting right atrial pressure of 3 mmHg. IAS/Shunts: No atrial level shunt detected by color flow Doppler. Agitated saline contrast was given intravenously to evaluate for intracardiac shunting.  LEFT VENTRICLE PLAX 2D LVIDd:  4.50 cm   Diastology LVIDs:         2.20 cm   LV e' medial:    6.64 cm/s LV PW:         1.20 cm   LV E/e' medial:  8.3 LV IVS:        1.40 cm   LV e' lateral:   8.27 cm/s LVOT diam:     2.10 cm   LV E/e' lateral: 6.6 LV SV:         114 LV SV Index:   53 LVOT Area:     3.46 cm  RIGHT VENTRICLE RV Basal diam:  3.87 cm RV Mid diam:    3.10 cm RV S prime:     17.30 cm/s TAPSE (M-mode): 3.1 cm LEFT ATRIUM             Index        RIGHT ATRIUM           Index LA diam:        3.80 cm 1.78 cm/m   RA Area:     16.80 cm LA Vol (A2C):   55.0 ml 25.76 ml/m  RA Volume:   47.50 ml  22.25 ml/m LA Vol (A4C):   42.5 ml 19.91 ml/m LA Biplane Vol: 49.1 ml 23.00 ml/m  AORTIC VALVE                     PULMONIC VALVE AV Area (Vmax):    3.06 cm      PV Vmax:       1.21 m/s AV Area (Vmean):   3.07 cm      PV Peak grad:  5.9 mmHg AV Area (VTI):     2.81 cm AV Vmax:           180.00 cm/s AV Vmean:          114.000 cm/s AV VTI:            0.405 m AV Peak Grad:      13.0 mmHg AV Mean Grad:      6.0 mmHg LVOT Vmax:         159.00 cm/s LVOT Vmean:        101.000 cm/s LVOT VTI:          0.329 m LVOT/AV VTI ratio: 0.81  AORTA Ao Root diam: 3.40 cm MITRAL VALVE MV Area (PHT): 2.38 cm    SHUNTS MV Area VTI:   3.97 cm    Systemic VTI:  0.33 m MV Peak grad:  4.3 mmHg    Systemic Diam: 2.10 cm MV Mean grad:  1.0 mmHg MV Vmax:       1.04 m/s MV Vmean:      50.5 cm/s MV Decel Time: 319 msec MV E velocity: 54.80  cm/s MV A velocity: 82.30 cm/s MV E/A ratio:  0.67 Fransico Him MD Electronically signed by Fransico Him MD Signature Date/Time: 02/05/2022/3:16:17 PM    Final    MR BRAIN W WO CONTRAST  Result Date: 02/05/2022 CLINICAL DATA:  Stroke.  Follow-up.  Headache new onset.  Somnolent. EXAM: MRI HEAD WITHOUT AND WITH CONTRAST TECHNIQUE: Multiplanar, multiecho pulse sequences of the brain and surrounding structures were obtained without and with intravenous contrast. CONTRAST:  82m GADAVIST GADOBUTROL 1 MMOL/ML IV SOLN COMPARISON:  CT studies same day FINDINGS: Brain: Diffusion imaging shows patchy acute infarctions within the inferior cerebellum on the left, PICA distribution. No involvement of the medulla. No  other acute infarction. Old small vessel infarction in the right cerebellum. Minimal old small vessel infarction in the white matter adjacent to the frontal horn of the left lateral ventricle. Cerebral hemispheres are vies otherwise normal without other small or large vessel insult. No mass, hemorrhage, hydrocephalus or extra-axial collection. After contrast administration, no abnormal enhancement occurs. Vascular: Major vessels at the base of the brain show flow. Skull and upper cervical spine: Negative Sinuses/Orbits: Clear/normal Other: None IMPRESSION: 1. Patchy acute infarctions in the inferior cerebellum on the left, PICA distribution. No mass effect or hemorrhage. 2. Old small vessel infarction in the right cerebellum. Minimal old small vessel infarction in the white matter adjacent to the frontal horn of the left lateral ventricle. Electronically Signed   By: Nelson Chimes M.D.   On: 02/05/2022 13:39   TEMPORAL ARTERY  Result Date: 02/05/2022  TEMPORAL ARTERY REPORT Patient Name:  Omar Ward  Date of Exam:   02/05/2022 Medical Rec #: [id number]       Accession #:    [id number] Date of Birth: 1971/01/07        Patient Gender: M Patient Age:   53 years Exam Location:  [facility] Procedure:       VAS [place] TEMPORAL ARTERY BILATERAL Referring Phys: Elwin Sleight [place] --------------------------------------------------------------------------------  Indications: Headache and scalp tenderness. High Risk Factors: Age > 50 yrs.  Comparison Study: No prior study Performing Technologist: Sharion Dove RVS Supporting Technologist: Darlin Coco RDMS, RVT  Examination Guidelines: Patient in reclined position. 2D, color and spectral doppler sampling in the temporal artery along the hairline and temple in the longitudinal plane. 2D images along the hairline and temple in the transverse plane. Exam is bilateral.  Summary: Absence of a "halo" sign in the bilateral temporal artery, although not definitive, makes a diagnosis of temporal arteritis unlikely.  *See table(s) above for measurements and observations.    Preliminary    DG FEMUR 1V LEFT  Result Date: 02/05/2022 CLINICAL DATA:  Metal screen prior to MRI. EXAM: LEFT FEMUR 1 VIEW COMPARISON:  None Available. FINDINGS: Normal appearance. No sign of metallic foreign object. No pathologic finding. IMPRESSION: Normal. No sign of metallic foreign object. Electronically Signed   By: Nelson Chimes M.D.   On: 02/05/2022 11:33   DG Pelvis 1-2 Views  Result Date: 02/05/2022 CLINICAL DATA:  Metal screen prior to MRI. EXAM: PELVIS - 1-2 VIEW COMPARISON:  None Available. FINDINGS: Iodinated contrast fills the bladder. No evidence regional metallic entity. No abnormal bone or joint finding. IMPRESSION: Iodinated contrast fills the bladder. No evidence of regional metallic entity. Electronically Signed   By: Nelson Chimes M.D.   On: 02/05/2022 11:33   CT Angio Head Neck W WO CM  Addendum Date: 02/05/2022   ADDENDUM REPORT: 02/05/2022 06:59 ADDENDUM: Study discussed by telephone with Dr. Quintella Reichert on 02/05/2022 at 0641 hours. Electronically Signed   By: Genevie Ann M.D.   On: 02/05/2022 06:59   Result Date: 02/05/2022 CLINICAL DATA:  51 year old male with severe headache  and dizziness for the past week. Vertigo. EXAM: CT ANGIOGRAPHY HEAD AND NECK TECHNIQUE: Multidetector CT imaging of the head and neck was performed using the standard protocol during bolus administration of intravenous contrast. Multiplanar CT image reconstructions and MIPs were obtained to evaluate the vascular anatomy. Carotid stenosis measurements (when applicable) are obtained utilizing NASCET criteria, using the distal internal carotid diameter as the denominator. RADIATION DOSE REDUCTION: This exam was performed according to the departmental dose-optimization  program which includes automated exposure control, adjustment of the mA and/or kV according to patient size and/or use of iterative reconstruction technique. CONTRAST:  71m OMNIPAQUE IOHEXOL 350 MG/ML SOLN COMPARISON:  Head CT 11/08/2014. FINDINGS: CT HEAD FINDINGS Brain: Cerebral volume is within normal limits for age. No midline shift, ventriculomegaly, mass effect, evidence of mass lesion, intracranial hemorrhage. Somewhat indistinct patchy hypodensity in the left cerebellum including the left tonsil is suspicious for recent left PICA infarct. No associated hemorrhage or mass effect. Contralateral much smaller right cerebellar hypodensity also on series 5, image 6 might be chronic but is new since 2016. Vascular: Mild Calcified atherosclerosis at the skull base. Skull: Intact, negative. Sinuses/Orbits: Tympanic cavities are clear. Mastoids are well aerated, stable. Trace paranasal sinus mucosal thickening. No sinus fluid levels. Other: Small retained left forehead metallic foreign body overlying the outer table of the skull was not visible previously. Otherwise negative orbits and scalp. CTA NECK Skeleton: Negative. Upper chest: Mild pulmonary atelectasis. Other neck: Mild postinflammatory tonsillar dystrophic calcifications. Aortic arch: 3 vessel arch configuration. Mild arch tortuosity. Minimal arch atherosclerosis. Right carotid system: Minimal  atherosclerosis at the right carotid bifurcation. Mild tortuosity and no stenosis. Left carotid system: Similar mild tortuosity and minimal atherosclerosis without stenosis. Vertebral arteries: Mild soft plaque in the proximal right subclavian artery with no significant stenosis. Right vertebral artery origin is normal. Right vertebral artery is mildly non dominant but patent and within normal limits to the skull base. No significant proximal left subclavian plaque. Left vertebral artery origin is normal. Tortuous left V1 segment. Dominant left vertebral artery is patent and within normal limits to the skull base. CTA HEAD Posterior circulation: Distal vertebral arteries and vertebrobasilar junction are patent with no significant plaque or stenosis. Left V4 is dominant. The left PICA origin is patent on series 11, image 160, but the left PICA is otherwise highly attenuated, poorly enhancing. Contralateral right PICA origin is normal. But there is moderate to severe distal right PICA irregularity and stenosis on series 16, image 21. Patent basilar artery without stenosis. Normal SCA and PCA origins. Right posterior communicating artery, the left is diminutive or absent. Bilateral PCA branches are within normal limits. Anterior circulation: Both ICA siphons are patent. No plaque or stenosis on the left. Minimal right siphon calcified plaque in the supraclinoid segment without stenosis. Normal ophthalmic and right posterior communicating artery origins. Patent carotid termini. Normal MCA and ACA origins. Diminutive or absent anterior communicating artery. Bilateral ACA branches are within normal limits. Left MCA M1 segment and trifurcation are patent without stenosis. Right MCA M1 segment and bifurcation are patent without stenosis. Bilateral MCA branches are within normal limits. Venous sinuses: Patent. Anatomic variants: Mildly dominant right vertebral artery. Review of the MIP images confirms the above findings  IMPRESSION: 1. Positive for patchy Acute/Subacute Left PICA Cerebellar Infarcts. No hemorrhagic transformation or significant mass effect. A smaller Right cerebellar infarct might be chronic (see #2). 2. 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 And contralateral Right PICA irregularity with tandem stenoses. But both vertebral arteries remain normal, and remaining posterior circulation is negative. 3. Minimal carotid atherosclerosis. No other significant arterial stenosis. 4. Punctate retained metallic foreign body in the left forehead scalp, new since 2016. Electronically Signed: By: HGenevie AnnM.D. On: 02/05/2022 06:41     PHYSICAL EXAM  Temp:  [97.8 F (36.6 C)-99.1 F (37.3 C)] 99.1 F (37.3 C) (12/10 1129) Pulse Rate:  [53-80] 70 (12/10 1129) Resp:  [10-18] 17 (12/10 1129) BP: (  103-146)/(64-96) 139/92 (12/10 1129) SpO2:  [92 %-99 %] 95 % (12/10 1129)  General - Well nourished, well developed, in no apparent distress.  Ophthalmologic - fundi not visualized due to noncooperation.  Cardiovascular - Regular rhythm and rate.  Mental Status -  Level of arousal and orientation to time, place, and person were intact. Language including expression, naming, repetition, comprehension was assessed and found intact. Fund of Knowledge was assessed and was intact.  Cranial Nerves II - XII - II - Visual field intact OU. III, IV, VI - Extraocular movements intact. V - Facial sensation intact bilaterally. VII - Facial movement intact bilaterally. VIII - Hearing & vestibular intact bilaterally. No nystagmus X - Palate elevates symmetrically. XI - Chin turning & shoulder shrug intact bilaterally. XII - Tongue protrusion intact.  Motor Strength - The patient's strength was normal in all extremities and pronator drift was absent.  Bulk was normal and fasciculations were absent.   Motor Tone - Muscle tone was assessed at the neck and appendages and was normal.  Reflexes - The patient's  reflexes were symmetrical in all extremities and he had no pathological reflexes.  Sensory - Light touch, temperature/pinprick were assessed and were symmetrical.    Coordination - The patient had normal movements in the hands and feet with no ataxia or dysmetria.  Tremor was absent.  Gait and Station - deferred.   ASSESSMENT/PLAN [name] is a 51 y.o. male with history of HTN, smoker, alcohol abuse admitted for intermittent dizziness, HA and N/V. No tPA given due to outside window.    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 Old right cerebellar infarct.  CTA head and neck left PICA occlusion and right PICA tandem stenosis  2D Echo  60-65% LDL 87 HgbA1c pending lovenox for VTE prophylaxis No antithrombotic prior to admission, now on aspirin 81 mg daily and clopidogrel 75 mg daily DAPT for 3 months given large vessel disease and then ASA alone.  Patient counseled to be compliant with his antithrombotic medications Ongoing aggressive stroke risk factor management Therapy recommendations:  none Disposition:  pending  Hypertension Stable On home BP meds Long term BP goal normotensive  Hyperlipidemia Home meds:  crestor 10  LDL 87, goal < 70 Now on crestor 20 Continue statin at discharge  Tobacco abuse Current smoker Smoking cessation counseling provided Nicotine patch provided Pt is willing to quit  Other Stroke Risk Factors ETOH use, education on alcohol limitation provided Obesity, Body mass index is 31.91 kg/m.   Other Active Problems ESR/CRP neg Increase MCV and MCH - B12 and FA [facility]  Hospital day # 0  Neurology will sign off. Please call with questions. Pt will follow up with stroke clinic NP at [facility] [address] [address] in about 4 weeks. Thanks for the consult.   Rosalin Hawking, MD PhD Stroke  Neurology 02/06/2022 3:00 PM    To contact Stroke Continuity provider, please refer to [email or web address] After hours, contact General Neurology

## 2022-02-06 NOTE — Plan of Care (Signed)

## 2022-02-06 NOTE — ED Notes (Signed)
ED TO INPATIENT HANDOFF REPORT  ED Nurse Name and Phone #: Joaquin Courts [phone]  S Name/Age/Gender Omar Ward 51 y.o. male Room/Bed: 012C/012C  Code Status   Code Status: Full Code  Home/SNF/Other Home Patient oriented to: self, place, time, and situation Is this baseline? Yes   Triage Complete: Triage complete  Chief Complaint CVA (cerebrovascular accident) [facility]) [I63.9]  Triage Note Pt presents for "dizzy spells"  Pt reports this has been going on for a week.  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  Pt reports he always has to sleep propped up on pillows to prevent dizziness.     Allergies Allergies  Allergen Reactions   Norvasc [Amlodipine] Other (See Comments)    Lower extremity swelling    Level of Care/Admitting Diagnosis ED Disposition     ED Disposition  Admit   Condition  --   Comment  Hospital Area: [facility] [[id number]]  Level of Care: Telemetry Medical [104]  May place patient in observation at [facility] or [facility] if equivalent level of care is available:: Yes  Covid Evaluation: Asymptomatic - no recent exposure (last 10 days) testing not required  Diagnosis: CVA (cerebrovascular accident) [facility]) [[id number]]  Admitting Physician: Silvio Pate  Attending Physician: Gust Rung [[id number]]          B Medical/Surgery History Past Medical History:  Diagnosis Date   Dyslipidemia    Hernia, abdominal    Hypertension    PUD (peptic ulcer disease)    Sleep apnea    CPAP   Past Surgical History:  Procedure Laterality Date   Knee arthrocopy        A IV Location/Drains/Wounds Patient Lines/Drains/Airways Status     Active Line/Drains/Airways     Name Placement date Placement time Site Days   Peripheral IV 02/05/22 20 G Right Antecubital 02/05/22  0305  Antecubital  1            Intake/Output Last 24 hours No intake or output data in the 24 hours ending 02/06/22  0442  Labs/Imaging Results for orders placed or performed during the hospital encounter of 02/05/22 (from the past 48 hour(s))  Protime-INR     Status: None   Collection Time: 02/05/22  2:00 AM  Result Value Ref Range   Prothrombin Time 13.0 11.4 - 15.2 seconds   INR 1.0 0.8 - 1.2    Comment: (NOTE) INR goal varies based on device and disease states. Performed at [facility], [address] [place], [place] [address]   APTT     Status: None   Collection Time: 02/05/22  2:00 AM  Result Value Ref Range   aPTT 32 24 - 36 seconds    Comment: Performed at [facility], [address] [place], [place] [address]  CBC     Status: Abnormal   Collection Time: 02/05/22  2:00 AM  Result Value Ref Range   WBC 9.4 4.0 - 10.5 K/uL   RBC 4.30 4.22 - 5.81 MIL/uL   Hemoglobin 14.6 13.0 - 17.0 g/dL   HCT 71.6 96.7 - 89.3 %   MCV 101.6 (H) 80.0 - 100.0 fL   MCH 34.0 26.0 - 34.0 pg   MCHC 33.4 30.0 - 36.0 g/dL   RDW 81.0 17.5 - 10.2 %   Platelets 186 150 - 400 K/uL   nRBC 0.0 0.0 - 0.2 %    Comment: Performed at [facility], [address] [place], [place] [address]  Differential     Status: None   Collection Time: 02/05/22  2:00 AM  Result Value Ref Range   Neutrophils Relative % 61 %   Neutro Abs 5.7 1.7 - 7.7 K/uL   Lymphocytes Relative 30 %   Lymphs Abs 2.8 0.7 - 4.0 K/uL   Monocytes Relative 8 %   Monocytes Absolute 0.7 0.1 - 1.0 K/uL   Eosinophils Relative 1 %   Eosinophils Absolute 0.1 0.0 - 0.5 K/uL   Basophils Relative 0 %   Basophils Absolute 0.0 0.0 - 0.1 K/uL   Immature Granulocytes 0 %   Abs Immature Granulocytes 0.04 0.00 - 0.07 K/uL    Comment: Performed at [facility], [address] [place], [place] [address]  Comprehensive metabolic panel     Status: Abnormal   Collection Time: 02/05/22  2:00 AM  Result Value Ref Range   Sodium 135 135 - 145 mmol/L   Potassium 3.7 3.5 - 5.1 mmol/L   Chloride 103 98 - 111 mmol/L   CO2 20 (L) 22 - 32  mmol/L   Glucose, Bld 117 (H) 70 - 99 mg/dL    Comment: Glucose reference range applies only to samples taken after fasting for at least 8 hours.   BUN 20 6 - 20 mg/dL   Creatinine, Ser 0.98 0.61 - 1.24 mg/dL   Calcium 8.9 8.9 - 11.9 mg/dL   Total Protein 6.7 6.5 - 8.1 g/dL   Albumin 3.8 3.5 - 5.0 g/dL   AST 27 15 - 41 U/L   ALT 35 0 - 44 U/L   Alkaline Phosphatase 49 38 - 126 U/L   Total Bilirubin 0.6 0.3 - 1.2 mg/dL   GFR, Estimated >14 >78 mL/min    Comment: (NOTE) Calculated using the CKD-EPI Creatinine Equation (2021)    Anion gap 12 5 - 15    Comment: Performed at [facility], [address], [place], [place] [address]  Ethanol     Status: None   Collection Time: 02/05/22  2:00 AM  Result Value Ref Range   Alcohol, Ethyl (B) <10 <10 mg/dL    Comment: (NOTE) Lowest detectable limit for serum alcohol is 10 mg/dL.  For medical purposes only. Performed at [facility], [address], [place], [place] [address]   I-stat chem 8, ED     Status: Abnormal   Collection Time: 02/05/22  2:41 AM  Result Value Ref Range   Sodium 136 135 - 145 mmol/L   Potassium 4.4 3.5 - 5.1 mmol/L   Chloride 102 98 - 111 mmol/L   BUN 29 (H) 6 - 20 mg/dL   Creatinine, Ser 5.78 0.61 - 1.24 mg/dL   Glucose, Bld 469 (H) 70 - 99 mg/dL    Comment: Glucose reference range applies only to samples taken after fasting for at least 8 hours.   Calcium, Ion 1.09 (L) 1.15 - 1.40 mmol/L   TCO2 26 22 - 32 mmol/L   Hemoglobin 15.3 13.0 - 17.0 g/dL   HCT 62.9 52.8 - 41.3 %  Sedimentation rate     Status: None   Collection Time: 02/05/22  9:09 AM  Result Value Ref Range   Sed Rate 2 0 - 16 mm/hr    Comment: Performed at [facility], [address] [place], [place] [address]  C-reactive protein     Status: None   Collection Time: 02/05/22  9:09 AM  Result Value Ref Range   CRP <0.5 <1.0 mg/dL    Comment: Performed at [facility], [address] [place], [place] [address]  Lipid  panel     Status: Abnormal   Collection Time: 02/05/22  9:09 AM  Result Value Ref Range   Cholesterol 166 0 - 200 mg/dL   Triglycerides 604 (H) <150 mg/dL   HDL 39 (L) >54 mg/dL   Total CHOL/HDL Ratio 4.3 RATIO   VLDL 40 0 - 40 mg/dL   LDL Cholesterol 87 0 - 99 mg/dL    Comment:        Total Cholesterol/HDL:CHD Risk Coronary Heart Disease Risk Table                     Men   Women  1/2 Average Risk   3.4   3.3  Average Risk       5.0   4.4  2 X Average Risk   9.6   7.1  3 X Average Risk  23.4   11.0        Use the calculated Patient Ratio above and the CHD Risk Table to determine the patient's CHD Risk.        ATP III CLASSIFICATION (LDL):  <100     mg/dL   Optimal  098-119  mg/dL   Near or Above                    Optimal  130-159  mg/dL   Borderline  147-829  mg/dL   High  >562     mg/dL   Very High Performed at [facility], [address] [place], [place] [address]   HIV Antibody (routine testing w rflx)     Status: None   Collection Time: 02/05/22  9:09 AM  Result Value Ref Range   HIV Screen 4th Generation wRfx Non Reactive Non Reactive    Comment: Performed at [facility], [address] [place], [place] [address]  Magnesium     Status: None   Collection Time: 02/05/22  9:09 AM  Result Value Ref Range   Magnesium 1.9 1.7 - 2.4 mg/dL    Comment: Performed at [facility], [address] [place], [place] [address]  Phosphorus     Status: None   Collection Time: 02/05/22  9:09 AM  Result Value Ref Range   Phosphorus 2.7 2.5 - 4.6 mg/dL    Comment: Performed at [facility], [address], [place], [place] [address]  CBC     Status: Abnormal   Collection Time: 02/06/22  3:59 AM  Result Value Ref Range   WBC 7.6 4.0 - 10.5 K/uL   RBC 4.29 4.22 - 5.81 MIL/uL   Hemoglobin 14.8 13.0 - 17.0 g/dL   HCT 24.4 01.0 - 27.2 %   MCV 102.1 (H) 80.0 - 100.0 fL   MCH 34.5 (H) 26.0 - 34.0 pg   MCHC 33.8 30.0 - 36.0 g/dL   RDW 53.6 64.4 - 03.4 %    Platelets 154 150 - 400 K/uL   nRBC 0.0 0.0 - 0.2 %    Comment: Performed at [facility], [address], [place], [place] [address]  Basic metabolic panel     Status: Abnormal   Collection Time: 02/06/22  3:59 AM  Result Value Ref Range   Sodium 140 135 - 145 mmol/L   Potassium  4.0 3.5 - 5.1 mmol/L   Chloride 107 98 - 111 mmol/L   CO2 22 22 - 32 mmol/L   Glucose, Bld 110 (H) 70 - 99 mg/dL    Comment: Glucose reference range applies only to samples taken after fasting for at least 8 hours.   BUN 12 6 - 20 mg/dL   Creatinine, Ser 1.610.87 0.61 - 1.24 mg/dL   Calcium 9.4 8.9 - 09.610.3 mg/dL   GFR, Estimated >04>60 >54>60 mL/min    Comment: (NOTE) Calculated using the CKD-EPI Creatinine Equation (2021)    Anion gap 11 5 - 15    Comment: Performed at [facility], [address] [place], [place] [address]   ECHOCARDIOGRAM COMPLETE BUBBLE STUDY  Result Date: 02/05/2022    ECHOCARDIOGRAM REPORT   Patient Name:   Omar HalstedKEVIN M Canal Date of Exam: 02/05/2022 Medical Rec #:  [id number]      Height:       69.0 in Accession #:    [id number]     Weight:       216.0 lb Date of Birth:  07/30/1970       BSA:          2.135 m Patient Age:    51 years       BP:           141/83 mmHg Patient Gender: M              HR:           67 bpm. Exam Location:  Inpatient Procedure: 2D Echo, Cardiac Doppler, Color Doppler and Saline Contrast Bubble            Study Indications:    Stroke  History:        Patient has no prior history of Echocardiogram examinations.                 Stroke, Signs/Symptoms:Chest Pain; Risk Factors:Hypertension,                 Current Smoker and Dyslipidemia.  Sonographer:    Mikki Harbororothy Buchanan Referring Phys: [id number] CORTNEY E DE LA TORRE IMPRESSIONS  1. Left ventricular ejection fraction, by estimation, is 60 to 65%. The left ventricle has normal function. The left ventricle has no regional wall motion abnormalities. There is moderate concentric left ventricular hypertrophy. Left ventricular  diastolic parameters are consistent with Grade I diastolic dysfunction (impaired relaxation).  2. Right ventricular systolic function is normal. The right ventricular size is normal. Tricuspid regurgitation signal is inadequate for assessing PA pressure.  3. The mitral valve is normal in structure. No evidence of mitral valve regurgitation. No evidence of mitral stenosis.  4. The aortic valve is normal in structure. Aortic valve regurgitation is not visualized. No aortic stenosis is present.  5. The inferior vena cava is normal in size with greater than 50% respiratory variability, suggesting right atrial pressure of 3 mmHg. Conclusion(s)/Recommendation(s): No intracardiac source of embolism detected on this transthoracic study. Consider a transesophageal echocardiogram to exclude cardiac source of embolism if clinically indicated. FINDINGS  Left Ventricle: Left ventricular ejection fraction, by estimation, is 60 to 65%. The left ventricle has normal function. The left ventricle has no regional wall motion abnormalities. The left ventricular internal cavity size was normal in size. There is  moderate concentric left ventricular hypertrophy. Left ventricular diastolic parameters are consistent with Grade I diastolic dysfunction (impaired relaxation). Normal left ventricular filling pressure. Right Ventricle: The right ventricular size is  normal. No increase in right ventricular wall thickness. Right ventricular systolic function is normal. Tricuspid regurgitation signal is inadequate for assessing PA pressure. Left Atrium: Left atrial size was normal in size. Right Atrium: Right atrial size was normal in size. Pericardium: There is no evidence of pericardial effusion. Mitral Valve: The mitral valve is normal in structure. No evidence of mitral valve regurgitation. No evidence of mitral valve stenosis. MV peak gradient, 4.3 mmHg. The mean mitral valve gradient is 1.0 mmHg. Tricuspid Valve: The tricuspid valve is  normal in structure. Tricuspid valve regurgitation is not demonstrated. No evidence of tricuspid stenosis. Aortic Valve: The aortic valve is normal in structure. Aortic valve regurgitation is not visualized. No aortic stenosis is present. Aortic valve mean gradient measures 6.0 mmHg. Aortic valve peak gradient measures 13.0 mmHg. Aortic valve area, by VTI measures 2.81 cm. Pulmonic Valve: The pulmonic valve was normal in structure. Pulmonic valve regurgitation is not visualized. No evidence of pulmonic stenosis. Aorta: The aortic root is normal in size and structure. Venous: The inferior vena cava is normal in size with greater than 50% respiratory variability, suggesting right atrial pressure of 3 mmHg. IAS/Shunts: No atrial level shunt detected by color flow Doppler. Agitated saline contrast was given intravenously to evaluate for intracardiac shunting.  LEFT VENTRICLE PLAX 2D LVIDd:         4.50 cm   Diastology LVIDs:         2.20 cm   LV e' medial:    6.64 cm/s LV PW:         1.20 cm   LV E/e' medial:  8.3 LV IVS:        1.40 cm   LV e' lateral:   8.27 cm/s LVOT diam:     2.10 cm   LV E/e' lateral: 6.6 LV SV:         114 LV SV Index:   53 LVOT Area:     3.46 cm  RIGHT VENTRICLE RV Basal diam:  3.87 cm RV Mid diam:    3.10 cm RV S prime:     17.30 cm/s TAPSE (M-mode): 3.1 cm LEFT ATRIUM             Index        RIGHT ATRIUM           Index LA diam:        3.80 cm 1.78 cm/m   RA Area:     16.80 cm LA Vol (A2C):   55.0 ml 25.76 ml/m  RA Volume:   47.50 ml  22.25 ml/m LA Vol (A4C):   42.5 ml 19.91 ml/m LA Biplane Vol: 49.1 ml 23.00 ml/m  AORTIC VALVE                     PULMONIC VALVE AV Area (Vmax):    3.06 cm      PV Vmax:       1.21 m/s AV Area (Vmean):   3.07 cm      PV Peak grad:  5.9 mmHg AV Area (VTI):     2.81 cm AV Vmax:           180.00 cm/s AV Vmean:          114.000 cm/s AV VTI:            0.405 m AV Peak Grad:      13.0 mmHg AV Mean Grad:      6.0 mmHg LVOT Vmax:  159.00 cm/s LVOT  Vmean:        101.000 cm/s LVOT VTI:          0.329 m LVOT/AV VTI ratio: 0.81  AORTA Ao Root diam: 3.40 cm MITRAL VALVE MV Area (PHT): 2.38 cm    SHUNTS MV Area VTI:   3.97 cm    Systemic VTI:  0.33 m MV Peak grad:  4.3 mmHg    Systemic Diam: 2.10 cm MV Mean grad:  1.0 mmHg MV Vmax:       1.04 m/s MV Vmean:      50.5 cm/s MV Decel Time: 319 msec MV E velocity: 54.80 cm/s MV A velocity: 82.30 cm/s MV E/A ratio:  0.67 Armanda Magic MD Electronically signed by Armanda Magic MD Signature Date/Time: 02/05/2022/3:16:17 PM    Final    MR BRAIN W WO CONTRAST  Result Date: 02/05/2022 CLINICAL DATA:  Stroke.  Follow-up.  Headache new onset.  Somnolent. EXAM: MRI HEAD WITHOUT AND WITH CONTRAST TECHNIQUE: Multiplanar, multiecho pulse sequences of the brain and surrounding structures were obtained without and with intravenous contrast. CONTRAST:  10mL GADAVIST GADOBUTROL 1 MMOL/ML IV SOLN COMPARISON:  CT studies same day FINDINGS: Brain: Diffusion imaging shows patchy acute infarctions within the inferior cerebellum on the left, PICA distribution. No involvement of the medulla. No other acute infarction. Old small vessel infarction in the right cerebellum. Minimal old small vessel infarction in the white matter adjacent to the frontal horn of the left lateral ventricle. Cerebral hemispheres are vies otherwise normal without other small or large vessel insult. No mass, hemorrhage, hydrocephalus or extra-axial collection. After contrast administration, no abnormal enhancement occurs. Vascular: Major vessels at the base of the brain show flow. Skull and upper cervical spine: Negative Sinuses/Orbits: Clear/normal Other: None IMPRESSION: 1. Patchy acute infarctions in the inferior cerebellum on the left, PICA distribution. No mass effect or hemorrhage. 2. Old small vessel infarction in the right cerebellum. Minimal old small vessel infarction in the white matter adjacent to the frontal horn of the left lateral ventricle.  Electronically Signed   By: Paulina Fusi M.D.   On: 02/05/2022 13:39   TEMPORAL ARTERY  Result Date: 02/05/2022  TEMPORAL ARTERY REPORT Patient Name:  KAHEEM HALLECK  Date of Exam:   02/05/2022 Medical Rec #: [id number]       Accession #:    [id number] Date of Birth: 1970-03-29        Patient Gender: M Patient Age:   11 years Exam Location:  [facility] Procedure:      VAS [place] TEMPORAL ARTERY BILATERAL Referring Phys: Dewitt Hoes DE LA TORRE --------------------------------------------------------------------------------  Indications: Headache and scalp tenderness. High Risk Factors: Age > 50 yrs.  Comparison Study: No prior study Performing Technologist: Sherren Kerns RVS Supporting Technologist: Jean Rosenthal RDMS, RVT  Examination Guidelines: Patient in reclined position. 2D, color and spectral doppler sampling in the temporal artery along the hairline and temple in the longitudinal plane. 2D images along the hairline and temple in the transverse plane. Exam is bilateral.  Summary: Absence of a "halo" sign in the bilateral temporal artery, although not definitive, makes a diagnosis of temporal arteritis unlikely.  *See table(s) above for measurements and observations.    Preliminary    DG FEMUR 1V LEFT  Result Date: 02/05/2022 CLINICAL DATA:  Metal screen prior to MRI. EXAM: LEFT FEMUR 1 VIEW COMPARISON:  None Available. FINDINGS: Normal appearance. No sign of metallic foreign object. No pathologic finding. IMPRESSION: Normal. No sign of  metallic foreign object. Electronically Signed   By: Paulina Fusi M.D.   On: 02/05/2022 11:33   DG Pelvis 1-2 Views  Result Date: 02/05/2022 CLINICAL DATA:  Metal screen prior to MRI. EXAM: PELVIS - 1-2 VIEW COMPARISON:  None Available. FINDINGS: Iodinated contrast fills the bladder. No evidence regional metallic entity. No abnormal bone or joint finding. IMPRESSION: Iodinated contrast fills the bladder. No evidence of regional metallic entity. Electronically Signed    By: Paulina Fusi M.D.   On: 02/05/2022 11:33   CT Angio Head Neck W WO CM  Addendum Date: 02/05/2022   ADDENDUM REPORT: 02/05/2022 06:59 ADDENDUM: Study discussed by telephone with Dr. Tilden Fossa on 02/05/2022 at 0641 hours. Electronically Signed   By: Odessa Fleming M.D.   On: 02/05/2022 06:59   Result Date: 02/05/2022 CLINICAL DATA:  51 year old male with severe headache and dizziness for the past week. Vertigo. EXAM: CT ANGIOGRAPHY HEAD AND NECK TECHNIQUE: Multidetector CT imaging of the head and neck was performed using the standard protocol during bolus administration of intravenous contrast. Multiplanar CT image reconstructions and MIPs were obtained to evaluate the vascular anatomy. Carotid stenosis measurements (when applicable) are obtained utilizing NASCET criteria, using the distal internal carotid diameter as the denominator. RADIATION DOSE REDUCTION: This exam was performed according to the departmental dose-optimization program which includes automated exposure control, adjustment of the mA and/or kV according to patient size and/or use of iterative reconstruction technique. CONTRAST:  75mL OMNIPAQUE IOHEXOL 350 MG/ML SOLN COMPARISON:  Head CT 11/08/2014. FINDINGS: CT HEAD FINDINGS Brain: Cerebral volume is within normal limits for age. No midline shift, ventriculomegaly, mass effect, evidence of mass lesion, intracranial hemorrhage. Somewhat indistinct patchy hypodensity in the left cerebellum including the left tonsil is suspicious for recent left PICA infarct. No associated hemorrhage or mass effect. Contralateral much smaller right cerebellar hypodensity also on series 5, image 6 might be chronic but is new since 2016. Vascular: Mild Calcified atherosclerosis at the skull base. Skull: Intact, negative. Sinuses/Orbits: Tympanic cavities are clear. Mastoids are well aerated, stable. Trace paranasal sinus mucosal thickening. No sinus fluid levels. Other: Small retained left forehead metallic foreign  body overlying the outer table of the skull was not visible previously. Otherwise negative orbits and scalp. CTA NECK Skeleton: Negative. Upper chest: Mild pulmonary atelectasis. Other neck: Mild postinflammatory tonsillar dystrophic calcifications. Aortic arch: 3 vessel arch configuration. Mild arch tortuosity. Minimal arch atherosclerosis. Right carotid system: Minimal atherosclerosis at the right carotid bifurcation. Mild tortuosity and no stenosis. Left carotid system: Similar mild tortuosity and minimal atherosclerosis without stenosis. Vertebral arteries: Mild soft plaque in the proximal right subclavian artery with no significant stenosis. Right vertebral artery origin is normal. Right vertebral artery is mildly non dominant but patent and within normal limits to the skull base. No significant proximal left subclavian plaque. Left vertebral artery origin is normal. Tortuous left V1 segment. Dominant left vertebral artery is patent and within normal limits to the skull base. CTA HEAD Posterior circulation: Distal vertebral arteries and vertebrobasilar junction are patent with no significant plaque or stenosis. Left V4 is dominant. The left PICA origin is patent on series 11, image 160, but the left PICA is otherwise highly attenuated, poorly enhancing. Contralateral right PICA origin is normal. But there is moderate to severe distal right PICA irregularity and stenosis on series 16, image 21. Patent basilar artery without stenosis. Normal SCA and PCA origins. Right posterior communicating artery, the left is diminutive or absent. Bilateral PCA branches are within normal  limits. Anterior circulation: Both ICA siphons are patent. No plaque or stenosis on the left. Minimal right siphon calcified plaque in the supraclinoid segment without stenosis. Normal ophthalmic and right posterior communicating artery origins. Patent carotid termini. Normal MCA and ACA origins. Diminutive or absent anterior communicating  artery. Bilateral ACA branches are within normal limits. Left MCA M1 segment and trifurcation are patent without stenosis. Right MCA M1 segment and bifurcation are patent without stenosis. Bilateral MCA branches are within normal limits. Venous sinuses: Patent. Anatomic variants: Mildly dominant right vertebral artery. Review of the MIP images confirms the above findings IMPRESSION: 1. Positive for patchy Acute/Subacute Left PICA Cerebellar Infarcts. No hemorrhagic transformation or significant mass effect. A smaller Right cerebellar infarct might be chronic (see #2). 2. 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 And contralateral Right PICA irregularity with tandem stenoses. But both vertebral arteries remain normal, and remaining posterior circulation is negative. 3. Minimal carotid atherosclerosis. No other significant arterial stenosis. 4. Punctate retained metallic foreign body in the left forehead scalp, new since 2016. Electronically Signed: By: Odessa Fleming M.D. On: 02/05/2022 06:41    Pending Labs Unresulted Labs (From admission, onward)     Start     Ordered   02/05/22 0834  Hemoglobin A1c  Once,   URGENT        02/05/22 0836            Vitals/Pain Today's Vitals   02/06/22 0345 02/06/22 0357 02/06/22 0400 02/06/22 0401  BP: 128/86  128/86   Pulse: (!) 59  64   Resp: 12     Temp:      TempSrc:      SpO2: 99%   99%  Weight:      PainSc:  5       Isolation Precautions No active isolations  Medications Medications  nicotine (NICODERM CQ - dosed in mg/24 hours) patch 14 mg (14 mg Transdermal Patch Applied 02/05/22 1051)  aspirin chewable tablet 81 mg (81 mg Oral Given 02/05/22 1050)  clopidogrel (PLAVIX) tablet 75 mg (75 mg Oral Given 02/05/22 1050)  enoxaparin (LOVENOX) injection 40 mg (40 mg Subcutaneous Given 02/05/22 1051)  acetaminophen (TYLENOL) tablet 650 mg (650 mg Oral Given 02/06/22 0357)    Or  acetaminophen (TYLENOL) suppository 650 mg ( Rectal See Alternative  02/06/22 0357)  ondansetron (ZOFRAN) tablet 4 mg (has no administration in time range)    Or  ondansetron (ZOFRAN) injection 4 mg (has no administration in time range)  folic acid (FOLVITE) tablet 1 mg (1 mg Oral Given 02/05/22 1050)  multivitamin with minerals tablet 1 tablet (1 tablet Oral Given 02/05/22 1050)  thiamine (VITAMIN B1) tablet 100 mg (100 mg Oral Given 02/05/22 1050)    Or  thiamine (VITAMIN B1) injection 100 mg ( Intravenous See Alternative 02/05/22 1050)  buPROPion (WELLBUTRIN XL) 24 hr tablet 300 mg (has no administration in time range)  busPIRone (BUSPAR) tablet 5 mg (5 mg Oral Given 02/05/22 2206)  cyclobenzaprine (FLEXERIL) tablet 10 mg (has no administration in time range)  traZODone (DESYREL) tablet 25 mg (25 mg Oral Given 02/05/22 2206)  irbesartan (AVAPRO) tablet 300 mg (0 mg Oral Hold 02/05/22 1513)  hydrochlorothiazide (HYDRODIURIL) tablet 25 mg (0 mg Oral Hold 02/05/22 1513)  rosuvastatin (CRESTOR) tablet 20 mg (has no administration in time range)  metoCLOPramide (REGLAN) injection 10 mg (10 mg Intravenous Given 02/05/22 0307)  iohexol (OMNIPAQUE) 350 MG/ML injection 75 mL (75 mLs Intravenous Contrast Given 02/05/22 0620)  gadobutrol (  GADAVIST) 1 MMOL/ML injection 10 mL (10 mLs Intravenous Contrast Given 02/05/22 1313)    Mobility walks Low fall risk   Focused Assessments Neuro Assessment Handoff:  Swallow screen pass? Yes    NIH Stroke Scale ( + Modified Stroke Scale Criteria)  Interval: Shift assessment Level of Consciousness (1a.)   : Alert, keenly responsive LOC Questions (1b. )   +: Answers both questions correctly LOC Commands (1c. )   + : Performs both tasks correctly Best Gaze (2. )  +: Normal Visual (3. )  +: No visual loss Facial Palsy (4. )    : Normal symmetrical movements Motor Arm, Left (5a. )   +: No drift Motor Arm, Right (5b. )   +: No drift Motor Leg, Left (6a. )   +: No drift Motor Leg, Right (6b. )   +: No drift Limb Ataxia (7. ):  Absent Sensory (8. )   +: Normal, no sensory loss Best Language (9. )   +: No aphasia Dysarthria (10. ): Normal Extinction/Inattention (11.)   +: No Abnormality Modified SS Total  +: 0 Complete NIHSS TOTAL: 0 Last date known well: 01/31/22   Neuro Assessment: Within Defined Limits Neuro Checks:   Initial (02/05/22 0722)  Last Documented NIHSS Modified Score: 0 (02/06/22 0401) Has TPA been given? No If patient is a Neuro Trauma and patient is going to OR before floor call report to 4N Charge nurse: [phone] or [phone]   R Recommendations: See Admitting Provider Note  Report given to:   Additional Notes:

## 2022-02-07 ENCOUNTER — Other Ambulatory Visit: Payer: Self-pay

## 2022-02-07 ENCOUNTER — Other Ambulatory Visit (HOSPITAL_COMMUNITY): Payer: Self-pay

## 2022-02-07 LAB — HEMOGLOBIN A1C
Hgb A1c MFr Bld: 5.3 % (ref 4.8–5.6)
Mean Plasma Glucose: 105 mg/dL

## 2022-02-08 DIAGNOSIS — F4322 Adjustment disorder with anxiety: Secondary | ICD-10-CM | POA: Diagnosis not present

## 2022-02-08 DIAGNOSIS — I10 Essential (primary) hypertension: Secondary | ICD-10-CM | POA: Diagnosis not present

## 2022-02-08 DIAGNOSIS — Z09 Encounter for follow-up examination after completed treatment for conditions other than malignant neoplasm: Secondary | ICD-10-CM | POA: Diagnosis not present

## 2022-02-08 DIAGNOSIS — Z8673 Personal history of transient ischemic attack (TIA), and cerebral infarction without residual deficits: Secondary | ICD-10-CM | POA: Diagnosis not present

## 2022-02-08 DIAGNOSIS — G4709 Other insomnia: Secondary | ICD-10-CM | POA: Diagnosis not present

## 2022-02-08 DIAGNOSIS — E782 Mixed hyperlipidemia: Secondary | ICD-10-CM | POA: Diagnosis not present

## 2022-02-09 ENCOUNTER — Other Ambulatory Visit (HOSPITAL_BASED_OUTPATIENT_CLINIC_OR_DEPARTMENT_OTHER): Payer: Self-pay

## 2022-02-24 ENCOUNTER — Other Ambulatory Visit (HOSPITAL_COMMUNITY): Payer: Self-pay

## 2022-02-24 MED ORDER — BUSPIRONE HCL 5 MG PO TABS
5.0000 mg | ORAL_TABLET | Freq: Three times a day (TID) | ORAL | 0 refills | Status: DC
  Filled 2022-02-24: qty 60, 20d supply, fill #0

## 2022-02-24 MED ORDER — ESCITALOPRAM OXALATE 10 MG PO TABS
10.0000 mg | ORAL_TABLET | Freq: Every day | ORAL | 1 refills | Status: DC
  Filled 2022-02-24: qty 30, 30d supply, fill #0
  Filled 2022-03-28: qty 30, 30d supply, fill #1

## 2022-03-28 ENCOUNTER — Other Ambulatory Visit: Payer: Self-pay | Admitting: Internal Medicine

## 2022-03-28 ENCOUNTER — Other Ambulatory Visit (HOSPITAL_COMMUNITY): Payer: Self-pay

## 2022-03-31 ENCOUNTER — Other Ambulatory Visit (HOSPITAL_COMMUNITY): Payer: Self-pay

## 2022-04-25 ENCOUNTER — Other Ambulatory Visit (HOSPITAL_COMMUNITY): Payer: Self-pay

## 2022-04-25 MED ORDER — ESCITALOPRAM OXALATE 10 MG PO TABS
10.0000 mg | ORAL_TABLET | Freq: Every day | ORAL | 1 refills | Status: DC
  Filled 2022-04-25: qty 30, 30d supply, fill #0
  Filled 2022-05-30: qty 30, 30d supply, fill #1

## 2022-04-25 MED ORDER — BUPROPION HCL ER (XL) 300 MG PO TB24
300.0000 mg | ORAL_TABLET | Freq: Every day | ORAL | 2 refills | Status: DC
  Filled 2022-04-25: qty 30, 30d supply, fill #0
  Filled 2022-05-30: qty 30, 30d supply, fill #1

## 2022-04-25 MED ORDER — DOXEPIN HCL 10 MG PO CAPS
20.0000 mg | ORAL_CAPSULE | Freq: Every evening | ORAL | 2 refills | Status: DC
  Filled 2022-04-25: qty 60, 30d supply, fill #0
  Filled 2022-05-30 – 2022-07-11 (×2): qty 60, 30d supply, fill #1
  Filled 2022-08-04: qty 60, 30d supply, fill #2

## 2022-04-26 ENCOUNTER — Other Ambulatory Visit (HOSPITAL_COMMUNITY): Payer: Self-pay

## 2022-04-29 ENCOUNTER — Other Ambulatory Visit (HOSPITAL_COMMUNITY): Payer: Self-pay

## 2022-04-29 DIAGNOSIS — J449 Chronic obstructive pulmonary disease, unspecified: Secondary | ICD-10-CM | POA: Diagnosis not present

## 2022-04-29 DIAGNOSIS — G4726 Circadian rhythm sleep disorder, shift work type: Secondary | ICD-10-CM | POA: Diagnosis not present

## 2022-04-29 DIAGNOSIS — F32A Depression, unspecified: Secondary | ICD-10-CM | POA: Diagnosis not present

## 2022-04-29 DIAGNOSIS — F17210 Nicotine dependence, cigarettes, uncomplicated: Secondary | ICD-10-CM | POA: Diagnosis not present

## 2022-04-29 DIAGNOSIS — I2510 Atherosclerotic heart disease of native coronary artery without angina pectoris: Secondary | ICD-10-CM | POA: Diagnosis not present

## 2022-04-29 DIAGNOSIS — Z8673 Personal history of transient ischemic attack (TIA), and cerebral infarction without residual deficits: Secondary | ICD-10-CM | POA: Diagnosis not present

## 2022-04-29 DIAGNOSIS — I10 Essential (primary) hypertension: Secondary | ICD-10-CM | POA: Diagnosis not present

## 2022-04-29 MED ORDER — ALBUTEROL SULFATE HFA 108 (90 BASE) MCG/ACT IN AERS
2.0000 | INHALATION_SPRAY | Freq: Four times a day (QID) | RESPIRATORY_TRACT | 5 refills | Status: DC | PRN
  Filled 2022-04-29 – 2022-07-11 (×3): qty 6.7, 25d supply, fill #0
  Filled 2022-08-04: qty 6.7, 25d supply, fill #1
  Filled 2022-09-19: qty 6.7, 25d supply, fill #2
  Filled 2022-12-13: qty 6.7, 25d supply, fill #3
  Filled 2023-01-30: qty 6.7, 25d supply, fill #4
  Filled 2023-03-13: qty 6.7, 25d supply, fill #5

## 2022-05-12 ENCOUNTER — Other Ambulatory Visit (HOSPITAL_COMMUNITY): Payer: Self-pay

## 2022-05-30 ENCOUNTER — Other Ambulatory Visit (HOSPITAL_COMMUNITY): Payer: Self-pay

## 2022-05-30 ENCOUNTER — Other Ambulatory Visit: Payer: Self-pay

## 2022-06-10 ENCOUNTER — Other Ambulatory Visit (HOSPITAL_COMMUNITY): Payer: Self-pay

## 2022-06-10 ENCOUNTER — Other Ambulatory Visit: Payer: Self-pay

## 2022-06-10 MED ORDER — IRBESARTAN 300 MG PO TABS
300.0000 mg | ORAL_TABLET | Freq: Every day | ORAL | 5 refills | Status: DC
  Filled 2022-06-10 – 2022-07-11 (×3): qty 30, 30d supply, fill #0
  Filled 2022-08-04 – 2022-12-13 (×3): qty 30, 30d supply, fill #1
  Filled 2023-01-30: qty 30, 30d supply, fill #2
  Filled 2023-03-13: qty 30, 30d supply, fill #3
  Filled 2023-04-11 – 2023-05-25 (×2): qty 30, 30d supply, fill #4

## 2022-06-10 MED ORDER — HYDROCHLOROTHIAZIDE 25 MG PO TABS
25.0000 mg | ORAL_TABLET | Freq: Every day | ORAL | 5 refills | Status: DC
  Filled 2022-06-10 – 2022-08-04 (×3): qty 30, 30d supply, fill #0
  Filled 2022-09-19: qty 30, 30d supply, fill #1
  Filled 2022-10-27 – 2022-12-13 (×2): qty 30, 30d supply, fill #2
  Filled 2023-04-11: qty 30, 30d supply, fill #3

## 2022-06-10 MED ORDER — BUPROPION HCL ER (XL) 300 MG PO TB24
300.0000 mg | ORAL_TABLET | Freq: Every day | ORAL | 2 refills | Status: DC
  Filled 2022-06-10 – 2022-10-27 (×4): qty 30, 30d supply, fill #0
  Filled 2023-04-11: qty 30, 30d supply, fill #1
  Filled 2023-05-25: qty 30, 30d supply, fill #2

## 2022-06-10 MED ORDER — ROSUVASTATIN CALCIUM 40 MG PO TABS
40.0000 mg | ORAL_TABLET | Freq: Every day | ORAL | 0 refills | Status: DC
  Filled 2022-06-10 – 2022-07-11 (×3): qty 30, 30d supply, fill #0

## 2022-06-10 MED ORDER — HYDROCHLOROTHIAZIDE 25 MG PO TABS
25.0000 mg | ORAL_TABLET | Freq: Every day | ORAL | 5 refills | Status: DC
  Filled 2022-06-10 – 2022-07-11 (×2): qty 30, 30d supply, fill #0
  Filled 2022-08-04 – 2022-10-27 (×2): qty 30, 30d supply, fill #1
  Filled 2023-01-30: qty 30, 30d supply, fill #2
  Filled 2023-03-13: qty 30, 30d supply, fill #3
  Filled 2023-05-25: qty 30, 30d supply, fill #4

## 2022-06-10 MED ORDER — BUPROPION HCL ER (XL) 300 MG PO TB24
300.0000 mg | ORAL_TABLET | Freq: Every day | ORAL | 2 refills | Status: DC
  Filled 2022-06-10 – 2022-07-11 (×2): qty 30, 30d supply, fill #0
  Filled 2022-08-04: qty 30, 30d supply, fill #1
  Filled 2022-09-19: qty 30, 30d supply, fill #2

## 2022-06-10 MED ORDER — IRBESARTAN 300 MG PO TABS
300.0000 mg | ORAL_TABLET | Freq: Every day | ORAL | 5 refills | Status: DC
  Filled 2022-06-10 – 2022-08-04 (×2): qty 30, 30d supply, fill #0
  Filled 2022-09-19: qty 30, 30d supply, fill #1
  Filled 2022-10-27: qty 30, 30d supply, fill #2
  Filled 2023-04-11: qty 30, 30d supply, fill #3

## 2022-06-13 ENCOUNTER — Other Ambulatory Visit (HOSPITAL_COMMUNITY): Payer: Self-pay

## 2022-06-13 ENCOUNTER — Other Ambulatory Visit: Payer: Self-pay

## 2022-06-27 ENCOUNTER — Other Ambulatory Visit (HOSPITAL_COMMUNITY): Payer: Self-pay

## 2022-07-11 ENCOUNTER — Other Ambulatory Visit (HOSPITAL_COMMUNITY): Payer: Self-pay

## 2022-08-04 ENCOUNTER — Other Ambulatory Visit (HOSPITAL_COMMUNITY): Payer: Self-pay

## 2022-08-04 ENCOUNTER — Other Ambulatory Visit: Payer: Self-pay

## 2022-08-04 MED ORDER — ROSUVASTATIN CALCIUM 40 MG PO TABS
40.0000 mg | ORAL_TABLET | Freq: Every day | ORAL | 5 refills | Status: DC
  Filled 2022-08-04: qty 30, 30d supply, fill #0
  Filled 2022-09-19: qty 30, 30d supply, fill #1
  Filled 2022-10-27: qty 30, 30d supply, fill #2
  Filled 2022-12-13: qty 30, 30d supply, fill #3
  Filled 2023-01-30: qty 30, 30d supply, fill #4
  Filled 2023-03-13: qty 30, 30d supply, fill #5

## 2022-09-02 DIAGNOSIS — I10 Essential (primary) hypertension: Secondary | ICD-10-CM | POA: Diagnosis not present

## 2022-09-02 DIAGNOSIS — E6609 Other obesity due to excess calories: Secondary | ICD-10-CM | POA: Diagnosis not present

## 2022-09-02 DIAGNOSIS — D7589 Other specified diseases of blood and blood-forming organs: Secondary | ICD-10-CM | POA: Diagnosis not present

## 2022-09-02 DIAGNOSIS — Z0000 Encounter for general adult medical examination without abnormal findings: Secondary | ICD-10-CM | POA: Diagnosis not present

## 2022-09-02 DIAGNOSIS — Z131 Encounter for screening for diabetes mellitus: Secondary | ICD-10-CM | POA: Diagnosis not present

## 2022-09-02 DIAGNOSIS — Z125 Encounter for screening for malignant neoplasm of prostate: Secondary | ICD-10-CM | POA: Diagnosis not present

## 2022-09-02 DIAGNOSIS — J449 Chronic obstructive pulmonary disease, unspecified: Secondary | ICD-10-CM | POA: Diagnosis not present

## 2022-09-02 DIAGNOSIS — Z6831 Body mass index (BMI) 31.0-31.9, adult: Secondary | ICD-10-CM | POA: Diagnosis not present

## 2022-09-02 DIAGNOSIS — E559 Vitamin D deficiency, unspecified: Secondary | ICD-10-CM | POA: Diagnosis not present

## 2022-09-02 DIAGNOSIS — F4322 Adjustment disorder with anxiety: Secondary | ICD-10-CM | POA: Diagnosis not present

## 2022-09-02 DIAGNOSIS — E782 Mixed hyperlipidemia: Secondary | ICD-10-CM | POA: Diagnosis not present

## 2022-09-02 DIAGNOSIS — Z79899 Other long term (current) drug therapy: Secondary | ICD-10-CM | POA: Diagnosis not present

## 2022-09-02 DIAGNOSIS — E538 Deficiency of other specified B group vitamins: Secondary | ICD-10-CM | POA: Diagnosis not present

## 2022-09-02 DIAGNOSIS — F17200 Nicotine dependence, unspecified, uncomplicated: Secondary | ICD-10-CM | POA: Diagnosis not present

## 2022-09-02 DIAGNOSIS — Z1211 Encounter for screening for malignant neoplasm of colon: Secondary | ICD-10-CM | POA: Diagnosis not present

## 2022-09-19 ENCOUNTER — Other Ambulatory Visit (HOSPITAL_COMMUNITY): Payer: Self-pay

## 2022-10-27 ENCOUNTER — Other Ambulatory Visit: Payer: Self-pay

## 2022-10-27 ENCOUNTER — Other Ambulatory Visit (HOSPITAL_COMMUNITY): Payer: Self-pay

## 2022-10-27 MED ORDER — BUPROPION HCL ER (XL) 300 MG PO TB24
300.0000 mg | ORAL_TABLET | Freq: Every day | ORAL | 2 refills | Status: DC
  Filled 2022-10-27 – 2022-12-13 (×2): qty 30, 30d supply, fill #0
  Filled 2023-01-30: qty 30, 30d supply, fill #1
  Filled 2023-03-13: qty 30, 30d supply, fill #2

## 2022-12-13 ENCOUNTER — Other Ambulatory Visit (HOSPITAL_COMMUNITY): Payer: Self-pay

## 2023-01-30 ENCOUNTER — Other Ambulatory Visit (HOSPITAL_COMMUNITY): Payer: Self-pay

## 2023-01-30 DIAGNOSIS — R112 Nausea with vomiting, unspecified: Secondary | ICD-10-CM | POA: Diagnosis not present

## 2023-01-30 DIAGNOSIS — R519 Headache, unspecified: Secondary | ICD-10-CM | POA: Diagnosis not present

## 2023-01-30 DIAGNOSIS — R509 Fever, unspecified: Secondary | ICD-10-CM | POA: Diagnosis not present

## 2023-01-30 DIAGNOSIS — R059 Cough, unspecified: Secondary | ICD-10-CM | POA: Diagnosis not present

## 2023-03-06 DIAGNOSIS — Z6831 Body mass index (BMI) 31.0-31.9, adult: Secondary | ICD-10-CM | POA: Diagnosis not present

## 2023-03-06 DIAGNOSIS — I10 Essential (primary) hypertension: Secondary | ICD-10-CM | POA: Diagnosis not present

## 2023-03-06 DIAGNOSIS — E66811 Obesity, class 1: Secondary | ICD-10-CM | POA: Diagnosis not present

## 2023-03-06 DIAGNOSIS — Z8673 Personal history of transient ischemic attack (TIA), and cerebral infarction without residual deficits: Secondary | ICD-10-CM | POA: Diagnosis not present

## 2023-03-06 DIAGNOSIS — E6609 Other obesity due to excess calories: Secondary | ICD-10-CM | POA: Diagnosis not present

## 2023-03-06 DIAGNOSIS — J449 Chronic obstructive pulmonary disease, unspecified: Secondary | ICD-10-CM | POA: Diagnosis not present

## 2023-03-06 DIAGNOSIS — F4322 Adjustment disorder with anxiety: Secondary | ICD-10-CM | POA: Diagnosis not present

## 2023-03-06 DIAGNOSIS — I2510 Atherosclerotic heart disease of native coronary artery without angina pectoris: Secondary | ICD-10-CM | POA: Diagnosis not present

## 2023-03-06 DIAGNOSIS — F339 Major depressive disorder, recurrent, unspecified: Secondary | ICD-10-CM | POA: Diagnosis not present

## 2023-03-06 DIAGNOSIS — F1090 Alcohol use, unspecified, uncomplicated: Secondary | ICD-10-CM | POA: Diagnosis not present

## 2023-03-13 ENCOUNTER — Other Ambulatory Visit (HOSPITAL_COMMUNITY): Payer: Self-pay

## 2023-04-11 ENCOUNTER — Other Ambulatory Visit (HOSPITAL_COMMUNITY): Payer: Self-pay

## 2023-04-11 MED ORDER — ROSUVASTATIN CALCIUM 40 MG PO TABS
40.0000 mg | ORAL_TABLET | Freq: Every day | ORAL | 11 refills | Status: AC
  Filled 2023-04-11: qty 30, 30d supply, fill #0
  Filled 2023-05-25: qty 30, 30d supply, fill #1
  Filled 2023-08-28: qty 30, 30d supply, fill #2
  Filled 2023-09-28: qty 30, 30d supply, fill #3
  Filled 2023-10-31: qty 30, 30d supply, fill #4
  Filled 2023-12-11: qty 30, 30d supply, fill #5

## 2023-04-11 MED ORDER — ALBUTEROL SULFATE HFA 108 (90 BASE) MCG/ACT IN AERS
INHALATION_SPRAY | RESPIRATORY_TRACT | 5 refills | Status: AC
  Filled 2023-04-11: qty 6.7, 20d supply, fill #0
  Filled 2023-05-25: qty 6.7, 25d supply, fill #1
  Filled 2023-09-28: qty 6.7, 25d supply, fill #2
  Filled 2023-10-31: qty 6.7, 25d supply, fill #3
  Filled 2024-01-15: qty 6.7, 25d supply, fill #4

## 2023-05-25 ENCOUNTER — Other Ambulatory Visit (HOSPITAL_COMMUNITY): Payer: Self-pay

## 2023-08-04 ENCOUNTER — Other Ambulatory Visit: Payer: Self-pay

## 2023-08-04 ENCOUNTER — Inpatient Hospital Stay (HOSPITAL_COMMUNITY)

## 2023-08-04 ENCOUNTER — Emergency Department (HOSPITAL_COMMUNITY)

## 2023-08-04 ENCOUNTER — Encounter (HOSPITAL_COMMUNITY): Payer: Self-pay

## 2023-08-04 ENCOUNTER — Inpatient Hospital Stay (HOSPITAL_COMMUNITY)
Admission: EM | Admit: 2023-08-04 | Discharge: 2023-08-06 | DRG: 062 | Disposition: A | Discharge status: Home / Self Care | Attending: Neurology | Admitting: Neurology

## 2023-08-04 DIAGNOSIS — Z8673 Personal history of transient ischemic attack (TIA), and cerebral infarction without residual deficits: Secondary | ICD-10-CM | POA: Diagnosis not present

## 2023-08-04 DIAGNOSIS — I4510 Unspecified right bundle-branch block: Secondary | ICD-10-CM | POA: Diagnosis present

## 2023-08-04 DIAGNOSIS — E875 Hyperkalemia: Secondary | ICD-10-CM | POA: Diagnosis present

## 2023-08-04 DIAGNOSIS — Z833 Family history of diabetes mellitus: Secondary | ICD-10-CM | POA: Diagnosis not present

## 2023-08-04 DIAGNOSIS — G8324 Monoplegia of upper limb affecting left nondominant side: Secondary | ICD-10-CM | POA: Diagnosis present

## 2023-08-04 DIAGNOSIS — W19XXXA Unspecified fall, initial encounter: Secondary | ICD-10-CM | POA: Diagnosis present

## 2023-08-04 DIAGNOSIS — Z7982 Long term (current) use of aspirin: Secondary | ICD-10-CM | POA: Diagnosis not present

## 2023-08-04 DIAGNOSIS — Z79899 Other long term (current) drug therapy: Secondary | ICD-10-CM | POA: Diagnosis not present

## 2023-08-04 DIAGNOSIS — I639 Cerebral infarction, unspecified: Principal | ICD-10-CM | POA: Diagnosis present

## 2023-08-04 DIAGNOSIS — E785 Hyperlipidemia, unspecified: Secondary | ICD-10-CM | POA: Diagnosis not present

## 2023-08-04 DIAGNOSIS — R2689 Other abnormalities of gait and mobility: Secondary | ICD-10-CM

## 2023-08-04 DIAGNOSIS — Z8249 Family history of ischemic heart disease and other diseases of the circulatory system: Secondary | ICD-10-CM | POA: Diagnosis not present

## 2023-08-04 DIAGNOSIS — R471 Dysarthria and anarthria: Secondary | ICD-10-CM | POA: Diagnosis present

## 2023-08-04 DIAGNOSIS — I69398 Other sequelae of cerebral infarction: Secondary | ICD-10-CM

## 2023-08-04 DIAGNOSIS — I63541 Cerebral infarction due to unspecified occlusion or stenosis of right cerebellar artery: Secondary | ICD-10-CM | POA: Diagnosis not present

## 2023-08-04 DIAGNOSIS — I779 Disorder of arteries and arterioles, unspecified: Secondary | ICD-10-CM | POA: Diagnosis not present

## 2023-08-04 DIAGNOSIS — R519 Headache, unspecified: Secondary | ICD-10-CM | POA: Diagnosis not present

## 2023-08-04 DIAGNOSIS — D72829 Elevated white blood cell count, unspecified: Secondary | ICD-10-CM | POA: Diagnosis not present

## 2023-08-04 DIAGNOSIS — N179 Acute kidney failure, unspecified: Secondary | ICD-10-CM | POA: Diagnosis not present

## 2023-08-04 DIAGNOSIS — F17200 Nicotine dependence, unspecified, uncomplicated: Secondary | ICD-10-CM | POA: Diagnosis not present

## 2023-08-04 DIAGNOSIS — I6389 Other cerebral infarction: Secondary | ICD-10-CM | POA: Diagnosis not present

## 2023-08-04 DIAGNOSIS — Z6831 Body mass index (BMI) 31.0-31.9, adult: Secondary | ICD-10-CM

## 2023-08-04 DIAGNOSIS — F1210 Cannabis abuse, uncomplicated: Secondary | ICD-10-CM | POA: Diagnosis present

## 2023-08-04 DIAGNOSIS — F17210 Nicotine dependence, cigarettes, uncomplicated: Secondary | ICD-10-CM | POA: Diagnosis not present

## 2023-08-04 DIAGNOSIS — G4733 Obstructive sleep apnea (adult) (pediatric): Secondary | ICD-10-CM | POA: Diagnosis not present

## 2023-08-04 DIAGNOSIS — E669 Obesity, unspecified: Secondary | ICD-10-CM | POA: Diagnosis present

## 2023-08-04 DIAGNOSIS — R29702 NIHSS score 2: Secondary | ICD-10-CM | POA: Diagnosis present

## 2023-08-04 DIAGNOSIS — R001 Bradycardia, unspecified: Secondary | ICD-10-CM | POA: Diagnosis present

## 2023-08-04 DIAGNOSIS — F419 Anxiety disorder, unspecified: Secondary | ICD-10-CM | POA: Diagnosis present

## 2023-08-04 DIAGNOSIS — I10 Essential (primary) hypertension: Secondary | ICD-10-CM | POA: Diagnosis not present

## 2023-08-04 DIAGNOSIS — Z888 Allergy status to other drugs, medicaments and biological substances status: Secondary | ICD-10-CM

## 2023-08-04 DIAGNOSIS — Q048 Other specified congenital malformations of brain: Secondary | ICD-10-CM | POA: Diagnosis not present

## 2023-08-04 DIAGNOSIS — Z825 Family history of asthma and other chronic lower respiratory diseases: Secondary | ICD-10-CM

## 2023-08-04 DIAGNOSIS — R29700 NIHSS score 0: Secondary | ICD-10-CM | POA: Diagnosis not present

## 2023-08-04 DIAGNOSIS — Z8711 Personal history of peptic ulcer disease: Secondary | ICD-10-CM

## 2023-08-04 DIAGNOSIS — T81509A Unspecified complication of foreign body accidentally left in body following unspecified procedure, initial encounter: Secondary | ICD-10-CM | POA: Diagnosis not present

## 2023-08-04 LAB — LIPASE, BLOOD: Lipase: 27 U/L (ref 11–51)

## 2023-08-04 LAB — I-STAT CHEM 8, ED
BUN: 24 mg/dL — ABNORMAL HIGH (ref 6–20)
Calcium, Ion: 1.08 mmol/L — ABNORMAL LOW (ref 1.15–1.40)
Chloride: 103 mmol/L (ref 98–111)
Creatinine, Ser: 1.3 mg/dL — ABNORMAL HIGH (ref 0.61–1.24)
Glucose, Bld: 127 mg/dL — ABNORMAL HIGH (ref 70–99)
HCT: 50 % (ref 39.0–52.0)
Hemoglobin: 17 g/dL (ref 13.0–17.0)
Potassium: 6.5 mmol/L (ref 3.5–5.1)
Sodium: 134 mmol/L — ABNORMAL LOW (ref 135–145)
TCO2: 24 mmol/L (ref 22–32)

## 2023-08-04 LAB — TROPONIN I (HIGH SENSITIVITY): Troponin I (High Sensitivity): 2 ng/L (ref <18)

## 2023-08-04 LAB — PROTIME-INR
INR: 1 (ref 0.8–1.2)
Prothrombin Time: 13.7 s (ref 11.4–15.2)

## 2023-08-04 LAB — COMPREHENSIVE METABOLIC PANEL WITH GFR
ALT: 21 U/L (ref 0–44)
AST: 22 U/L (ref 15–41)
Albumin: 4.4 g/dL (ref 3.5–5.0)
Alkaline Phosphatase: 74 U/L (ref 38–126)
Anion gap: 16 — ABNORMAL HIGH (ref 5–15)
BUN: 16 mg/dL (ref 6–20)
CO2: 18 mmol/L — ABNORMAL LOW (ref 22–32)
Calcium: 10 mg/dL (ref 8.9–10.3)
Chloride: 102 mmol/L (ref 98–111)
Creatinine, Ser: 1.03 mg/dL (ref 0.61–1.24)
GFR, Estimated: >60 mL/min (ref >60)
Glucose, Bld: 120 mg/dL — ABNORMAL HIGH (ref 70–99)
Potassium: 4.1 mmol/L (ref 3.5–5.1)
Sodium: 136 mmol/L (ref 135–145)
Total Bilirubin: 0.9 mg/dL (ref 0.0–1.2)
Total Protein: 7.4 g/dL (ref 6.5–8.1)

## 2023-08-04 LAB — HIV ANTIBODY (ROUTINE TESTING W REFLEX): HIV Screen 4th Generation wRfx: NONREACTIVE

## 2023-08-04 LAB — CBC
HCT: 49 % (ref 39.0–52.0)
Hemoglobin: 16.1 g/dL (ref 13.0–17.0)
MCH: 31.1 pg (ref 26.0–34.0)
MCHC: 32.9 g/dL (ref 30.0–36.0)
MCV: 94.6 fL (ref 80.0–100.0)
Platelets: 189 10*3/uL (ref 150–400)
RBC: 5.18 MIL/uL (ref 4.22–5.81)
RDW: 12.8 % (ref 11.5–15.5)
WBC: 12.6 10*3/uL — ABNORMAL HIGH (ref 4.0–10.5)
nRBC: 0 % (ref 0.0–0.2)

## 2023-08-04 LAB — DIFFERENTIAL
Abs Immature Granulocytes: 0.06 10*3/uL (ref 0.00–0.07)
Basophils Absolute: 0 10*3/uL (ref 0.0–0.1)
Basophils Relative: 0 %
Eosinophils Absolute: 0.1 10*3/uL (ref 0.0–0.5)
Eosinophils Relative: 0 %
Immature Granulocytes: 1 %
Lymphocytes Relative: 28 %
Lymphs Abs: 3.5 10*3/uL (ref 0.7–4.0)
Monocytes Absolute: 1.1 10*3/uL — ABNORMAL HIGH (ref 0.1–1.0)
Monocytes Relative: 9 %
Neutro Abs: 7.9 10*3/uL — ABNORMAL HIGH (ref 1.7–7.7)
Neutrophils Relative %: 62 %

## 2023-08-04 LAB — CBG MONITORING, ED
Glucose-Capillary: 113 mg/dL — ABNORMAL HIGH (ref 70–99)
Glucose-Capillary: 115 mg/dL — ABNORMAL HIGH (ref 70–99)

## 2023-08-04 LAB — APTT: aPTT: 31 s (ref 24–36)

## 2023-08-04 LAB — ETHANOL: Alcohol, Ethyl (B): <15 mg/dL (ref <15)

## 2023-08-04 LAB — HEMOGLOBIN A1C
Hgb A1c MFr Bld: 5.9 % — ABNORMAL HIGH (ref 4.8–5.6)
Mean Plasma Glucose: 122.63 mg/dL

## 2023-08-04 LAB — MAGNESIUM: Magnesium: 1.6 mg/dL — ABNORMAL LOW (ref 1.7–2.4)

## 2023-08-04 LAB — MRSA NEXT GEN BY PCR, NASAL: MRSA by PCR Next Gen: NOT DETECTED

## 2023-08-04 MED ORDER — MAGNESIUM SULFATE 2 GM/50ML IV SOLN: 2.0000 g | Freq: Once | INTRAVENOUS | Status: AC

## 2023-08-04 MED ORDER — TENECTEPLASE FOR STROKE
0.2500 mg/kg | PACK | Freq: Once | INTRAVENOUS | Status: AC
  Administered 2023-08-04: 24 mg via INTRAVENOUS
  Filled 2023-08-04: qty 10

## 2023-08-04 MED ORDER — BUPROPION HCL ER (XL) 150 MG PO TB24
300.0000 mg | ORAL_TABLET | Freq: Every day | ORAL | Status: DC
  Administered 2023-08-05 – 2023-08-06 (×2): 300 mg via ORAL
  Filled 2023-08-04: qty 2
  Filled 2023-08-04: qty 1

## 2023-08-04 MED ORDER — LORAZEPAM 2 MG/ML IJ SOLN: 1.0000 mg | Freq: Once | INTRAMUSCULAR | Status: DC

## 2023-08-04 MED ORDER — SODIUM CHLORIDE 0.9 % IV BOLUS
500.0000 mL | Freq: Once | INTRAVENOUS | Status: AC
  Administered 2023-08-04: 500 mL via INTRAVENOUS

## 2023-08-04 MED ORDER — NICOTINE 14 MG/24HR TD PT24
14.0000 mg | MEDICATED_PATCH | Freq: Every day | TRANSDERMAL | Status: DC
  Administered 2023-08-04 – 2023-08-06 (×3): 14 mg via TRANSDERMAL
  Filled 2023-08-04 (×4): qty 1

## 2023-08-04 MED ORDER — ACETAMINOPHEN 650 MG RE SUPP: 650.0000 mg | RECTAL | Status: DC | PRN

## 2023-08-04 MED ORDER — HYDROMORPHONE HCL 1 MG/ML IJ SOLN
0.5000 mg | Freq: Once | INTRAMUSCULAR | Status: AC
  Administered 2023-08-04: 0.5 mg via INTRAVENOUS
  Filled 2023-08-04: qty 1

## 2023-08-04 MED ORDER — SODIUM CHLORIDE 0.9 % IV SOLN
25.0000 mg | Freq: Four times a day (QID) | INTRAVENOUS | Status: DC | PRN
  Administered 2023-08-04: 25 mg via INTRAVENOUS
  Filled 2023-08-04: qty 1

## 2023-08-04 MED ORDER — SCOPOLAMINE 1 MG/3DAYS TD PT72
1.0000 | MEDICATED_PATCH | TRANSDERMAL | Status: DC
  Administered 2023-08-04: 1.5 mg via TRANSDERMAL
  Filled 2023-08-04: qty 1

## 2023-08-04 MED ORDER — IOHEXOL 350 MG/ML SOLN
75.0000 mL | Freq: Once | INTRAVENOUS | Status: AC | PRN
  Administered 2023-08-04: 75 mL via INTRAVENOUS

## 2023-08-04 MED ORDER — CHLORHEXIDINE GLUCONATE CLOTH 2 % EX PADS
6.0000 | MEDICATED_PAD | Freq: Every day | CUTANEOUS | Status: DC
  Administered 2023-08-04: 6 via TOPICAL

## 2023-08-04 MED ORDER — CLEVIDIPINE BUTYRATE 0.5 MG/ML IV EMUL: 0.0000 mg/h | INTRAVENOUS | Status: DC

## 2023-08-04 MED ORDER — STROKE: EARLY STAGES OF RECOVERY BOOK
Freq: Once | Status: AC
  Filled 2023-08-04: qty 1

## 2023-08-04 MED ORDER — ONDANSETRON HCL 4 MG/2ML IJ SOLN
4.0000 mg | Freq: Once | INTRAMUSCULAR | Status: AC
  Administered 2023-08-04: 4 mg via INTRAVENOUS

## 2023-08-04 MED ORDER — SODIUM CHLORIDE 0.9 % IV SOLN: INTRAVENOUS | Status: AC

## 2023-08-04 MED ORDER — IOHEXOL 350 MG/ML SOLN
75.0000 mL | Freq: Once | INTRAVENOUS | Status: AC | PRN
Start: 2023-08-04 — End: 2023-08-04
  Administered 2023-08-04: 75 mL via INTRAVENOUS

## 2023-08-04 MED ORDER — MAGNESIUM SULFATE 2 GM/50ML IV SOLN
INTRAVENOUS | Status: AC
  Administered 2023-08-04: 2 g via INTRAVENOUS
  Filled 2023-08-04: qty 50

## 2023-08-04 MED ORDER — PANTOPRAZOLE SODIUM 40 MG IV SOLR
40.0000 mg | Freq: Every day | INTRAVENOUS | Status: DC
  Administered 2023-08-04: 40 mg via INTRAVENOUS
  Filled 2023-08-04: qty 10

## 2023-08-04 MED ORDER — ACETAMINOPHEN 160 MG/5ML PO SOLN: 650.0000 mg | ORAL | Status: DC | PRN

## 2023-08-04 MED ORDER — LABETALOL HCL 5 MG/ML IV SOLN: 20.0000 mg | Freq: Once | INTRAVENOUS | Status: DC

## 2023-08-04 MED ORDER — ACETAMINOPHEN 325 MG PO TABS
650.0000 mg | ORAL_TABLET | ORAL | Status: DC | PRN
  Administered 2023-08-04 – 2023-08-05 (×4): 650 mg via ORAL
  Filled 2023-08-04 (×4): qty 2

## 2023-08-04 MED ORDER — ROSUVASTATIN CALCIUM 20 MG PO TABS
40.0000 mg | ORAL_TABLET | Freq: Every day | ORAL | Status: DC
  Administered 2023-08-05 – 2023-08-06 (×2): 40 mg via ORAL
  Filled 2023-08-04 (×2): qty 2

## 2023-08-04 MED ORDER — ONDANSETRON HCL 4 MG/2ML IJ SOLN
INTRAMUSCULAR | Status: AC
  Filled 2023-08-04: qty 2

## 2023-08-04 MED ORDER — SODIUM CHLORIDE 0.9 % IV SOLN: INTRAVENOUS | Status: DC

## 2023-08-04 MED ORDER — SENNOSIDES-DOCUSATE SODIUM 8.6-50 MG PO TABS: 1.0000 | ORAL_TABLET | Freq: Every evening | ORAL | Status: DC | PRN

## 2023-08-04 NOTE — ED Notes (Signed)
 Provider made aware about pt vomiting, diaphoretic, and complaining of pain behind eye not resolved with IV dilaudid. Pt taken back for another stat CT

## 2023-08-04 NOTE — Progress Notes (Signed)
 PT Cancellation Note  Patient Details Name: ZAXTON ANGERER MRN: [id number] DOB: [birth year]   Cancelled Treatment:    Reason Eval/Treat Not Completed: Medical issues which prohibited therapy; noted worsening symptoms and further imaging underway.  Will follow up another day when stable.    Marley Simmers 08/04/2023, 1:08 PM Abigail Hoff, PT Acute Rehabilitation Services Office:[phone] 08/04/2023

## 2023-08-04 NOTE — ED Notes (Signed)
 Called to activate a code stroke per dr Martina Sledge

## 2023-08-04 NOTE — ED Notes (Signed)
 Pt c/o worsening h/a behind right eye. Dr Doretta Gant made aware. Taking for stat CT right now

## 2023-08-04 NOTE — Code Documentation (Signed)
 Stroke Response Nurse Documentation Code Documentation  Omar Ward is a 53 y.o. male arriving to [facility]  via walked in on 08/04/23 with past medical hx of prior CVA, smoker HTN. On No antithrombotic. Code stroke was activated by ED.   Patient from work (on [employer] grounds) where he was LKW at [phone] and now complaining of Dizzy, H/A, vomiting.  Stroke team at the bedside on patient arrival. Labs drawn and patient cleared for CT by Dr. Martina Sledge. Patient to CT with team. NIHSS 2, see documentation for details and code stroke times. Patient with left arm weakness and dysarthria  on exam. The following imaging was completed:  CT Head and CTA. Patient is a candidate for IV Thrombolytic due to fixed neurological deficit.. Patient is not a candidate for IR due to no LVO.   Care Plan: NIHSS and BP q 15 min x 2 hr, then q 30 for 6 hrs. Then hourly  Process Delays Noted: IV access  Bedside handoff with ED RN Omar Ward.    Chayanne Speir Livengood  Stroke Response RN

## 2023-08-04 NOTE — Consult Note (Signed)
   NAME:  Omar Ward, MRN:  [id number], DOB:  1970/04/23, LOS: 0 ADMISSION DATE:  08/04/2023, CONSULTATION DATE:  08/04/2023 REFERRING MD:  Doretta Gant - Neurohospitalist, CHIEF COMPLAINT:  projectile emesis   History of Present Illness:  53 year old man who presented with abrupt onset of severe vertigo with nausea and vomiting.  He was also dysarthric and had left arm weakness.  He was found to have an occluded right PICA.  He received thrombolytic with improvement in his symptoms transiently before recurring.  A repeat CT scan showed that the right PICA remained patent. The patient denies chest pain or shortness of breath.  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 Pertinent  Medical History   Past Medical History:  Diagnosis Date   Dyslipidemia    Hernia, abdominal    Hypertension    PUD (peptic ulcer disease)    Sleep apnea    CPAP   Past Surgical History:  Procedure Laterality Date   Knee arthrocopy       Significant Hospital Events: Including procedures, antibiotic start and stop dates in addition to other pertinent events   6/6-received tPA for occluded PICA not amenable to intervention with transient improvement in symptoms.  Interim History / Subjective:  Continues to feel nauseated following 8 mg of Zofran .  Improved somewhat after receiving Phenergan.  Complains of right retro-ocular headache.  Objective    Blood pressure (!) 152/94, pulse 71, temperature (!) 97.5 F (36.4 C), temperature source Oral, resp. rate 15, height 5\' 9"  (1.753 m), weight 95.3 kg, SpO2 100%.       No intake or output data in the 24 hours ending 08/04/23 1417 Filed Weights   08/04/23 0901  Weight: 95.3 kg    Examination: General: Corpulent man.  Appears flushed and unwell. HENT: No head trauma. Lungs: Chest clear to auscultation bilaterally. Cardiovascular: Sounds unremarkable.  In sinus rhythm. Abdomen: Soft  and nontender. Extremities: Normal muscle bulk. Neuro: Awake and alert.  Speech content is normal.  Speech is mildly dysarthric.  There is a new mild right facial droop with sparing of the forehead.  No focal muscle weakness.  Was not able to test for coordination given significant nausea. GU: Deferred.  Ancillary tests personally reviewed:   Assessment and Plan  Recurrence of cerebellar stroke symptoms following apparently successful thrombolytic recanalization of culprit PICA lesion.  Suspect distal embolization following thrombolytic. Known cerebrovascular disease. Hypertension Sleep apnea.  Plan:  - Continue Phenergan/Zofran  for nausea.  Nausea from cerebellar strokes tends to be fairly refractory.  Start scopolamine patch.  Start meclizine. - When able, an MRI should confirm presence of residual stroke. - Remainder of management per neurology.   Arlina Lair, MD [facility] ICU Physician [facility]  Pager: [phone] Mobile: [phone] After hours: [phone].

## 2023-08-04 NOTE — ED Notes (Signed)
 TNK given.

## 2023-08-04 NOTE — ED Triage Notes (Signed)
 Pt was at work and had sudden headache, nausea that started 40 minutes ago. LSN 0745. C/O blurred vision. Denies numbness/ tingling. Denies CP.

## 2023-08-04 NOTE — H&P (Addendum)
 NEUROLOGY H&Ward NOTE   Date of service: August 04, 2023 Patient Name: Omar Ward MRN:  [id number] DOB:  08-Dec-1970 Chief Complaint: "CODE STROKE"  History of Present Illness  Omar Ward is a 53 y.o. 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 CODE STROKE was activated in Triage.  On neurology exam in CT, patient is alert and oriented, no aphasia, mild dysarthria, mild LUE weakness, no sensory deficit or ataxia. CTH negative for bleed.  Management with thrombolytic therapy was explained to the patient as were risks, benefits and alternatives. All questions were answered. Patient expressed understanding of the treatment plan and agreed to proceed with thrombolytic treatment. TNK given @ 0935. CT Angio completed immediately after TNK showed R PICA Occlusion, No intervenable lesion. Patient's dizziness and nausea resolved after administration of TNK (and zofran ).  Delay in TNK administration 2/2 difficulty obtaining IV access  Last known well: 0745 Modified rankin score: 1- minimal imbalance after prior cerebellar stroke IV Thrombolysis: Yes, given @ 0935 Thrombectomy: No, no LVO   NIHSS components Score: Comment  1a Level of Conscious 0[x]  1[]  2[]  3[]      1b LOC Questions 0[x]  1[]  2[]       1c LOC Commands 0[x]  1[]  2[]       2 Best Gaze 0[x]  1[]  2[]       3 Visual 0[x]  1[]  2[]  3[]      4 Facial Palsy 0[x]  1[]  2[]  3[]      5a Motor Arm - left 0[]  1[x]  2[]  3[]  4[]  UN[]    5b Motor Arm - Right 0[x]  1[]  2[]  3[]  4[]  UN[]    6a Motor Leg - Left 0[x]  1[]  2[]  3[]  4[]  UN[]    6b Motor Leg - Right 0[x]  1[]  2[]  3[]  4[]  UN[]    7 Limb Ataxia 0[x]  1[]  2[]  UN[]      8 Sensory 0[x]  1[]  2[]  UN[]      9 Best Language 0[x]  1[]  2[]  3[]      10 Dysarthria 0[]  1[x]  2[]  UN[]      11 Extinct. and Inattention 0[x]  1[]  2[]       TOTAL:   2      ROS  Comprehensive ROS performed and  pertinent positives documented in the HPI  Past History   Past Medical History:  Diagnosis Date   Dyslipidemia    Hernia, abdominal    Hypertension    PUD (peptic ulcer disease)    Sleep apnea    CPAP   Past Surgical History:  Procedure Laterality Date   Knee arthrocopy      Family History  Problem Relation Age of Onset   Diabetes Mother    COPD Mother    Hypertension Father    Diabetes Father    Heart disease Father        Pacemaker   CAD Sister 49       Stent   Social History   Socioeconomic History   Marital status: Married    Spouse name: Not on file   Number of children: Not on file   Years of education: Not on file   Highest education level: Not on file  Occupational History   Not on file  Tobacco Use   Smoking status: Every Day    Current packs/day: 0.50    Types: Cigarettes   Smokeless tobacco: Never   Tobacco comments:    1/2 ppd  Substance and Sexual Activity   Alcohol use: Yes    Alcohol/week: 3.0 - 6.0 standard drinks of alcohol    Types: 3 - 6 Cans of beer per week   Drug use: No   Sexual activity: Not Currently  Other Topics Concern   Not on file  Social History Narrative   Lives with his second wife.  Has farm animals and likes to work on cars. Likes to fish and hunt.  No regular exercise.   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 [employer]    Social Drivers of Health   Financial Resource Strain: Not on file  Food Insecurity: Low Risk  (01/28/2022)   [facility]   Hunger Vital Sign    Worried About Running Out of Food in the Last Year: Never true    Ran Out of Food in the Last Year: Never true  Transportation Needs: Not on file (01/28/2022)  Physical Activity: Not on file  Stress: Not on file  Social Connections: Not on file   Allergies  Allergen Reactions   Norvasc  [Amlodipine ] Other (See Comments)    Lower extremity swelling    Medications   Current  Facility-Administered Medications:    [START ON 08/05/2023]  stroke: early stages of recovery book, , Does not apply, Once, Omar Ward, Omar C, NP   0.9 %  sodium chloride  infusion, , Intravenous, Continuous, Omar Ward, Omar C, NP   acetaminophen  (TYLENOL ) tablet 650 mg, 650 mg, Oral, Q4H PRN **OR** acetaminophen  (TYLENOL ) 160 MG/5ML solution 650 mg, 650 mg, Per Tube, Q4H PRN **OR** acetaminophen  (TYLENOL ) suppository 650 mg, 650 mg, Rectal, Q4H PRN, Omar Ward, Omar C, NP   labetalol (NORMODYNE) injection 20 mg, 20 mg, Intravenous, Once **AND** clevidipine (CLEVIPREX) infusion 0.5 mg/mL, 0-21 mg/hr, Intravenous, Continuous, Omar Ward, Omar C, NP   LORazepam  (ATIVAN ) injection 1 mg, 1 mg, Intravenous, Once, Omar Ward, Omar C, NP   ondansetron  (ZOFRAN ) injection 4 mg, 4 mg, Intravenous, Once, Omar Ward, Omar P, DO   ondansetron  (ZOFRAN ) injection 4 mg, 4 mg, Intravenous, Once, Omar Ward, Omar C, NP   pantoprazole  (PROTONIX ) injection 40 mg, 40 mg, Intravenous, QHS, Omar Ward, Omar C, NP   senna-docusate (Senokot-S) tablet 1 tablet, 1 tablet, Oral, QHS PRN, Omar Ward, Omar C, NP  Current Outpatient Medications:    acetaminophen  (TYLENOL ) 500 MG tablet, Take 1,000 mg by mouth daily as needed for headache (pain)., Disp: , Rfl:    albuterol  (VENTOLIN  HFA) 108 (90 Base) MCG/ACT inhaler, Inhale 2 puffs into the lungs every 6 (six) hours as needed for wheezing, Disp: 6.7 g, Rfl: 0   albuterol  (VENTOLIN  HFA) 108 (90 Base) MCG/ACT inhaler, Inhale 2 puffs into the lungs every 6 (six) hours as needed for wheezing., Disp: 6.7 g, Rfl: 5   Aspirin -Acetaminophen -Caffeine (GOODY HEADACHE PO), Take 2 packets by mouth daily as needed (headache, pain)., Disp: , Rfl:    buPROPion  (WELLBUTRIN  XL) 300 MG 24 hr tablet, Take 1 tablet (300 mg total) by mouth daily., Disp: 30 tablet, Rfl: 2   buPROPion  (WELLBUTRIN  XL) 300 MG 24 hr tablet, Take 1 tablet (300 mg total) by mouth daily., Disp: 30 tablet, Rfl: 2   buPROPion  (WELLBUTRIN  XL) 300 MG 24 hr tablet,  Take 1 tablet (300 mg total) by mouth daily., Disp: 30 tablet, Rfl: 2   cyclobenzaprine  (FLEXERIL ) 10 MG tablet, Take 1 tablet (10 mg total) by mouth at bedtime., Disp: 10 tablet, Rfl: 0   doxepin  (SINEQUAN ) 10  MG capsule, Take 2 capsules (20 mg total) by mouth nightly., Disp: 60 capsule, Rfl: 2   esomeprazole  (NEXIUM ) 20 MG capsule, Take 1 capsule (20 mg total) by mouth daily as needed (For heartburn or acid reflux). (Patient not taking: Reported on 02/05/2022), Disp: 30 capsule, Rfl: 0   hydrochlorothiazide  (HYDRODIURIL ) 25 MG tablet, Take 1 tablet (25 mg total) by mouth daily., Disp: 30 tablet, Rfl: 5   hydrochlorothiazide  (HYDRODIURIL ) 25 MG tablet, Take 1 tablet (25 mg total) by mouth daily., Disp: 30 tablet, Rfl: 5   irbesartan  (AVAPRO ) 300 MG tablet, Take 1 tablet (300 mg total) by mouth daily., Disp: 30 tablet, Rfl: 5   irbesartan  (AVAPRO ) 300 MG tablet, Take 1 tablet (300 mg total) by mouth daily., Disp: 30 tablet, Rfl: 5   MAGNESIUM  PO, Take 1 capsule by mouth daily., Disp: , Rfl:    Multiple Vitamin (MULTIVITAMIN WITH MINERALS) TABS tablet, Take 1 tablet by mouth daily., Disp: , Rfl:    nicotine  (NICODERM CQ  - DOSED IN MG/24 HOURS) 14 mg/24hr patch, Place 1 patch (14 mg total) onto the skin daily., Disp: 28 patch, Rfl: 0   nitroGLYCERIN  (NITROSTAT ) 0.4 MG SL tablet, PLACE 1 TABLET UNDER THE TONGUE EVERY 5 MINUTES AS NEEDED FOR CHEST PAIN. (Patient taking differently: 0.4 mg every 5 (five) minutes as needed for chest pain.), Disp: 25 tablet, Rfl: 0   rosuvastatin  (CRESTOR ) 40 MG tablet, Take 1 tablet (40 mg total) by mouth daily., Disp: 30 tablet, Rfl: 11   Tetrahydrozoline HCl (VISINE OP), Place 1 drop into both eyes daily as needed (dry eyes, itchy eyes)., Disp: , Rfl:    traZODone  (DESYREL ) 50 MG tablet, Take 0.5-1 tablets (25-50 mg total) by mouth at bedtime as needed for sleep, Disp: 30 tablet, Rfl: 0   Vitals   Vitals:   08/04/23 0853 08/04/23 0901  BP: (!) 115/90   Pulse: 72    Resp: 18   Temp: (!) 97.5 F (36.4 Ward)   SpO2: 97%   Weight:  95.3 kg  Height:  5\' 9"  (1.753 m)     Body mass index is 31.01 kg/m.  Physical Exam   Constitutional: Appears well-developed and well-nourished.  Cardiovascular: SB (50s) on monitor Respiratory: Effort normal, non-labored breathing.   Neurologic Examination   Pre-TNK:  Neuro: Mental Status: Patient is awake, alert, oriented to person, place, month, year, and situation. Patient is able to give a clear and coherent history. No signs of aphasia or neglect. Dysarthria present.  Cranial Nerves: II: Visual Fields are full. Pupils are equal, round, and reactive to light.   III,IV, VI: EOMI without ptosis or diploplia.  V: Facial sensation is symmetric to temperature VII: Facial movement is symmetric.  VIII: hearing is intact to voice X: Uvula elevates symmetrically XI: Shoulder shrug is symmetric. XII: tongue is midline without atrophy or fasciculations.  Motor: Tone is normal. Bulk is normal.  LUE mild weakness, 4/5 with mild drift.  5/5 in remaining extremities Sensory: Sensation is symmetric to light touch and temperature in the arms and legs. Cerebellar: FNF and HKS are intact bilaterally  Post-TNK: Dysarthria improved, LUE weakness slightly improved. Dizziness resolved. No other deficits seen.    Labs   CBC:  Recent Labs  Lab 08/04/23 0910 08/04/23 0939  WBC 12.6*  --   NEUTROABS 7.9*  --   HGB 16.1 17.0  HCT 49.0 50.0  MCV 94.6  --   PLT 189  --    Basic Metabolic Panel:  Lab Results  Component Value Date   NA 134 (L) 08/04/2023   K 6.5 (HH) 08/04/2023   CO2 22 02/06/2022   GLUCOSE 127 (H) 08/04/2023   BUN 24 (H) 08/04/2023   CREATININE 1.30 (H) 08/04/2023   CALCIUM  9.4 02/06/2022   GFRNONAA >60 02/06/2022   GFRAA 116 07/02/2019   Lipid Panel:  Lab Results  Component Value Date   LDLCALC 87 02/05/2022   HgbA1c:  Lab Results  Component Value Date   HGBA1C 5.3 02/05/2022    Urine Drug Screen:     Component Value Date/Time   LABOPIA NONE DETECTED 02/06/2022 0840   COCAINSCRNUR NONE DETECTED 02/06/2022 0840   LABBENZ NONE DETECTED 02/06/2022 0840   AMPHETMU NONE DETECTED 02/06/2022 0840   THCU POSITIVE (A) 02/06/2022 0840   LABBARB NONE DETECTED 02/06/2022 0840    Alcohol Level     Component Value Date/Time   ETH <10 02/05/2022 0200   INR  Lab Results  Component Value Date   INR 1.0 02/05/2022   APTT  Lab Results  Component Value Date   APTT 32 02/05/2022    CT Head without contrast(Personally reviewed):  Questionable hyperdense Left PCA.  No acute cortically based infarct or acute intracranial hemorrhage identified.  ASPECTS 10. Chronic cerebellar infarcts   CT angio Head and Neck with contrast(Personally reviewed): Positive for Occluded Right PICA distal to its origin, new since 2023 CTA, although severe atherosclerotic stenosis in the occluded segments at that time. No superimposed large vessel occlusion; chronically occluded left PICA.  No other hemodynamically significant atherosclerotic stenosis in the head or neck.  MRI Brain(Personally reviewed): Ordered for 6/7 am  Assessment   Omar Ward is a 53 y.o. 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  On neurology exam in CT, patient is alert and oriented, no aphasia, mild dysarthria, mild LUE weakness, severe vertigo, headache, diaphoresis  CTH negative for bleed.  TNK given @ 0935. CT Angio showed R PICA Occlusion, No LVO.   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  Patient was previously on ASA/Plavix  for 21 days post-CVA in 2023, then place on ASA. Unsure if still taking.  - DVT ppx - SCDs.  HOLD DVT ppx until 24hour post-TNK imaging is complete and negative for bleed.  - Smoking cessation - will counsel patient - SP goal -  <180/105, PRN labetalol if HR>60 and PRN Hydralazine  if HR<60.  Cleviprex gtt if unable to control with IVP - Telemetry monitoring for arrhythmia  - Swallow screen - will be performed prior to PO intake - Stroke education - will be given - PT/OT/SLP   - Dispo: Adit to ICU for post-TNK monitoring  Stroke team will follow beginning 6/7 AM.   ______________________________________________________________________   Signed, Audrene Lease, NP [facility]    Attending Neurohospitalist Addendum Patient seen and examined with APP/Resident. Agree with the history and physical as documented above. Agree with the plan as documented, which I helped formulate. I have edited  the note above to reflect my full findings and recommendations. I have independently reviewed the chart, obtained history, review of systems and examined the patient.I have personally reviewed pertinent head/neck/spine imaging (CT/MRI). Please feel free to call with any questions.  53 yo man with hx prior L PICA stroke with minimal residual imbalance Ward/w acute onset severe persistent vertigo LKW 0745. 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 When sx began he fell to the ground and was so dizzy he had to crawl. Vomiting on arrival. New R PICA occlusion on CTA.  Risks, benefits, and alternatives to TNK were discussed with patient who gave informed consent to proceed.  CT head was reviewed by me prior to TNK administration showed no evidence of intracranial hemorrhage. his symptoms subsequently markedly improved after TNK.  His  dizziness resolved, his headache improved, his diaphoresis resolved, his nausea improved (also received 8 milligrams of Zofran ).  Subsequently patient developed recurrent severe headache 10 out of 10 and I took him for stat head CT which showed no evidence of intracranial hemorrhage post TNK.  He did not respond to Tylenol  and I given him 0.5 mg of Dilaudid for headache.  He subsequently developed recurrent vertigo of equivalent severity to presentation, intractable vomiting, worsening dysarthria.  I took him for repeat head CT again which was again negative for intracranial hemorrhage.  Due to recurrence of symptoms localizing to the posterior circulation I also repeated CTA which showed no evidence of LVO, no evidence of new occlusion, and actually showed resolution of the right PICA occlusion seen earlier.  He did not report chest pain but was extremely diaphoretic.  He had a right bundle branch block on EKG but no ST elevation.  Initial troponin was normal.  He was mildly bradycardic on arrival heart rate of 59 but did bradycardia down to heart rate of 40 in CT.  I was concerned for concurrent cardiac ischemia or other etiology for his severe recurrent symptoms despite resolution of acute cerebrovascular occlusion on repeat CTA.  I consulted CCM to weigh in and they agreed that his symptoms all appear to be secondary to posterior circulation ischemia.  His nausea did not improve after maximum dose of Zofran  and subsequent administration of Phenergan.  Subsequently scopolamine patch was placed with significant improvement in nausea.  This patient is critically ill and at significant risk of neurological worsening, death and care requires constant monitoring of vital signs, hemodynamics,respiratory and cardiac monitoring, neurological assessment, discussion with family, other specialists and medical decision making of high complexity. I spent 110 minutes of neurocritical care time  in the care of  this patient.  This was time spent independent of any time provided by nurse practitioner or PA.  Greg Leaks, MD [facility] [phone]  If 7pm- 7am, please page neurology on call as listed in AMION.

## 2023-08-04 NOTE — ED Provider Notes (Signed)
 [place] EMERGENCY DEPARTMENT AT [facility] Provider Note   CSN: [id number] Arrival date & time: 08/04/23  8416  An emergency department physician performed an initial assessment on this suspected stroke patient at [phone].  History  Chief Complaint  Patient presents with   Nausea   Headache   Code Stroke    Omar Ward is a 53 y.o. male.  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  Similar symptoms with his previous CVA.  Symptoms started at 8:30 AM this morning.  Last seen normal 7:45 AM.  Patient denies any facial droop, speech changes, motor dysfunction, or sensation dysfunction.  Unsure of blood thinner use-review demonstrates no blood thinners.  Actively vomiting on exam.  Admits to severe headache.  Denies any recent falls or head trauma.  The history is provided by the patient. No language interpreter was used.  Headache Associated symptoms: dizziness   Associated symptoms: no abdominal pain, no back pain, no cough, no ear pain, no eye pain, no fever, no seizures, no sore throat and no vomiting        Home Medications Prior to Admission medications   Medication Sig Start Date End Date Taking? Authorizing Provider  albuterol  (VENTOLIN  HFA) 108 (90 Base) MCG/ACT inhaler Inhale 2 puffs into the lungs every 6 (six) hours as needed for wheezing. 04/11/23  Yes   buPROPion  (WELLBUTRIN  XL) 300 MG 24 hr tablet Take 1 tablet (300 mg total) by mouth daily. 06/10/22  Yes   hydrochlorothiazide  (HYDRODIURIL ) 25 MG tablet Take 1 tablet (25 mg total) by mouth daily. 06/10/22  Yes   irbesartan  (AVAPRO ) 300 MG tablet Take 1 tablet (300 mg total) by mouth daily. 06/10/22  Yes   rosuvastatin  (CRESTOR ) 40 MG tablet Take 1 tablet (40 mg total) by mouth daily. 04/11/23  Yes   hydrochlorothiazide  (HYDRODIURIL ) 25 MG  tablet Take 1 tablet (25 mg total) by mouth daily. Patient not taking: Reported on 08/04/2023 06/10/22     irbesartan  (AVAPRO ) 300 MG tablet Take 1 tablet (300 mg total) by mouth daily. Patient not taking: Reported on 08/04/2023 06/10/22     nitroGLYCERIN  (NITROSTAT ) 0.4 MG SL tablet PLACE 1 TABLET UNDER THE TONGUE EVERY 5 MINUTES AS NEEDED FOR CHEST PAIN. Patient not taking: Reported on 08/04/2023 12/02/19 04/09/22  Sherian Dimitri, FNP  escitalopram  (LEXAPRO ) 10 MG tablet Take 1 tablet (10 mg total) by mouth Once Daily. 04/25/22 05/30/22        Allergies    Norvasc  [amlodipine ]    Review of Systems   Review of Systems  Constitutional:  Negative for chills and fever.  HENT:  Negative for ear pain and sore throat.   Eyes:  Negative for pain and visual disturbance.  Respiratory:  Negative for cough and shortness of breath.   Cardiovascular:  Negative for chest pain and palpitations.  Gastrointestinal:  Negative for abdominal pain and vomiting.  Genitourinary:  Negative for dysuria and hematuria.  Musculoskeletal:  Negative for arthralgias and back pain.  Skin:  Negative for color change and rash.  Neurological:  Positive for dizziness and headaches. Negative for seizures and syncope.  All other systems reviewed and are negative.   Physical Exam Updated Vital Signs BP 116/73   Pulse 70   Temp 98.1 F (36.7 C) (Oral)   Resp 18   Ht 5\' 9"  (1.753 m)  Wt 95.3 kg   SpO2 (!) 86%   BMI 31.01 kg/m  Physical Exam Vitals and nursing note reviewed.  Constitutional:      General: He is not in acute distress.    Appearance: He is well-developed.  HENT:     Head: Normocephalic and atraumatic.  Eyes:     Conjunctiva/sclera: Conjunctivae normal.  Cardiovascular:     Rate and Rhythm: Normal rate and regular rhythm.     Heart sounds: No murmur heard. Pulmonary:     Effort: Pulmonary effort is normal. No respiratory distress.     Breath sounds: Normal breath sounds.  Abdominal:     Palpations:  Abdomen is soft.     Tenderness: There is no abdominal tenderness.  Musculoskeletal:        General: No swelling.     Cervical back: Neck supple.  Skin:    General: Skin is warm and dry.     Capillary Refill: Capillary refill takes less than 2 seconds.  Neurological:     Mental Status: He is alert and oriented to person, place, and time.     GCS: GCS eye subscore is 4. GCS verbal subscore is 5. GCS motor subscore is 6.     Cranial Nerves: Cranial nerves 2-12 are intact.     Sensory: Sensation is intact.     Motor: Motor function is intact.     Comments: Patient actively vomiting on exam due to dizziness. Extra ocular eye movements intact.  Unable to perform vision exam due to active vomiting.   PERRLA. Sensation or motor dysfunction. No facial symmetry.  Psychiatric:        Mood and Affect: Mood normal.     ED Results / Procedures / Treatments   Labs (all labs ordered are listed, but only abnormal results are displayed) Labs Reviewed  COMPREHENSIVE METABOLIC PANEL WITH GFR - Abnormal; Notable for the following components:      Result Value   CO2 18 (*)    Glucose, Bld 120 (*)    Anion gap 16 (*)    All other components within normal limits  CBC - Abnormal; Notable for the following components:   WBC 12.6 (*)    All other components within normal limits  DIFFERENTIAL - Abnormal; Notable for the following components:   Neutro Abs 7.9 (*)    Monocytes Absolute 1.1 (*)    All other components within normal limits  HEMOGLOBIN A1C - Abnormal; Notable for the following components:   Hgb A1c MFr Bld 5.9 (*)    All other components within normal limits  MAGNESIUM  - Abnormal; Notable for the following components:   Magnesium  1.6 (*)    All other components within normal limits  I-STAT CHEM 8, ED - Abnormal; Notable for the following components:   Sodium 134 (*)    Potassium 6.5 (*)    BUN 24 (*)    Creatinine, Ser 1.30 (*)    Glucose, Bld 127 (*)    Calcium , Ion 1.08 (*)     All other components within normal limits  CBG MONITORING, ED - Abnormal; Notable for the following components:   Glucose-Capillary 113 (*)    All other components within normal limits  CBG MONITORING, ED - Abnormal; Notable for the following components:   Glucose-Capillary 115 (*)    All other components within normal limits  MRSA NEXT GEN BY PCR, NASAL  LIPASE, BLOOD  ETHANOL  PROTIME-INR  APTT  HIV ANTIBODY (ROUTINE TESTING W REFLEX)  URINALYSIS, ROUTINE W REFLEX MICROSCOPIC  RAPID URINE DRUG SCREEN, HOSP PERFORMED  LIPID PANEL  CBC  COMPREHENSIVE METABOLIC PANEL WITH GFR  MAGNESIUM   TROPONIN I (HIGH SENSITIVITY)    EKG None  Radiology CT ANGIO HEAD NECK W WO CM (CODE STROKE) Result Date: 08/04/2023 CLINICAL DATA:  53 year old male status post code stroke presentation and TNK this morning for suspected acute right PICA occlusion (superimposed on chronic severe right PICA atherosclerosis and chronic left PICA occlusion. Waxing and waning symptoms. Subsequent encounter. EXAM: CT ANGIOGRAPHY HEAD AND NECK WITH AND WITHOUT CONTRAST TECHNIQUE: Multidetector CT imaging of the head and neck was performed using the standard protocol during bolus administration of intravenous contrast. Multiplanar CT image reconstructions and MIPs were obtained to evaluate the vascular anatomy. Carotid stenosis measurements (when applicable) are obtained utilizing NASCET criteria, using the distal internal carotid diameter as the denominator. RADIATION DOSE REDUCTION: This exam was performed according to the departmental dose-optimization program which includes automated exposure control, adjustment of the mA and/or kV according to patient size and/or use of iterative reconstruction technique. CONTRAST:  75mL OMNIPAQUE  IOHEXOL  350 MG/ML SOLN COMPARISON:  CTA at 0942 hours today. FINDINGS: CTA NECK Skeleton: Stable. Upper chest: Stable, negative. Other neck: Nonvascular neck soft tissue spaces appears stable,  negative; interestingly the right CCA and right carotid bifurcation have a more retropharyngeal course compared to earlier today (series 7, image 120). Aortic arch: Stable 3 vessel arch with mild calcified atherosclerosis. Right carotid system: Stable mild atherosclerosis without stenosis. Left carotid system: Stable mild atherosclerosis without stenosis. Vertebral arteries: Stable. Both vertebral arteries remain patent to the skull base without significant stenosis. The left vertebral is mildly dominant as before. CTA HEAD Posterior circulation: Pronounced improved patency of the right PICA compared to earlier today. See series 12, image 22. Underlying chronic moderate to severe atherosclerotic irregularity in that segment now highly resembles the CTA appearance on 02/05/2022. Contralateral left PICA remains occluded, chronic. Distal vertebral arteries, vertebrobasilar junction, basilar artery, SCA and PCA origins appear stable and patent without significant stenosis. Bilateral PCA branches are stable and within normal limits. Anterior circulation: Patent, stable since 0942 hours today with no significant stenosis. Venous sinuses: Similar early contrast timing, grossly patent. Anatomic variants: Mildly dominant left vertebral artery. Review of the MIP images confirms the above findings IMPRESSION: 1. Improved patency of the Right PICA from 0942 hours today status post TNK. Underlying chronic moderate to severe atherosclerotic irregularity in that vessel stable from 2023 CTA. 2. No new CTA finding.  Chronic Left PICA occlusion. These results were communicated to Dr. Doretta Gant at 12:38 pm on 08/04/2023. Electronically Signed   By: Marlise Simpers M.D.   On: 08/04/2023 12:39   CT HEAD WO CONTRAST ( ) Result Date: 08/04/2023 CLINICAL DATA:  53 year old male code stroke presentation this morning with evidence of acute right PICA occlusion. Waxing and waning symptoms following T NK, severe headache and diaphoresis now. EXAM: CT  HEAD WITHOUT CONTRAST TECHNIQUE: Contiguous axial images were obtained from the base of the skull through the vertex without intravenous contrast. RADIATION DOSE REDUCTION: This exam was performed according to the departmental dose-optimization program which includes automated exposure control, adjustment of the mA and/or kV according to patient size and/or use of iterative reconstruction technique. COMPARISON:  Head CTs 1120 hours today and earlier. FINDINGS: Brain: Cerebellum and posterior fossa CT appearance is stable from earlier today. No acute intracranial hemorrhage, mass effect, or convincing cytotoxic edema. Chronic left greater than right cerebellar infarcts. No ventriculomegaly. Supratentorial  Alli Jasmer-white differentiation also stable, notable for right posterior temporal lobe developmental venous anomaly with associated curvilinear hyperdensity series 2, image 14. Vascular: Less residual intravascular contrast now. No new finding is evident. Skull: Stable and intact. Sinuses/Orbits: Visualized paranasal sinuses and mastoids are stable and well aerated. Other: Stable orbit and scalp soft tissues. IMPRESSION: Continued stable non contrast CT appearance of the brain since code stroke presentation this morning. No acute intracranial hemorrhage, mass effect, or cytotoxic edema identified. Study discussed by telephone with Dr. Greg Leaks on 08/04/2023 at 1216 hours. Electronically Signed   By: Marlise Simpers M.D.   On: 08/04/2023 12:31   CT HEAD WO CONTRAST ( ) Result Date: 08/04/2023 EXAM: CT HEAD WITHOUT CONTRAST 08/04/2023 09:24:00 AM TECHNIQUE: CT of the head was performed without the administration of intravenous contrast. Automated exposure control, iterative reconstruction, and/or weight based adjustment of the mA/kV was utilized to reduce the radiation dose to as low as reasonably achievable. COMPARISON: None available. CLINICAL HISTORY: Stroke, follow up; s/p TNK now severe headache r/o hemorrhagic  conversion. Right PICA occlusion. FINDINGS: BRAIN AND VENTRICLES: There is no acute intracranial hemorrhage, mass effect or midline shift. No abnormal extra-axial fluid collection. The Kain Milosevic-white differentiation is maintained without an acute infarct. There is no hydrocephalus. Mild diffuse white matter changes are stable. Remote lacunar infarcts are again seen in the cerebellum bilaterally. No acute hemorrhage or progressive infarction is present. ORBITS: The visualized portion of the orbits demonstrate no acute abnormality. SINUSES: The visualized paranasal sinuses and mastoid air cells demonstrate no acute abnormality. SOFT TISSUES AND SKULL: No acute abnormality of the visualized skull or soft tissues. IMPRESSION: 1. No acute hemorrhage or progressive infarction. 2. Remote lacunar infarcts in the cerebellum bilaterally. 3. Stable mild diffuse white matter changes. Electronically signed by: Audree Leas MD 08/04/2023 11:47 AM EDT RP Workstation: [id number]   CT ANGIO HEAD NECK W WO CM (CODE STROKE) Result Date: 08/04/2023 CLINICAL DATA:  53 year old male code stroke presentation with worsening symptoms. EXAM: CT ANGIOGRAPHY HEAD AND NECK WITH AND WITHOUT CONTRAST TECHNIQUE: Multidetector CT imaging of the head and neck was performed using the standard protocol during bolus administration of intravenous contrast. Multiplanar CT image reconstructions and MIPs were obtained to evaluate the vascular anatomy. Carotid stenosis measurements (when applicable) are obtained utilizing NASCET criteria, using the distal internal carotid diameter as the denominator. RADIATION DOSE REDUCTION: This exam was performed according to the departmental dose-optimization program which includes automated exposure control, adjustment of the mA and/or kV according to patient size and/or use of iterative reconstruction technique. CONTRAST:  75 mL Omnipaque  350 COMPARISON:  Plain head CT today. Prior CTA head and neck 02/05/2022.  FINDINGS: CTA NECK Skeleton: No acute osseous abnormality. Mild for age cervical spine degeneration. Occasional dental caries. Upper chest: Negative. Other neck: Nonvascular neck soft tissue spaces appear stable and within normal limits. Aortic arch: 3 vessel arch.  Calcified aortic atherosclerosis. Right carotid system: Patent with mild for age atherosclerosis, no significant stenosis. Left carotid system: Patent with mild atherosclerosis, no significant stenosis. Vertebral arteries: Proximal right subclavian artery plaque without stenosis. Right vertebral artery origin remains within normal limits. Mildly non dominant right vertebral artery appears patent to the skull base, stable since 2023 without focal stenosis. No significant proximal left subclavian or left vertebral artery origin plaque or stenosis. Left vertebral artery remains somewhat dominant, patent to the skull base with no significant plaque or stenosis. CTA HEAD Posterior circulation: Distal vertebral arteries, vertebrobasilar junction, basilar artery appear stable since 2023,  patent without significant stenosis. SCA and PCA origins are patent. Posterior communicating arteries are diminutive or absent. Bilateral PCA branches are patent and stable, up to mild associated bilateral PCA atherosclerotic irregularity. Chronically occluded left PICA and the right PICA now appears occluded in its mid to distal segments although the right PICA origin remains patent on series 6, image 185. No poor enhancement of the right PICA now on series 12, image 25 and compare to Severe stenoses in some of the same segments on series 16, image 21 of the 2023 CTA. Preliminary report of the above discussed by telephone with Dr. Doretta Gant on 08/04/2023 at 09:48 . Anterior circulation: Both ICA siphons are patent. Supraclinoid right ICA calcified plaque but no significant ICA siphon stenosis. Patent carotid termini. MCA and ACA origins are patent without stenosis. Diminutive or  absent anterior communicating artery. Bilateral ACA branches are stable and within normal limits. Bilateral MCA branches are stable and within normal limits. Venous sinuses: Early contrast timing, superior sagittal sinus is patent. Anatomic variants: Mildly dominant left vertebral artery. Review of the MIP images confirms the above findings IMPRESSION: 1. Positive for Occluded Right PICA distal to its origin, new since 2023 CTA, although severe atherosclerotic stenosis in the occluded segments at that time. 2. No superimposed large vessel occlusion; chronically occluded left PICA. No other hemodynamically significant atherosclerotic stenosis in the head or neck. 3.  Aortic Atherosclerosis (ICD10-I70.0). Salient findings discussed by telephone with Dr. Doretta Gant on 08/04/2023 at 09:48 . Electronically Signed   By: Marlise Simpers M.D.   On: 08/04/2023 09:58   CT HEAD CODE STROKE WO CONTRAST Result Date: 08/04/2023 CLINICAL DATA:  Code stroke. 53 year old male with headache and blurred vision. EXAM: CT HEAD WITHOUT CONTRAST TECHNIQUE: Contiguous axial images were obtained from the base of the skull through the vertex without intravenous contrast. RADIATION DOSE REDUCTION: This exam was performed according to the departmental dose-optimization program which includes automated exposure control, adjustment of the mA and/or kV according to patient size and/or use of iterative reconstruction technique. COMPARISON:  Brain MRI, Head CT 02/05/2022. FINDINGS: Brain: Chronic left greater than right cerebellar infarcts with expected evolution since 02/05/2022. 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 No midline shift, ventriculomegaly, mass effect, evidence of mass lesion, intracranial hemorrhage or evidence of cortically based acute infarction. Supratentorial Neisha Hinger-white differentiation appears stable since 2023 and normal for age. Vascular:  Questionable hyperdense left PCA on coronal image 38. Skull: Stable and intact. Sinuses/Orbits: Visualized paranasal sinuses and mastoids are stable and well aerated. Other: No gaze deviation. Stable orbit and scalp soft tissues including punctate retained metallic foreign body overlying the left frontal sinus outside the skull series 5, image 41. ASPECTS [facility] Stroke Program Early CT Score) Total score (0-10 with 10 being normal): 10 IMPRESSION: 1. Questionable hyperdense Left PCA. No acute cortically based infarct or acute intracranial hemorrhage identified. ASPECTS 10. 2. Chronic cerebellar infarcts. 3. These results were communicated to Dr. Doretta Gant at 9:34 am on 08/04/2023 by text page via the [facility] messaging system. Electronically Signed   By: Marlise Simpers M.D.   On: 08/04/2023 09:34    Procedures .Critical Care  Performed by: Quinn Bucco, DO Authorized by: Quinn Bucco, DO   Critical care provider statement:    Critical care time (minutes):  75   Critical care was time spent personally by me on the following activities:  Development of treatment plan with patient or surrogate, discussions  with consultants, evaluation of patient's response to treatment, examination of patient, ordering and review of laboratory studies, ordering and review of radiographic studies, ordering and performing treatments and interventions, pulse oximetry, re-evaluation of patient's condition and review of old charts   Care discussed with comment:  Neurology     Medications Ordered in ED Medications   stroke: early stages of recovery book (has no administration in time range)  0.9 %  sodium chloride  infusion (0 mLs Intravenous Stopped 08/04/23 1300)  acetaminophen  (TYLENOL ) tablet 650 mg (650 mg Oral Given 08/04/23 1657)    Or  acetaminophen  (TYLENOL ) 160 MG/5ML solution 650 mg ( Per Tube See Alternative 08/04/23 1657)    Or  acetaminophen  (TYLENOL ) suppository 650 mg ( Rectal See Alternative 08/04/23 1657)   senna-docusate (Senokot-S) tablet 1 tablet (has no administration in time range)  pantoprazole  (PROTONIX ) injection 40 mg (has no administration in time range)  labetalol (NORMODYNE) injection 20 mg (0 mg Intravenous Hold 08/04/23 1017)    And  clevidipine (CLEVIPREX) infusion 0.5 mg/mL (0 mg/hr Intravenous Hold 08/04/23 1017)  LORazepam  (ATIVAN ) injection 1 mg (0 mg Intravenous Hold 08/04/23 1009)  nicotine  (NICODERM CQ  - dosed in mg/24 hours) patch 14 mg (14 mg Transdermal Patch Applied 08/04/23 1140)  promethazine (PHENERGAN) 25 mg in sodium chloride  0.9 % 50 mL IVPB (0 mg Intravenous Stopped 08/04/23 1253)  scopolamine (TRANSDERM-SCOP) 1 MG/3DAYS 1.5 mg (1.5 mg Transdermal Patch Applied 08/04/23 1316)  0.9 %  sodium chloride  infusion (0 mLs Intravenous Stopped 08/04/23 1658)  Chlorhexidine Gluconate Cloth 2 % PADS 6 each (6 each Topical Given 08/04/23 1530)  ondansetron  (ZOFRAN ) injection 4 mg (4 mg Intravenous Given 08/04/23 0946)  tenecteplase (TNKASE) injection for Stroke 24 mg (24 mg Intravenous Given 08/04/23 0935)  iohexol  (OMNIPAQUE ) 350 MG/ML injection 75 mL (75 mLs Intravenous Contrast Given 08/04/23 0942)  ondansetron  (ZOFRAN ) injection 4 mg (4 mg Intravenous Given 08/04/23 0920)  HYDROmorphone (DILAUDID) injection 0.5 mg (0.5 mg Intravenous Given 08/04/23 1140)  magnesium  sulfate IVPB 2 g 50 mL (0 g Intravenous Stopped 08/04/23 1335)  iohexol  (OMNIPAQUE ) 350 MG/ML injection 75 mL (75 mLs Intravenous Contrast Given 08/04/23 1224)  sodium chloride  0.9 % bolus 500 mL (0 mLs Intravenous Stopped 08/04/23 1335)    ED Course/ Medical Decision Making/ A&P                                 Medical Decision Making Amount and/or Complexity of Data Reviewed Labs: ordered. Radiology: ordered.  Risk Prescription drug management. Decision regarding hospitalization.   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   LKN 7:41 AM Symptom onset approximately 8:30 AM  Code stroke activated.  Patient taken directly down to CT.  Dr. Veleta Gerold at bedside. CTH had stable.  Line placed.  Zofran  given.  Labs drawn.  CTA head and neck ordered. Concern for right PICA occulusion. TNK given by neurology team. Pt stable and maintaining  airway. Admitted under neurology service.        Final Clinical Impression(s) / ED Diagnoses Final diagnoses:  Ischemic stroke [facility])    Rx / DC Orders ED Discharge Orders     None         Quinn Bucco, DO 08/04/23 2037

## 2023-08-04 NOTE — Progress Notes (Signed)
 PHARMACIST CODE STROKE RESPONSE  Notified to mix TNK at 0934 by Dr. Doretta Gant TNK preparation completed at [phone] and administered at 0935  TNK dose = 24 mg IV over 5 seconds  Issues/delays encountered (if applicable): N/a  Omar Ward 08/04/23 9:36 AM

## 2023-08-04 NOTE — Progress Notes (Signed)
 OT Cancellation Note  Patient Details Name: KYDEN POTASH MRN: [id number] DOB: 08/19/1970   Cancelled Treatment:    Reason Eval/Treat Not Completed: Medical issues which prohibited therapy.  Pt vomiting, diaphoretic with additional scan ordered.  Continue efforts as appropriate.    Daulton Harbaugh D Hameed Kolar 08/04/2023, 1:26 PM 08/04/2023  RP, OTR/L  Acute Rehabilitation Services  Office:  [phone]

## 2023-08-05 ENCOUNTER — Inpatient Hospital Stay (HOSPITAL_COMMUNITY)

## 2023-08-05 DIAGNOSIS — I779 Disorder of arteries and arterioles, unspecified: Secondary | ICD-10-CM

## 2023-08-05 DIAGNOSIS — I6389 Other cerebral infarction: Secondary | ICD-10-CM | POA: Diagnosis not present

## 2023-08-05 DIAGNOSIS — I63541 Cerebral infarction due to unspecified occlusion or stenosis of right cerebellar artery: Secondary | ICD-10-CM | POA: Diagnosis not present

## 2023-08-05 DIAGNOSIS — I10 Essential (primary) hypertension: Secondary | ICD-10-CM | POA: Diagnosis not present

## 2023-08-05 DIAGNOSIS — R29700 NIHSS score 0: Secondary | ICD-10-CM | POA: Diagnosis not present

## 2023-08-05 DIAGNOSIS — F17210 Nicotine dependence, cigarettes, uncomplicated: Secondary | ICD-10-CM

## 2023-08-05 DIAGNOSIS — N179 Acute kidney failure, unspecified: Secondary | ICD-10-CM

## 2023-08-05 DIAGNOSIS — F1210 Cannabis abuse, uncomplicated: Secondary | ICD-10-CM

## 2023-08-05 DIAGNOSIS — E785 Hyperlipidemia, unspecified: Secondary | ICD-10-CM

## 2023-08-05 LAB — CBC
HCT: 44.7 % (ref 39.0–52.0)
Hemoglobin: 14.9 g/dL (ref 13.0–17.0)
MCH: 31.6 pg (ref 26.0–34.0)
MCHC: 33.3 g/dL (ref 30.0–36.0)
MCV: 94.9 fL (ref 80.0–100.0)
Platelets: 162 10*3/uL (ref 150–400)
RBC: 4.71 MIL/uL (ref 4.22–5.81)
RDW: 12.9 % (ref 11.5–15.5)
WBC: 13.7 10*3/uL — ABNORMAL HIGH (ref 4.0–10.5)
nRBC: 0 % (ref 0.0–0.2)

## 2023-08-05 LAB — BASIC METABOLIC PANEL WITH GFR
Anion gap: 7 (ref 5–15)
BUN: 11 mg/dL (ref 6–20)
CO2: 25 mmol/L (ref 22–32)
Calcium: 8.5 mg/dL — ABNORMAL LOW (ref 8.9–10.3)
Chloride: 103 mmol/L (ref 98–111)
Creatinine, Ser: 0.86 mg/dL (ref 0.61–1.24)
GFR, Estimated: >60 mL/min
Glucose, Bld: 100 mg/dL — ABNORMAL HIGH (ref 70–99)
Potassium: 3.9 mmol/L (ref 3.5–5.1)
Sodium: 135 mmol/L (ref 135–145)

## 2023-08-05 LAB — ECHOCARDIOGRAM COMPLETE
AR max vel: 4 cm2
AV Peak grad: 3.7 mmHg
Ao pk vel: 0.97 m/s
Area-P 1/2: 3.17 cm2
Height: 69 in
S' Lateral: 2.9 cm
Weight: 3360 [oz_av]

## 2023-08-05 LAB — RAPID URINE DRUG SCREEN, HOSP PERFORMED
Amphetamines: NOT DETECTED
Barbiturates: NOT DETECTED
Benzodiazepines: NOT DETECTED
Cocaine: NOT DETECTED
Opiates: NOT DETECTED
Tetrahydrocannabinol: POSITIVE — AB

## 2023-08-05 LAB — LIPID PANEL
Cholesterol: 187 mg/dL (ref 0–200)
HDL: 27 mg/dL — ABNORMAL LOW (ref >40)
LDL Cholesterol: 136 mg/dL — ABNORMAL HIGH (ref 0–99)
Total CHOL/HDL Ratio: 6.9 ratio
Triglycerides: 118 mg/dL (ref <150)
VLDL: 24 mg/dL (ref 0–40)

## 2023-08-05 LAB — MAGNESIUM: Magnesium: 1.8 mg/dL (ref 1.7–2.4)

## 2023-08-05 MED ORDER — NICOTINE POLACRILEX 2 MG MT GUM: 2.0000 mg | CHEWING_GUM | OROMUCOSAL | Status: DC | PRN

## 2023-08-05 MED ORDER — ASPIRIN 81 MG PO TBEC
81.0000 mg | DELAYED_RELEASE_TABLET | Freq: Every day | ORAL | Status: DC
  Administered 2023-08-05 – 2023-08-06 (×2): 81 mg via ORAL
  Filled 2023-08-05 (×2): qty 1

## 2023-08-05 MED ORDER — EZETIMIBE 10 MG PO TABS
10.0000 mg | ORAL_TABLET | Freq: Every day | ORAL | Status: DC
  Administered 2023-08-05 – 2023-08-06 (×2): 10 mg via ORAL
  Filled 2023-08-05 (×2): qty 1

## 2023-08-05 MED ORDER — CLOPIDOGREL BISULFATE 75 MG PO TABS
75.0000 mg | ORAL_TABLET | Freq: Every day | ORAL | Status: DC
  Administered 2023-08-05 – 2023-08-06 (×2): 75 mg via ORAL
  Filled 2023-08-05 (×2): qty 1

## 2023-08-05 NOTE — Evaluation (Addendum)
 Physical Therapy Evaluation Patient Details Name: Omar Ward MRN: [id number] DOB: 1970-09-08 Today's Date: 08/05/2023  History of Present Illness  Pt is a 53 y.o. male presenting with dizziness, nausea/vomiting, L weakness, dysarthria. CTH negative for bleed; s/p TNK. MRI showed R PICA Occlusion. MRI 6/7 confirmed 3-4 cm Right cerebellar infarct. 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    Clinical Impression  Pt in bed upon arrival of PT, agreeable to evaluation at this time. Prior to admission the pt was completely independent, living with his spouse in a home with 1 step to enter, working maintenance. The pt was able to complete bed mobility without assist or increased time, completed all sit-stand, hallway ambulation, and stairs with supervision to CGA and without DME. VSS throughout. Pt denies any dizziness and presents with strength equal between LE, mild incoordination noted initially with toe-tap activity in LLE, but improved after 5 reps to no deficits noticed. Pt reports he has assist as needed at home, feels to be at his functional baseline, and is safe to return home without follow up PT once medically stable for d/c.      If plan is discharge home, recommend the following: Assistance with cooking/housework;Assist for transportation;Help with stairs or ramp for entrance   Can travel by private vehicle        Equipment Recommendations None recommended by PT  Recommendations for Other Services       Functional Status Assessment Patient has not had a recent decline in their functional status     Precautions / Restrictions Precautions Precautions: Fall Recall of Precautions/Restrictions: Intact      Mobility  Bed Mobility Overal bed mobility: Independent                  Transfers Overall transfer level: Needs assistance Equipment used: None Transfers: Sit to/from Stand Sit to Stand:  Supervision           General transfer comment: stable, assist for line management only    Ambulation/Gait Ambulation/Gait assistance: Supervision Gait Distance (Feet): 200 Feet Assistive device: None Gait Pattern/deviations: WFL(Within Functional Limits) Gait velocity: WFL Gait velocity interpretation: 1.31 - 2.62 ft/sec, indicative of limited community ambulator   General Gait Details: slightly slowed, pt reports at baseline. stable with balance challenge  Stairs Stairs: Yes Stairs assistance: Contact guard assist Stair Management: No rails, Alternating pattern, Forwards Number of Stairs: 10 General stair comments: stable, VSS  Wheelchair Mobility     Tilt Bed    Modified Rankin (Stroke Patients Only) Modified Rankin (Stroke Patients Only) Pre-Morbid Rankin Score: No symptoms Modified Rankin: Moderate disability     Balance Overall balance assessment: Mild deficits observed, not formally tested                               Standardized Balance Assessment Standardized Balance Assessment : Dynamic Gait Index   Dynamic Gait Index Level Surface: Normal Change in Gait Speed: Normal Gait with Horizontal Head Turns: Normal Gait with Vertical Head Turns: Normal Gait and Pivot Turn: Normal Step Over Obstacle: Mild Impairment Step Around Obstacles: Normal Steps: Normal Total Score: 23       Pertinent Vitals/Pain      Home Living Family/patient expects to be discharged to:: Private residence Living Arrangements: Spouse/significant other Available Help at Discharge: Family (Simultaneous filing. User may not have seen previous data.) Type of  Home: House Home Access: Level entry       Home Layout: One level Home Equipment: Grab bars - tub/shower      Prior Function Prior Level of Function : Independent/Modified Independent             Mobility Comments: independent, no use of DME ADLs Comments: works in Production designer, theatre/television/film at [employer]; enjoys  working in the yard, has Cytogeneticist.     Extremity/Trunk Assessment   Upper Extremity Assessment Upper Extremity Assessment: Defer to OT evaluation    Lower Extremity Assessment Lower Extremity Assessment: Overall WFL for tasks assessed;LLE deficits/detail (strength grossly 4+/5 to MMT bilaterally, pt denies difference in sensation, coordination grossly intact, minor deficit on L foot but improved to equal with continued reps) LLE Sensation: WNL LLE Coordination: decreased fine motor    Cervical / Trunk Assessment Cervical / Trunk Assessment: Normal  Communication   Communication Communication: No apparent difficulties    Cognition       PT - Cognitive impairments: No apparent impairments                                 Cueing Cueing Techniques: Verbal cues            Assessment/Plan    PT Assessment Patient does not need any further PT services         PT Goals (Current goals can be found in the Care Plan section)  Acute Rehab PT Goals Patient Stated Goal: to return home PT Goal Formulation: All assessment and education complete, DC therapy Time For Goal Achievement: 08/12/23 Potential to Achieve Goals: Good     AM-PAC PT "6 Clicks" Mobility  Outcome Measure Help needed turning from your back to your side while in a flat bed without using bedrails?: None Help needed moving from lying on your back to sitting on the side of a flat bed without using bedrails?: None Help needed moving to and from a bed to a chair (including a wheelchair)?: None Help needed standing up from a chair using your arms (e.g., wheelchair or bedside chair)?: None Help needed to walk in hospital room?: A Little Help needed climbing 3-5 steps with a railing? : A Little 6 Click Score: 22    End of Session Equipment Utilized During Treatment: Gait belt Activity Tolerance: Patient tolerated treatment well Patient left: in chair;with chair alarm set;with call  bell/phone within reach (with OT) Nurse Communication: Mobility status PT Visit Diagnosis: Unsteadiness on feet (R26.81)    Time: 1047-1101 PT Time Calculation (min) (ACUTE ONLY): 14 min   Charges:   PT Evaluation $PT Eval Low Complexity: 1 Low   PT General Charges $$ ACUTE PT VISIT: 1 Visit         Barnabas Booth, PT, DPT   Acute Rehabilitation Department Office [phone] Secure Chat Communication Preferred  Lona Rist 08/05/2023, 11:18 AM

## 2023-08-05 NOTE — Progress Notes (Signed)
 PT Cancellation Note  Patient Details Name: Omar Ward MRN: [id number] DOB: 03-Aug-1970   Cancelled Treatment:    Reason Eval/Treat Not Completed: Active bedrest order remains this morning. Will continue to follow and evaluate as medically appropriate.   Barnabas Booth, PT, DPT   Acute Rehabilitation Department Office [phone] Secure Chat Communication Preferred   Lona Rist 08/05/2023, 8:30 AM

## 2023-08-05 NOTE — Progress Notes (Addendum)
 STROKE TEAM PROGRESS NOTE    SIGNIFICANT HOSPITAL EVENTS 6/6-patient admitted with acute onset dizziness, nausea and vomiting found to have acute right PICA occlusion and given TNK  INTERIM HISTORY/SUBJECTIVE  Patient remains hemodynamically stable and afebrile overnight.  He reports no dizziness or ataxia.    OBJECTIVE  CBC    Component Value Date/Time   WBC 13.7 (H) 08/05/2023 0443   RBC 4.71 08/05/2023 0443   HGB 14.9 08/05/2023 0443   HGB 14.6 01/29/2019 1133   HCT 44.7 08/05/2023 0443   HCT 43.2 01/29/2019 1133   PLT 162 08/05/2023 0443   PLT 152 01/29/2019 1133   MCV 94.9 08/05/2023 0443   MCV 101 (H) 01/29/2019 1133   MCH 31.6 08/05/2023 0443   MCHC 33.3 08/05/2023 0443   RDW 12.9 08/05/2023 0443   RDW 12.5 01/29/2019 1133   LYMPHSABS 3.5 08/04/2023 0910   MONOABS 1.1 (H) 08/04/2023 0910   EOSABS 0.1 08/04/2023 0910   BASOSABS 0.0 08/04/2023 0910    BMET    Component Value Date/Time   NA 135 08/05/2023 0443   NA 140 07/02/2019 1006   K 3.9 08/05/2023 0443   CL 103 08/05/2023 0443   CO2 25 08/05/2023 0443   GLUCOSE 100 (H) 08/05/2023 0443   BUN 11 08/05/2023 0443   BUN 12 07/02/2019 1006   CREATININE 0.86 08/05/2023 0443   CALCIUM  8.5 (L) 08/05/2023 0443   GFRNONAA >60 08/05/2023 0443    IMAGING past 24 hours MR BRAIN WO CONTRAST Result Date: 08/05/2023 CLINICAL DATA:  53 year old male code stroke presentation with right PICA occlusion status post TNK, improved appearance on repeat CTA yesterday. EXAM: MRI HEAD WITHOUT CONTRAST TECHNIQUE: Multiplanar, multiecho pulse sequences of the brain and surrounding structures were obtained without intravenous contrast. COMPARISON:  CT head, CTA yesterday.  Brain MRI 02/05/2022. FINDINGS: Brain: Patchy and confluent 3-4 cm infarct in the right cerebellar hemisphere posteriorly. T2 and FLAIR hyperintensity with no hemorrhagic transformation. No posterior fossa mass effect. Underlying chronic left greater than right  patchy cerebellar infarcts. No brainstem or other restricted diffusion. No midline shift, mass effect, evidence of mass lesion, ventriculomegaly, extra-axial collection or acute intracranial hemorrhage. Cervicomedullary junction and pituitary are within normal limits. Stable and normal for age supratentorial gray and white matter signal. No convincing chronic cerebral blood products on SWI. Vascular: Major intracranial vascular flow voids are stable compared to 2023. Skull and upper cervical spine: Stable, negative. Sinuses/Orbits: Stable and negative orbits. Stable mild paranasal sinus mucosal thickening. Other: Stable trace mastoid effusions. Visible internal auditory structures appear normal. Negative visible scalp and face. IMPRESSION: 1. Patchy 3-4 cm Right cerebellar infarct with no hemorrhagic transformation or mass effect. 2. No other acute intracranial abnormality. Underlying chronic left greater than right cerebellar infarcts. Electronically Signed   By: Marlise Simpers M.D.   On: 08/05/2023 10:11    Vitals:   08/05/23 0924 08/05/23 1000 08/05/23 1100 08/05/23 1200  BP: 108/85 105/77 119/89   Pulse:  70 63   Resp: 16 13 15    Temp:    98.6 F (37 C)  TempSrc:      SpO2:  95% 96%   Weight:      Height:         PHYSICAL EXAM General:  Alert, well-nourished, well-developed patient in no acute distress Psych:  Mood and affect appropriate for situation CV: Regular rate and rhythm on monitor Respiratory:  Regular, unlabored respirations on room air   NEURO:  Mental Status: AA&Ox3, patient  is able to give clear and coherent history Speech/Language: speech is without dysarthria or aphasia.    Cranial Nerves:  II: PERRL. Visual fields full.  III, IV, VI: EOMI. Eyelids elevate symmetrically.  V: Sensation is intact to light touch and symmetrical to face.  VII: Face is symmetrical resting and smiling VIII: hearing intact to voice. IX, X: Phonation is normal.  NF:[id number] shrug 5/5. XII:  tongue is midline without fasciculations. Motor: 5/5 strength to all muscle groups tested.  Tone: is normal and bulk is normal Sensation- Intact to light touch bilaterally. Extinction absent to light touch to DSS.   Coordination: FTN intact bilaterally, HKS: no ataxia in BLE Gait-steady  Most Recent NIH 0   ASSESSMENT/PLAN  Omar Ward is a 53 y.o. male with history of cerebellar stroke in 2023, hypertension, hyperlipidemia, sleep apnea, anxiety, alcohol and tobacco use admitted for acute onset dizziness, nausea and vomiting.  He was given TNK to treat potential stroke, and CT angio revealed right PICA occlusion, not amenable to intervention.  NIH on Admission 2  Stroke:  right cerebellum infarcts with right PICA occlusion status post TNK, etiology: Large vessel disease Code Stroke CT head questionable hyperdense left PCA, chronic cerebellar infarct ASPECTS 10.    CTA head & neck occluded right PICA, chronic left PICA occlusion CT repeat status post TNK no bleeding CTA head and neck status post TNK showed improved patency of right PICA, underlying chronic moderate to severe atherosclerotic irregularity in right PICA MRI patchy moderate size right cerebellar infarct 2D Echo pending LDL 136 HgbA1c 5.9 UDS positive for THC VTE prophylaxis -SCDs No antithrombotic prior to admission, now on aspirin  81 and Plavix  75 DAPT for 3 months and then Plavix  alone. Therapy recommendations: None Disposition: likely home tomorrow  Hx of Stroke/TIA 01/2022 admitted for left PICA infarct.  CT and MRI also showed old right cerebellar infarct.  CTA head and neck showed left PICA occlusion and right PICA tandem stenosis.  EF 60 to 65%.  LDL 87, A1c 5.3, UDS positive for THC.  Discharged on DAPT and Crestor  20.  Hypertension Home meds: Hydrochlorothiazide  25 mg daily, irbesartan  300 mg daily Stable Long-term BP goal normotensive  Hyperlipidemia Home meds: Rosuvastatin  40 mg daily LDL 136, goal  < 70 Crestor  40 resumed Add ezetimibe 10 mg daily, consider Leqvio or PCSK9 inhibitor Continue statin and Zetia at discharge  Tobacco Abuse Patient smokes 0.5 packs per day        Ready to quit? Yes Nicotine  replacement therapy provided  Substance Abuse Patient uses marijuana UDS positive for  THC        Ready to quit? Yes TOC consult for cessation placed  Other Stroke Risk Factors Obesity, Body mass index is 31.01 kg/m., BMI >/= 30 associated with increased stroke risk, recommend weight loss, diet and exercise as appropriate  Obstructive sleep apnea, on CPAP at home Alcohol use, recommend no more than 2 drinks per day  Other Active Problems AKI, creatinine 1.03--1.30--0.86 Leukocytosis WBC 12.6--13.7  Hospital day # 1  Patient seen by NP with MD, MD to edit note as needed. Cortney E Bucky Cardinal , MSN, AGACNP-BC [facility] See Amion for schedule and pager information 08/05/2023 2:04 PM  ATTENDING NOTE: I reviewed above note and agree with the assessment and plan. Pt was seen and examined.   No family at bedside.  Patient is walking with PT and OT in the hallway, no difficulty with gait and neurologically intact.  Patient  had right cerebellar infarct this time due to right PICA occlusion but seem to be recannulized after TNK.  Currently on DAPT and statin, add Zetia.  2D echo pending.  Smoking cessation and THC cessation education provided.  For detailed assessment and plan, please refer to above as I have made changes wherever appropriate.   Consuelo Denmark, MD PhD Stroke Neurology 08/05/2023 6:41 PM  This patient is critically ill due to stroke status post TNK history of stroke and at significant risk of neurological worsening, death form stroke recurrence, hemorrhagic transmission, bleeding from TNK. This patient's care requires constant monitoring of vital signs, hemodynamics, respiratory and cardiac monitoring, review of multiple databases, neurological  assessment, discussion with family, other specialists and medical decision making of high complexity. I spent 40 minutes of neurocritical care time in the care of this patient.      To contact Stroke Continuity provider, please refer to [email or web address]. After hours, contact General Neurology

## 2023-08-05 NOTE — Evaluation (Signed)
 Occupational Therapy Evaluation Patient Details Name: Omar Ward MRN: [id number] DOB: 10/13/1970 Today's Date: 08/05/2023   History of Present Illness   Pt is a 53 y.o. male presenting with dizziness, nausea/vomiting, L weakness, dysarthria. CTH negative for bleed; s/p TNK. MRI showed R PICA Occlusion. MRI 6/7 confirmed 3-4 cm Right cerebellar infarct. 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 Upon evaluation, pt with mild RUE coordination deficits and questionable cognitive impairment when attempting to name animals, but [facility] for other tasks such as orientation, basic problem solving, deductive reasoning. Will follow for one more session, but do not suspect need for follow up OT after discharge.      If plan is discharge home, recommend the following:    (on pt request)     Functional Status Assessment   Patient has had a recent decline in their functional status and demonstrates the ability to make significant improvements in function in a reasonable and predictable amount of time.     Equipment Recommendations   None recommended by OT     Recommendations for Other Services         Precautions/Restrictions   Precautions Precautions: Fall Recall of Precautions/Restrictions: Intact Restrictions Weight Bearing Restrictions Per Provider Order: No     Mobility Bed Mobility Overal bed mobility: Independent                  Transfers Overall transfer level: Needs assistance Equipment used: None Transfers: Sit to/from Stand Sit to Stand: Supervision           General transfer comment: stable, assist for line management only      Balance Overall balance assessment: Mild deficits observed, not formally tested                                         ADL either performed or  assessed with clinical judgement   ADL Overall ADL's : Independent                                             Vision Patient Visual Report: No change from baseline Vision Assessment?: No apparent visual deficits Additional Comments: WFL during screen for pursuits/saccades. Pt reports no acute change but does admit to needing to see an eye doctor with wearing reading glasse     Perception         Praxis         Pertinent Vitals/Pain Pain Assessment Pain Assessment: No/denies pain     Extremity/Trunk Assessment Upper Extremity Assessment Upper Extremity Assessment: Right hand dominant;RUE deficits/detail;Overall WFL for tasks assessed RUE Deficits / Details: mild decr coordination in RUE with finger opposition as compared to L, but once practiced 1x; equal with L. strength WFL   Lower Extremity Assessment Lower Extremity Assessment: Defer to PT evaluation LLE Sensation: WNL LLE Coordination: decreased fine motor   Cervical / Trunk Assessment Cervical / Trunk Assessment: Normal   Communication Communication Communication: No apparent difficulties   Cognition Arousal: Alert Behavior During Therapy: WFL for tasks assessed/performed Cognition: No apparent impairments  OT - Cognition Comments: pt does endorse mild difficulty with naming animals starting with A, B, C, and so on                 Following commands: Intact       Cueing  General Comments   Cueing Techniques: Verbal cues  VSS   Exercises     Shoulder Instructions      Home Living Family/patient expects to be discharged to:: Private residence Living Arrangements: Spouse/significant other Available Help at Discharge: Family Type of Home: House Home Access: Level entry     Home Layout: One level     Bathroom Shower/Tub: Tub/shower unit;Walk-in shower   Bathroom Toilet: Standard     Home Equipment: Grab bars - tub/shower      Lives With:  Spouse    Prior Functioning/Environment Prior Level of Function : Independent/Modified Independent             Mobility Comments: independent, no use of DME ADLs Comments: works in Production designer, theatre/television/film at [employer]; enjoys working in the yard, has Cytogeneticist.    OT Problem List: Decreased strength;Decreased activity tolerance;Impaired balance (sitting and/or standing);Decreased cognition   OT Treatment/Interventions: Self-care/ADL training;Therapeutic exercise;DME and/or AE instruction;Therapeutic activities;Balance training;Patient/family education      OT Goals(Current goals can be found in the care plan section)   Acute Rehab OT Goals Patient Stated Goal: get home and back to work OT Goal Formulation: With patient Time For Goal Achievement: 08/19/23 Potential to Achieve Goals: Good   OT Frequency:  Min 1X/week    Co-evaluation              AM-PAC OT "6 Clicks" Daily Activity     Outcome Measure Help from another person eating meals?: None Help from another person taking care of personal grooming?: None Help from another person toileting, which includes using toliet, bedpan, or urinal?: None Help from another person bathing (including washing, rinsing, drying)?: None Help from another person to put on and taking off regular upper body clothing?: None Help from another person to put on and taking off regular lower body clothing?: None 6 Click Score: 24   End of Session Equipment Utilized During Treatment: Gait belt Nurse Communication: Mobility status  Activity Tolerance: Patient tolerated treatment well Patient left: in chair;with call bell/phone within reach  OT Visit Diagnosis: Unsteadiness on feet (R26.81);Muscle weakness (generalized) (M62.81);Other symptoms and signs involving cognitive function                Time: 1046-1106 OT Time Calculation (min): 20 min Charges:  OT General Charges $OT Visit: 1 Visit OT Evaluation $OT Eval Moderate Complexity: 1  Mod  Karilyn Ouch, OTR/L [facility] Acute Rehabilitation Office: [phone]   Emery Hans 08/05/2023, 11:31 AM

## 2023-08-05 NOTE — Progress Notes (Signed)
 Echocardiogram 2D Echocardiogram has been performed.  Omar Ward 08/05/2023, 5:22 PM

## 2023-08-05 NOTE — Progress Notes (Signed)
 Dr. Murvin Arthurs notified of bradycardic episode into the 30s. Patient woke up to RN's voice. Neuro status unchanged. Awaiting any new orders.

## 2023-08-05 NOTE — Evaluation (Signed)
 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   Past Medical History:  Past Medical History:  Diagnosis Date   Dyslipidemia    Hernia, abdominal    Hypertension    PUD (peptic ulcer disease)    Sleep apnea    CPAP   Past Surgical History:  Past Surgical History:  Procedure Laterality Date   Knee arthrocopy      HPI:  Omar Ward is a 53 y.o. 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 [facility] [employer] [facility] [employer] due to dizziness, nausea/vomiting. CODE STROKE was activated in Triage.   He is s/p TNK.  MRI of the brain was showing patchy 3-4cm right cerebellar infarct without hemorrhagic transformation or mass effect. Patient works fulltime for [employer] and lives with his wife.    Assessment / Plan / Recommendation Clinical Impression  Cognitive/linguistic evaluation and motor speech screen were completed.  Cranial nerve exam was completed and unremarkable. Lingual, labial, facial and jaw range of motion and strength appeared to be adequate.  Facial  sensation appeared to be intact and he did not endorse a difference in sensation between the right and left side of his face.  Speech was clear and easy to understand. He completed the Mini Mental State Exam achieving an overall score of 28/30.  He was fully oriented to person, place, time and situation. He had good immediate and delayed recall of 3 novel words.  Attention to task was adequate.  Language skills appeared to be grossly intact. He was able to name objects, repeat a short sentence, follow a 3 step command, read/comprehend a sentence and write a sentence. Mild issues were noted for design copy.  Informally he was able to provide logical solutions to simple problems and accurately complete the clock drawing task.  Given results of this evaluation ST follow up is not indicated.  If we can be of further assistance please feel free to reconsult.    Procedure:  The Mini Mental State Exam was administered to assess the patient's cognitive/linguistic skills.  Results were as follows:  ORIENTATION:  9/10 REGISTRATION:  3/3 ATTENTION:  5/5 RECALL:   3/3 LANGUAGE:  8/9  TOTAL SCORE:  28/30 Norms:  24/30 and above considered functional   SLP Assessment  SLP Recommendation/Assessment: Patient does not need any further Speech Language Pathology Services     Assistance Recommended at Discharge  None           SLP Evaluation Cognition  Overall Cognitive Status: Within Functional Limits for tasks assessed Arousal/Alertness: Awake/alert Orientation Level: Oriented X4 Safety/Judgment: Appears intact  Comprehension  Auditory Comprehension Overall Auditory Comprehension: Appears within functional limits for tasks assessed Commands: Within Functional Limits Conversation: Complex Reading Comprehension Reading Status: Within funtional limits    Expression Expression Primary Mode of Expression: Verbal Verbal Expression Overall Verbal Expression: Appears within functional limits for  tasks assessed Initiation: No impairment Automatic Speech: Name;Social Response Level of Generative/Spontaneous Verbalization: Conversation Repetition: No impairment Naming: No impairment Pragmatics: No impairment Non-Verbal Means of Communication: Not applicable Written Expression Dominant Hand: Right Written Expression: Within Functional Limits   Oral / Motor  Oral Motor/Sensory Function Overall Oral Motor/Sensory Function: Within functional limits Motor Speech Overall Motor Speech: Appears within functional limits for tasks assessed Phonation: Normal Intelligibility: Intelligible Motor Planning: Within functional limits Motor Speech Errors: Not applicable             Omar Flicker, MA, CCC-SLP Acute Rehab SLP [phone]  Omar Ward 08/05/2023, 10:50 AM

## 2023-08-06 ENCOUNTER — Encounter: Payer: Self-pay | Admitting: Nurse Practitioner

## 2023-08-06 DIAGNOSIS — R29700 NIHSS score 0: Secondary | ICD-10-CM | POA: Diagnosis not present

## 2023-08-06 DIAGNOSIS — I779 Disorder of arteries and arterioles, unspecified: Secondary | ICD-10-CM | POA: Diagnosis not present

## 2023-08-06 DIAGNOSIS — I63541 Cerebral infarction due to unspecified occlusion or stenosis of right cerebellar artery: Secondary | ICD-10-CM | POA: Diagnosis not present

## 2023-08-06 LAB — BASIC METABOLIC PANEL WITH GFR
Anion gap: 8 (ref 5–15)
BUN: 13 mg/dL (ref 6–20)
CO2: 26 mmol/L (ref 22–32)
Calcium: 9.2 mg/dL (ref 8.9–10.3)
Chloride: 104 mmol/L (ref 98–111)
Creatinine, Ser: 0.9 mg/dL (ref 0.61–1.24)
GFR, Estimated: >60 mL/min (ref >60)
Glucose, Bld: 94 mg/dL (ref 70–99)
Potassium: 4 mmol/L (ref 3.5–5.1)
Sodium: 138 mmol/L (ref 135–145)

## 2023-08-06 LAB — CBC
HCT: 46.3 % (ref 39.0–52.0)
Hemoglobin: 15.1 g/dL (ref 13.0–17.0)
MCH: 31.4 pg (ref 26.0–34.0)
MCHC: 32.6 g/dL (ref 30.0–36.0)
MCV: 96.3 fL (ref 80.0–100.0)
Platelets: 153 10*3/uL (ref 150–400)
RBC: 4.81 MIL/uL (ref 4.22–5.81)
RDW: 12.9 % (ref 11.5–15.5)
WBC: 9.3 10*3/uL (ref 4.0–10.5)
nRBC: 0 % (ref 0.0–0.2)

## 2023-08-06 MED ORDER — ASPIRIN 81 MG PO TBEC
81.0000 mg | DELAYED_RELEASE_TABLET | Freq: Every day | ORAL | 2 refills | Status: DC
  Filled 2023-08-28: qty 30, 30d supply, fill #0
  Filled 2023-09-28: qty 30, 30d supply, fill #1
  Filled 2023-10-31 – 2023-12-11 (×2): qty 30, 30d supply, fill #2

## 2023-08-06 MED ORDER — ASPIRIN 81 MG PO TBEC
81.0000 mg | DELAYED_RELEASE_TABLET | Freq: Every day | ORAL | 12 refills | Status: DC
  Filled 2023-08-06: qty 30, 30d supply, fill #0

## 2023-08-06 MED ORDER — EZETIMIBE 10 MG PO TABS
10.0000 mg | ORAL_TABLET | Freq: Every day | ORAL | 2 refills | Status: DC
  Filled 2023-08-06: qty 30, 30d supply, fill #0

## 2023-08-06 MED ORDER — NICOTINE 14 MG/24HR TD PT24: 14.0000 mg | MEDICATED_PATCH | Freq: Every day | TRANSDERMAL | 0 refills | Status: AC

## 2023-08-06 MED ORDER — NICOTINE 14 MG/24HR TD PT24
14.0000 mg | MEDICATED_PATCH | Freq: Every day | TRANSDERMAL | 0 refills | Status: DC
  Filled 2023-08-06: qty 28, 28d supply, fill #0

## 2023-08-06 MED ORDER — EZETIMIBE 10 MG PO TABS
10.0000 mg | ORAL_TABLET | Freq: Every day | ORAL | 3 refills | Status: DC
  Filled 2023-08-28 – 2023-08-31 (×2): qty 30, 30d supply, fill #0
  Filled 2023-09-28: qty 30, 30d supply, fill #1
  Filled 2023-10-31: qty 30, 30d supply, fill #2

## 2023-08-06 MED ORDER — CLOPIDOGREL BISULFATE 75 MG PO TABS
75.0000 mg | ORAL_TABLET | Freq: Every day | ORAL | 3 refills | Status: DC
  Filled 2023-08-28 – 2023-08-31 (×2): qty 30, 30d supply, fill #0
  Filled 2023-09-28: qty 30, 30d supply, fill #1
  Filled 2023-10-31: qty 30, 30d supply, fill #2

## 2023-08-06 MED ORDER — NICOTINE POLACRILEX 2 MG MT GUM
2.0000 mg | CHEWING_GUM | OROMUCOSAL | 0 refills | Status: DC | PRN
  Filled 2023-08-06: qty 110, fill #0

## 2023-08-06 MED ORDER — NICOTINE POLACRILEX 2 MG MT GUM: 2.0000 mg | CHEWING_GUM | OROMUCOSAL | 0 refills | Status: AC | PRN

## 2023-08-06 MED ORDER — CLOPIDOGREL BISULFATE 75 MG PO TABS
75.0000 mg | ORAL_TABLET | Freq: Every day | ORAL | 2 refills | Status: DC
  Filled 2023-08-06: qty 30, 30d supply, fill #0

## 2023-08-06 NOTE — Progress Notes (Signed)
 Nsg Discharge Note  Admit Date:  08/04/2023 Discharge date: 08/06/2023   Francesca Ink to be D/C'd Home per MD order.  AVS completed.  Patient/caregiver able to verbalize understanding.  Discharge Medication: Allergies as of 08/06/2023       Reactions   Norvasc  [amlodipine ] Other (See Comments)   Lower extremity swelling        Medication List     STOP taking these medications    nitroGLYCERIN  0.4 MG SL tablet Commonly known as: NITROSTAT        TAKE these medications    albuterol  108 (90 Base) MCG/ACT inhaler Commonly known as: VENTOLIN  HFA Inhale 2 puffs into the lungs every 6 (six) hours as needed for wheezing.   aspirin  EC 81 MG tablet Take 1 tablet (81 mg total) by mouth daily. Swallow whole. Start taking on: August 07, 2023   buPROPion  300 MG 24 hr tablet Commonly known as: WELLBUTRIN  XL Take 1 tablet (300 mg total) by mouth daily.   clopidogrel  75 MG tablet Commonly known as: PLAVIX  Take 1 tablet (75 mg total) by mouth daily. Start taking on: August 07, 2023   ezetimibe 10 MG tablet Commonly known as: ZETIA Take 1 tablet (10 mg total) by mouth daily. Start taking on: August 07, 2023   hydrochlorothiazide  25 MG tablet Commonly known as: HYDRODIURIL  Take 1 tablet (25 mg total) by mouth daily. What changed: Another medication with the same name was removed. Continue taking this medication, and follow the directions you see here.   irbesartan  300 MG tablet Commonly known as: AVAPRO  Take 1 tablet (300 mg total) by mouth daily. What changed: Another medication with the same name was removed. Continue taking this medication, and follow the directions you see here.   nicotine  14 mg/24hr patch Commonly known as: NICODERM CQ  - dosed in mg/24 hours Place 1 patch (14 mg total) onto the skin daily. Start taking on: August 07, 2023   nicotine  polacrilex 2 MG gum Commonly known as: NICORETTE Take 1 each (2 mg total) by mouth as needed for smoking cessation.   rosuvastatin   40 MG tablet Commonly known as: CRESTOR  Take 1 tablet (40 mg total) by mouth daily.        Discharge Assessment: Vitals:   08/06/23 1109 08/06/23 1210  BP: (!) 130/94 121/81  Pulse: 72 64  Resp: (!) 22 14  Temp:  98.7 F (37.1 C)  SpO2: 97% 97%   Skin clean, dry and intact without evidence of skin break down, no evidence of skin tears noted. IV catheter discontinued intact. Site without signs and symptoms of complications - no redness or edema noted at insertion site, patient denies c/o pain - only slight tenderness at site.  Dressing with slight pressure applied.  D/c Instructions-Education: Discharge instructions given to patient/family with verbalized understanding. D/c education completed with patient/family including follow up instructions, medication list, d/c activities limitations if indicated, with other d/c instructions as indicated by MD - patient able to verbalize understanding, all questions fully answered. Patient instructed to return to ED, call 911, or call MD for any changes in condition.  Patient escorted by walking and D/C home via private auto.  Thamara Leger, Sable Cram, RN 08/06/2023 12:35 PM

## 2023-08-06 NOTE — Discharge Summary (Addendum)
 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   aspirin  EC 81 MG tablet Take 1 tablet (81 mg total) by mouth daily. Swallow whole. Start taking on: August 07, 2023   buPROPion  300 MG 24 hr tablet Commonly known as: WELLBUTRIN  XL Take 1 tablet (300 mg total) by mouth daily.   clopidogrel  75 MG tablet Commonly known as: PLAVIX  Take 1 tablet (75 mg total) by mouth daily. Start taking on: August 07, 2023   ezetimibe 10 MG tablet Commonly known as: ZETIA Take 1 tablet (10 mg total) by mouth daily. Start taking on: August 07, 2023   hydrochlorothiazide  25 MG tablet Commonly known as: HYDRODIURIL  Take 1 tablet (25 mg total) by mouth daily. What changed: Another medication with the same name was removed. Continue taking this medication, and follow the directions you see here.   irbesartan  300 MG tablet Commonly known as: AVAPRO  Take 1 tablet (300 mg total) by mouth daily. What changed: Another medication with the same name was removed. Continue taking this medication, and follow the directions you see  here.   nicotine  14 mg/24hr patch Commonly known as: NICODERM CQ  - dosed in mg/24 hours Place 1 patch (14 mg total) onto the skin daily. Start taking on: August 07, 2023   nicotine  polacrilex 2 MG gum Commonly known as: NICORETTE Take 1 each (2 mg total) by mouth as needed for smoking cessation.   rosuvastatin  40 MG tablet Commonly known as: CRESTOR  Take 1 tablet (40 mg total) by mouth daily.        LABORATORY STUDIES CBC    Component Value Date/Time   WBC 9.3 08/06/2023 0752   RBC 4.81 08/06/2023 0752   HGB 15.1 08/06/2023 0752   HGB 14.6 01/29/2019 1133   HCT 46.3 08/06/2023 0752   HCT 43.2 01/29/2019 1133   PLT 153 08/06/2023 0752   PLT 152 01/29/2019 1133   MCV 96.3 08/06/2023 0752   MCV 101 (H) 01/29/2019 1133   MCH 31.4 08/06/2023 0752   MCHC 32.6 08/06/2023 0752   RDW 12.9 08/06/2023 0752   RDW 12.5 01/29/2019 1133   LYMPHSABS 3.5 08/04/2023 0910   MONOABS 1.1 (H) 08/04/2023 0910   EOSABS 0.1 08/04/2023 0910   BASOSABS 0.0 08/04/2023 0910   CMP    Component Value Date/Time   NA 138 08/06/2023 0752   NA 140 07/02/2019 1006   K 4.0 08/06/2023 0752   CL 104 08/06/2023 0752   CO2 26 08/06/2023 0752  GLUCOSE 94 08/06/2023 0752   BUN 13 08/06/2023 0752   BUN 12 07/02/2019 1006   CREATININE 0.90 08/06/2023 0752   CALCIUM  9.2 08/06/2023 0752   PROT 7.4 08/04/2023 0910   PROT 6.1 07/02/2019 1006   ALBUMIN 4.4 08/04/2023 0910   ALBUMIN 4.1 07/02/2019 1006   AST 22 08/04/2023 0910   ALT 21 08/04/2023 0910   ALKPHOS 74 08/04/2023 0910   BILITOT 0.9 08/04/2023 0910   BILITOT <0.2 07/02/2019 1006   GFRNONAA >60 08/06/2023 0752   GFRAA 116 07/02/2019 1006   COAGS Lab Results  Component Value Date   INR 1.0 08/04/2023   INR 1.0 02/05/2022   INR 0.97 11/08/2014   Lipid Panel    Component Value Date/Time   CHOL 187 08/05/2023 0443   CHOL 255 (H) 10/31/2018 1155   TRIG 118 08/05/2023 0443   HDL 27 (L) 08/05/2023 0443   HDL 43 10/31/2018 1155    CHOLHDL 6.9 08/05/2023 0443   VLDL 24 08/05/2023 0443   LDLCALC 136 (H) 08/05/2023 0443   LDLCALC 150 (H) 10/31/2018 1155   HgbA1C  Lab Results  Component Value Date   HGBA1C 5.9 (H) 08/04/2023   Alcohol Level    Component Value Date/Time   [facility] <15 08/04/2023 1012     SIGNIFICANT DIAGNOSTIC STUDIES ECHOCARDIOGRAM COMPLETE Result Date: 08/05/2023    ECHOCARDIOGRAM REPORT   Patient Name:   Omar Ward Date of Exam: 08/05/2023 Medical Rec #:  [id number]      Height:       69.0 in Accession #:    [id number]     Weight:       210.0 lb Date of Birth:  [birth year]       BSA:          2.109 m Patient Age:    52 years       BP:           119/83 mmHg Patient Gender: M              HR:           56 bpm. Exam Location:  Inpatient Procedure: 2D Echo, Cardiac Doppler and Color Doppler (Both Spectral and Color            Flow Doppler were utilized during procedure). Indications:    Stroke I63.9  History:        Patient has prior history of Echocardiogram examinations, most                 recent 02/05/2022. Stroke, Signs/Symptoms:Chest Pain; Risk                 Factors:Sleep Apnea, Hypertension, Dyslipidemia and Current                 Smoker.  Sonographer:    Terrilee Few RCS Referring Phys: ERIN C LEHNER IMPRESSIONS  1. Left ventricular ejection fraction, by estimation, is 60 to 65%. The left ventricle has normal function. The left ventricle has no regional wall motion abnormalities. Left ventricular diastolic parameters were normal.  2. Right ventricular systolic function is normal. The right ventricular size is normal.  3. Atrial septum is aneursymal no obvious PFO No bubble study done.  4. The mitral valve is normal in structure. No evidence of mitral valve regurgitation. No evidence of mitral stenosis.  5. The aortic valve is tricuspid. Aortic valve regurgitation is not visualized. No aortic stenosis is present.  6. The inferior vena cava  is normal in size with greater than 50% respiratory variability,  suggesting right atrial pressure of 3 mmHg. FINDINGS  Left Ventricle: Left ventricular ejection fraction, by estimation, is 60 to 65%. The left ventricle has normal function. The left ventricle has no regional wall motion abnormalities. Strain was performed and the global longitudinal strain is indeterminate. The left ventricular internal cavity size was normal in size. There is no left ventricular hypertrophy. Left ventricular diastolic parameters were normal. Right Ventricle: The right ventricular size is normal. No increase in right ventricular wall thickness. Right ventricular systolic function is normal. Left Atrium: Left atrial size was normal in size. Right Atrium: Right atrial size was normal in size. Pericardium: There is no evidence of pericardial effusion. Mitral Valve: The mitral valve is normal in structure. No evidence of mitral valve regurgitation. No evidence of mitral valve stenosis. Tricuspid Valve: The tricuspid valve is normal in structure. Tricuspid valve regurgitation is not demonstrated. No evidence of tricuspid stenosis. Aortic Valve: The aortic valve is tricuspid. Aortic valve regurgitation is not visualized. No aortic stenosis is present. Aortic valve peak gradient measures 3.7 mmHg. Pulmonic Valve: The pulmonic valve was normal in structure. Pulmonic valve regurgitation is not visualized. No evidence of pulmonic stenosis. Aorta: The aortic root is normal in size and structure. Venous: The inferior vena cava is normal in size with greater than 50% respiratory variability, suggesting right atrial pressure of 3 mmHg. IAS/Shunts: No atrial level shunt detected by color flow Doppler. Additional Comments: 3D was performed not requiring image post processing on an independent workstation and was indeterminate.  LEFT VENTRICLE PLAX 2D LVIDd:         4.80 cm   Diastology LVIDs:         2.90 cm   LV e' medial:    6.20 cm/s LV PW:         1.10 cm   LV E/e' medial:  8.3 LV IVS:        1.10 cm   LV e'  lateral:   10.90 cm/s LVOT diam:     2.40 cm   LV E/e' lateral: 4.7 LV SV:         65 LV SV Index:   31 LVOT Area:     4.52 cm  RIGHT VENTRICLE             IVC RV S prime:     13.70 cm/s  IVC diam: 1.70 cm TAPSE (M-mode): 2.8 cm LEFT ATRIUM             Index        RIGHT ATRIUM           Index LA diam:        3.60 cm 1.71 cm/m   RA Area:     15.40 cm LA Vol (A2C):   52.3 ml 24.80 ml/m  RA Volume:   41.20 ml  19.53 ml/m LA Vol (A4C):   48.3 ml 22.90 ml/m LA Biplane Vol: 51.6 ml 24.46 ml/m  AORTIC VALVE AV Area (Vmax): 4.00 cm AV Vmax:        96.50 cm/s AV Peak Grad:   3.7 mmHg LVOT Vmax:      85.40 cm/s LVOT Vmean:     55.000 cm/s LVOT VTI:       0.144 m  AORTA Ao Root diam: 3.70 cm Ao Asc diam:  3.40 cm MITRAL VALVE MV Area (PHT): 3.17 cm    SHUNTS MV Decel Time: 239 msec  Systemic VTI:  0.14 m MV E velocity: 51.40 cm/s  Systemic Diam: 2.40 cm MV A velocity: 58.70 cm/s MV E/A ratio:  0.88 Janelle Mediate MD Electronically signed by Janelle Mediate MD Signature Date/Time: 08/05/2023/7:53:54 PM    Final    MR BRAIN WO CONTRAST Result Date: 08/05/2023 CLINICAL DATA:  53 year old male code stroke presentation with right PICA occlusion status post TNK, improved appearance on repeat CTA yesterday. EXAM: MRI HEAD WITHOUT CONTRAST TECHNIQUE: Multiplanar, multiecho pulse sequences of the brain and surrounding structures were obtained without intravenous contrast. COMPARISON:  CT head, CTA yesterday.  Brain MRI 02/05/2022. FINDINGS: Brain: Patchy and confluent 3-4 cm infarct in the right cerebellar hemisphere posteriorly. T2 and FLAIR hyperintensity with no hemorrhagic transformation. No posterior fossa mass effect. Underlying chronic left greater than right patchy cerebellar infarcts. No brainstem or other restricted diffusion. No midline shift, mass effect, evidence of mass lesion, ventriculomegaly, extra-axial collection or acute intracranial hemorrhage. Cervicomedullary junction and pituitary are within normal  limits. Stable and normal for age supratentorial gray and white matter signal. No convincing chronic cerebral blood products on SWI. Vascular: Major intracranial vascular flow voids are stable compared to 2023. Skull and upper cervical spine: Stable, negative. Sinuses/Orbits: Stable and negative orbits. Stable mild paranasal sinus mucosal thickening. Other: Stable trace mastoid effusions. Visible internal auditory structures appear normal. Negative visible scalp and face. IMPRESSION: 1. Patchy 3-4 cm Right cerebellar infarct with no hemorrhagic transformation or mass effect. 2. No other acute intracranial abnormality. Underlying chronic left greater than right cerebellar infarcts. Electronically Signed   By: Marlise Simpers M.D.   On: 08/05/2023 10:11   CT ANGIO HEAD NECK W WO CM (CODE STROKE) Result Date: 08/04/2023 CLINICAL DATA:  53 year old male status post code stroke presentation and TNK this morning for suspected acute right PICA occlusion (superimposed on chronic severe right PICA atherosclerosis and chronic left PICA occlusion. Waxing and waning symptoms. Subsequent encounter. EXAM: CT ANGIOGRAPHY HEAD AND NECK WITH AND WITHOUT CONTRAST TECHNIQUE: Multidetector CT imaging of the head and neck was performed using the standard protocol during bolus administration of intravenous contrast. Multiplanar CT image reconstructions and MIPs were obtained to evaluate the vascular anatomy. Carotid stenosis measurements (when applicable) are obtained utilizing NASCET criteria, using the distal internal carotid diameter as the denominator. RADIATION DOSE REDUCTION: This exam was performed according to the departmental dose-optimization program which includes automated exposure control, adjustment of the mA and/or kV according to patient size and/or use of iterative reconstruction technique. CONTRAST:  75mL OMNIPAQUE  IOHEXOL  350 MG/ML SOLN COMPARISON:  CTA at 0942 hours today. FINDINGS: CTA NECK Skeleton: Stable. Upper chest:  Stable, negative. Other neck: Nonvascular neck soft tissue spaces appears stable, negative; interestingly the right CCA and right carotid bifurcation have a more retropharyngeal course compared to earlier today (series 7, image 120). Aortic arch: Stable 3 vessel arch with mild calcified atherosclerosis. Right carotid system: Stable mild atherosclerosis without stenosis. Left carotid system: Stable mild atherosclerosis without stenosis. Vertebral arteries: Stable. Both vertebral arteries remain patent to the skull base without significant stenosis. The left vertebral is mildly dominant as before. CTA HEAD Posterior circulation: Pronounced improved patency of the right PICA compared to earlier today. See series 12, image 22. Underlying chronic moderate to severe atherosclerotic irregularity in that segment now highly resembles the CTA appearance on 02/05/2022. Contralateral left PICA remains occluded, chronic. Distal vertebral arteries, vertebrobasilar junction, basilar artery, SCA and PCA origins appear stable and patent without significant stenosis. Bilateral PCA branches are stable and  within normal limits. Anterior circulation: Patent, stable since 0942 hours today with no significant stenosis. Venous sinuses: Similar early contrast timing, grossly patent. Anatomic variants: Mildly dominant left vertebral artery. Review of the MIP images confirms the above findings IMPRESSION: 1. Improved patency of the Right PICA from 0942 hours today status post TNK. Underlying chronic moderate to severe atherosclerotic irregularity in that vessel stable from 2023 CTA. 2. No new CTA finding.  Chronic Left PICA occlusion. These results were communicated to Dr. Doretta Gant at 12:38 pm on 08/04/2023. Electronically Signed   By: Marlise Simpers M.D.   On: 08/04/2023 12:39   CT HEAD WO CONTRAST ( ) Result Date: 08/04/2023 CLINICAL DATA:  53 year old male code stroke presentation this morning with evidence of acute right PICA occlusion. Waxing  and waning symptoms following T NK, severe headache and diaphoresis now. EXAM: CT HEAD WITHOUT CONTRAST TECHNIQUE: Contiguous axial images were obtained from the base of the skull through the vertex without intravenous contrast. RADIATION DOSE REDUCTION: This exam was performed according to the departmental dose-optimization program which includes automated exposure control, adjustment of the mA and/or kV according to patient size and/or use of iterative reconstruction technique. COMPARISON:  Head CTs 1120 hours today and earlier. FINDINGS: Brain: Cerebellum and posterior fossa CT appearance is stable from earlier today. No acute intracranial hemorrhage, mass effect, or convincing cytotoxic edema. Chronic left greater than right cerebellar infarcts. No ventriculomegaly. Supratentorial gray-white differentiation also stable, notable for right posterior temporal lobe developmental venous anomaly with associated curvilinear hyperdensity series 2, image 14. Vascular: Less residual intravascular contrast now. No new finding is evident. Skull: Stable and intact. Sinuses/Orbits: Visualized paranasal sinuses and mastoids are stable and well aerated. Other: Stable orbit and scalp soft tissues. IMPRESSION: Continued stable non contrast CT appearance of the brain since code stroke presentation this morning. No acute intracranial hemorrhage, mass effect, or cytotoxic edema identified. Study discussed by telephone with Dr. Greg Leaks on 08/04/2023 at 1216 hours. Electronically Signed   By: Marlise Simpers M.D.   On: 08/04/2023 12:31   CT HEAD WO CONTRAST ( ) Result Date: 08/04/2023 EXAM: CT HEAD WITHOUT CONTRAST 08/04/2023 09:24:00 AM TECHNIQUE: CT of the head was performed without the administration of intravenous contrast. Automated exposure control, iterative reconstruction, and/or weight based adjustment of the mA/kV was utilized to reduce the radiation dose to as low as reasonably achievable. COMPARISON: None available.  CLINICAL HISTORY: Stroke, follow up; s/p TNK now severe headache r/o hemorrhagic conversion. Right PICA occlusion. FINDINGS: BRAIN AND VENTRICLES: There is no acute intracranial hemorrhage, mass effect or midline shift. No abnormal extra-axial fluid collection. The gray-white differentiation is maintained without an acute infarct. There is no hydrocephalus. Mild diffuse white matter changes are stable. Remote lacunar infarcts are again seen in the cerebellum bilaterally. No acute hemorrhage or progressive infarction is present. ORBITS: The visualized portion of the orbits demonstrate no acute abnormality. SINUSES: The visualized paranasal sinuses and mastoid air cells demonstrate no acute abnormality. SOFT TISSUES AND SKULL: No acute abnormality of the visualized skull or soft tissues. IMPRESSION: 1. No acute hemorrhage or progressive infarction. 2. Remote lacunar infarcts in the cerebellum bilaterally. 3. Stable mild diffuse white matter changes. Electronically signed by: Audree Leas MD 08/04/2023 11:47 AM EDT RP Workstation: [id number]   CT ANGIO HEAD NECK W WO CM (CODE STROKE) Result Date: 08/04/2023 CLINICAL DATA:  53 year old male code stroke presentation with worsening symptoms. EXAM: CT ANGIOGRAPHY HEAD AND NECK WITH AND WITHOUT CONTRAST TECHNIQUE: Multidetector CT imaging of the head  and neck was performed using the standard protocol during bolus administration of intravenous contrast. Multiplanar CT image reconstructions and MIPs were obtained to evaluate the vascular anatomy. Carotid stenosis measurements (when applicable) are obtained utilizing NASCET criteria, using the distal internal carotid diameter as the denominator. RADIATION DOSE REDUCTION: This exam was performed according to the departmental dose-optimization program which includes automated exposure control, adjustment of the mA and/or kV according to patient size and/or use of iterative reconstruction technique. CONTRAST:  75 mL  Omnipaque  350 COMPARISON:  Plain head CT today. Prior CTA head and neck 02/05/2022. FINDINGS: CTA NECK Skeleton: No acute osseous abnormality. Mild for age cervical spine degeneration. Occasional dental caries. Upper chest: Negative. Other neck: Nonvascular neck soft tissue spaces appear stable and within normal limits. Aortic arch: 3 vessel arch.  Calcified aortic atherosclerosis. Right carotid system: Patent with mild for age atherosclerosis, no significant stenosis. Left carotid system: Patent with mild atherosclerosis, no significant stenosis. Vertebral arteries: Proximal right subclavian artery plaque without stenosis. Right vertebral artery origin remains within normal limits. Mildly non dominant right vertebral artery appears patent to the skull base, stable since 2023 without focal stenosis. No significant proximal left subclavian or left vertebral artery origin plaque or stenosis. Left vertebral artery remains somewhat dominant, patent to the skull base with no significant plaque or stenosis. CTA HEAD Posterior circulation: Distal vertebral arteries, vertebrobasilar junction, basilar artery appear stable since 2023, patent without significant stenosis. SCA and PCA origins are patent. Posterior communicating arteries are diminutive or absent. Bilateral PCA branches are patent and stable, up to mild associated bilateral PCA atherosclerotic irregularity. Chronically occluded left PICA and the right PICA now appears occluded in its mid to distal segments although the right PICA origin remains patent on series 6, image 185. No poor enhancement of the right PICA now on series 12, image 25 and compare to Severe stenoses in some of the same segments on series 16, image 21 of the 2023 CTA. Preliminary report of the above discussed by telephone with Dr. Doretta Gant on 08/04/2023 at 09:48 . Anterior circulation: Both ICA siphons are patent. Supraclinoid right ICA calcified plaque but no significant ICA siphon stenosis. Patent  carotid termini. MCA and ACA origins are patent without stenosis. Diminutive or absent anterior communicating artery. Bilateral ACA branches are stable and within normal limits. Bilateral MCA branches are stable and within normal limits. Venous sinuses: Early contrast timing, superior sagittal sinus is patent. Anatomic variants: Mildly dominant left vertebral artery. Review of the MIP images confirms the above findings IMPRESSION: 1. Positive for Occluded Right PICA distal to its origin, new since 2023 CTA, although severe atherosclerotic stenosis in the occluded segments at that time. 2. No superimposed large vessel occlusion; chronically occluded left PICA. No other hemodynamically significant atherosclerotic stenosis in the head or neck. 3.  Aortic Atherosclerosis (ICD10-I70.0). Salient findings discussed by telephone with Dr. Doretta Gant on 08/04/2023 at 09:48 . Electronically Signed   By: Marlise Simpers M.D.   On: 08/04/2023 09:58   CT HEAD CODE STROKE WO CONTRAST Result Date: 08/04/2023 CLINICAL DATA:  Code stroke. 53 year old male with headache and blurred vision. EXAM: CT HEAD WITHOUT CONTRAST TECHNIQUE: Contiguous axial images were obtained from the base of the skull through the vertex without intravenous contrast. RADIATION DOSE REDUCTION: This exam was performed according to the departmental dose-optimization program which includes automated exposure control, adjustment of the mA and/or kV according to patient size and/or use of iterative reconstruction technique. COMPARISON:  Brain MRI, Head CT 02/05/2022. FINDINGS:  Brain: Chronic left greater than right cerebellar infarcts with expected evolution since 02/05/2022. 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 No midline shift, ventriculomegaly, mass effect, evidence of mass lesion, intracranial hemorrhage or evidence of cortically based acute infarction. Supratentorial gray-white  differentiation appears stable since 2023 and normal for age. Vascular: Questionable hyperdense left PCA on coronal image 38. Skull: Stable and intact. Sinuses/Orbits: Visualized paranasal sinuses and mastoids are stable and well aerated. Other: No gaze deviation. Stable orbit and scalp soft tissues including punctate retained metallic foreign body overlying the left frontal sinus outside the skull series 5, image 41. ASPECTS [place] Day Stroke Program Early CT Score) Total score (0-10 with 10 being normal): 10 IMPRESSION: 1. Questionable hyperdense Left PCA. No acute cortically based infarct or acute intracranial hemorrhage identified. ASPECTS 10. 2. Chronic cerebellar infarcts. 3. These results were communicated to Dr. Doretta Gant at 9:34 am on 08/04/2023 by text page via the [facility] messaging system. Electronically Signed   By: Marlise Simpers M.D.   On: 08/04/2023 09:34       HISTORY OF PRESENT ILLNESS 53 y.o. patient with history of [place] stroke in 2023, hypertension, hyperlipidemia, sleep apnea, anxiety, alcohol and tobacco use was admitted with acute onset dizziness, nausea and vomiting.  HOSPITAL COURSE Patient was given TNK to treat potential stroke and was found to have right PICA occlusion on CT angiogram, which was not amenable to intervention.  Patient's symptoms improved post TNK, and repeat CT angiogram showed improved patency of right PICA.  MRI revealed patchy moderate size right cerebellar infarct.  Patient is now stable to be discharged home and will need to take aspirin  and Plavix  for 3 months followed by Plavix  alone.  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    CTA head & neck occluded right PICA, chronic left PICA occlusion CT repeat status post TNK no bleeding CTA head and neck status post TNK showed improved patency of right PICA,  underlying chronic moderate to severe atherosclerotic irregularity in right PICA MRI patchy moderate size right cerebellar infarct 2D Echo EF 60 to 65%, normal left atrial size, no atrial level shunt LDL 136 HgbA1c 5.9 UDS positive for THC VTE prophylaxis -SCDs No antithrombotic prior to admission, now on aspirin  81 and Plavix  75 DAPT for 3 months and then Plavix  alone. Therapy recommendations: None Disposition: Home   Hx of Stroke/TIA 01/2022 admitted for left PICA infarct.  CT and MRI also showed old right cerebellar infarct.  CTA head and neck showed left PICA occlusion and right PICA tandem stenosis.  EF 60 to 65%.  LDL 87, A1c 5.3, UDS positive for THC.  Discharged on DAPT and Crestor  20.   Hypertension Home meds: Hydrochlorothiazide  25 mg daily, irbesartan  300 mg daily Stable Long-term BP goal normotensive   Hyperlipidemia Home meds: Rosuvastatin  40 mg daily LDL 136, goal < 70 Crestor  40 resumed Add ezetimibe 10 mg daily, consider Leqvio or PCSK9 inhibitor Continue statin and Zetia at discharge   Tobacco Abuse Patient smokes 0.5 packs per day        Ready to quit? Yes Nicotine  replacement therapy provided   Substance Abuse Patient uses marijuana UDS positive for  THC        Ready to quit? Yes TOC consult for cessation placed   Other Stroke Risk Factors Obesity, Body  mass index is 31.01 kg/m., BMI >/= 30 associated with increased stroke risk, recommend weight loss, diet and exercise as appropriate  Obstructive sleep apnea, on CPAP at home Alcohol use, recommend no more than 2 drinks per day   Other Active Problems AKI, creatinine 1.03--1.30--0.86--0.90 Leukocytosis WBC 12.6--13.7--9.3   DISCHARGE EXAM  PHYSICAL EXAM General:  Alert, well-nourished, well-developed patient in no acute distress Psych:  Mood and affect appropriate for situation CV: Regular rate and rhythm on monitor Respiratory:  Regular, unlabored respirations on room air  NEURO:  Mental  Status: AA&Ox3  Speech/Language: speech is without dysarthria or aphasia.    Cranial Nerves:  II: PERRL. Visual fields full.  III, IV, VI: EOMI. Eyelids elevate symmetrically.  V: Sensation is intact to light touch and symmetrical to face.  VII: Smile is symmetrical.  VIII: hearing intact to voice. IX, X: Phonation is normal.  WG:[id number] shrug 5/5. XII: tongue is midline without fasciculations. Motor: 5/5 strength to all muscle groups tested.  Tone: is normal and bulk is normal Sensation- Intact to light touch bilaterally. Extinction absent to light touch to DSS. Coordination: FTN intact bilaterally Gait- deferred  1a Level of Conscious.: 0 1b LOC Questions: 0 1c LOC Commands: 0 2 Best Gaze: 0 3 Visual: 0 4 Facial Palsy: 0 5a Motor Arm - left: 0 5b Motor Arm - Right: 0 6a Motor Leg - Left: 0 6b Motor Leg - Right: 0 7 Limb Ataxia: 0 8 Sensory: 0 9 Best Language: 0 10 Dysarthria: 0 11 Extinct. and Inatten.: 0 TOTAL: 0   Discharge Diet       Diet   Diet Heart Fluid consistency: Thin   liquids  DISCHARGE PLAN Disposition: Home aspirin  81 mg daily and clopidogrel  75 mg daily for secondary stroke prevention for 3 months then clopidogrel  75 mg daily alone. Ongoing stroke risk factor control by Primary Care Physician at time of discharge Follow-up PCP Frutoso Jing, PA-C in 2 weeks. Follow-up in [facility] in 4-6 weeks, office to schedule an appointment.   35 minutes were spent preparing discharge.  Cortney E Bucky Cardinal , MSN, AGACNP-BC [facility] See Amion for schedule and pager information 08/06/2023 11:59 AM  ATTENDING NOTE: I reviewed above note and agree with the assessment and plan. Pt was seen and examined.   No acute event overnight. Pt declined the [place] trial. Medically stable for discharge, continue DAPT for 3 months and continue statin. Quit smoking and THC, education provided. Pt will follow up  with [facility] in 4-6 weeks.  For detailed assessment and plan, please refer to above as I have made changes wherever appropriate.   Consuelo Denmark, MD PhD Stroke Neurology 08/06/2023 1:50 PM

## 2023-08-06 NOTE — Plan of Care (Signed)
   Problem: Ischemic Stroke/TIA Tissue Perfusion: Goal: Complications of ischemic stroke/TIA will be minimized Outcome: Completed/Met

## 2023-08-06 NOTE — Discharge Instructions (Addendum)
 Omar Ward, you came to the hospital with sudden onset dizziness and nausea.  You were found to be having a stroke and were given TNK to treat this.  You will need to take aspirin  and Plavix  daily for three months and be seen in the stroke clinic in 8 weeks.

## 2023-08-07 ENCOUNTER — Other Ambulatory Visit (HOSPITAL_COMMUNITY): Payer: Self-pay

## 2023-08-08 ENCOUNTER — Other Ambulatory Visit: Payer: Self-pay | Admitting: Neurology

## 2023-08-08 DIAGNOSIS — E7800 Pure hypercholesterolemia, unspecified: Secondary | ICD-10-CM | POA: Insufficient documentation

## 2023-08-24 ENCOUNTER — Telehealth: Payer: Self-pay

## 2023-08-24 NOTE — Telephone Encounter (Signed)
 Auth Submission: APPROVED Site of care: Site of care: [facility] Payer: [employer] commercial Medication & CPT/J Code(s) submitted: Leqvio (Inclisiran) [id number] Diagnosis Code:  Route of submission (phone, fax, portal): portal Phone # Fax # Auth type: Buy/Bill PB Units/visits requested: 284mg  x 3 doses Reference number: [id number] Approval from: 08/09/23 to 08/08/24

## 2023-08-28 ENCOUNTER — Encounter: Payer: Self-pay | Admitting: Neurology

## 2023-08-28 ENCOUNTER — Other Ambulatory Visit: Payer: Self-pay

## 2023-08-28 ENCOUNTER — Other Ambulatory Visit (HOSPITAL_COMMUNITY): Payer: Self-pay

## 2023-08-28 MED ORDER — IRBESARTAN 300 MG PO TABS
300.0000 mg | ORAL_TABLET | Freq: Every day | ORAL | 11 refills | Status: AC
  Filled 2023-08-28: qty 30, 30d supply, fill #0
  Filled 2023-09-28: qty 30, 30d supply, fill #1
  Filled 2023-10-31: qty 30, 30d supply, fill #2
  Filled 2023-12-11: qty 30, 30d supply, fill #3

## 2023-08-28 MED ORDER — HYDROCHLOROTHIAZIDE 25 MG PO TABS
25.0000 mg | ORAL_TABLET | Freq: Every day | ORAL | 11 refills | Status: DC
  Filled 2023-08-28: qty 30, 30d supply, fill #0
  Filled 2023-09-28: qty 30, 30d supply, fill #1
  Filled 2023-10-31: qty 30, 30d supply, fill #2
  Filled 2023-12-11: qty 30, 30d supply, fill #3

## 2023-08-28 MED ORDER — BUPROPION HCL ER (XL) 300 MG PO TB24
300.0000 mg | ORAL_TABLET | Freq: Every day | ORAL | 11 refills | Status: DC
  Filled 2023-08-28: qty 30, 30d supply, fill #0
  Filled 2023-09-28: qty 30, 30d supply, fill #1
  Filled 2023-10-31: qty 30, 30d supply, fill #2
  Filled 2023-12-11: qty 30, 30d supply, fill #3
  Filled 2024-01-15: qty 30, 30d supply, fill #4

## 2023-08-29 ENCOUNTER — Other Ambulatory Visit (HOSPITAL_COMMUNITY): Payer: Self-pay

## 2023-08-31 ENCOUNTER — Other Ambulatory Visit (HOSPITAL_COMMUNITY): Payer: Self-pay

## 2023-09-04 DIAGNOSIS — Z13220 Encounter for screening for lipoid disorders: Secondary | ICD-10-CM | POA: Diagnosis not present

## 2023-09-04 DIAGNOSIS — Z0000 Encounter for general adult medical examination without abnormal findings: Secondary | ICD-10-CM | POA: Diagnosis not present

## 2023-09-28 ENCOUNTER — Other Ambulatory Visit (HOSPITAL_COMMUNITY): Payer: Self-pay

## 2023-10-02 ENCOUNTER — Telehealth: Payer: Self-pay | Admitting: Family Medicine

## 2023-10-02 NOTE — Telephone Encounter (Signed)
 [name] calling to Follow up prior authorization status for Leqvio. Would like a call back.

## 2023-10-02 NOTE — Telephone Encounter (Signed)
 Patient has not been seen here in 3 years. I don't see the name of that medication listed on his medication list .  No medication were prescribed at this office at either visit.   I believe they have wrong office.

## 2023-10-31 ENCOUNTER — Other Ambulatory Visit (HOSPITAL_COMMUNITY): Payer: Self-pay

## 2023-11-14 ENCOUNTER — Telehealth: Payer: Self-pay

## 2023-11-14 NOTE — Patient Outreach (Signed)
 First telephone outreach attempt to obtain mRS. No answer. Left message for returned call.  [facility]  Population Health Care Management Assistant  Direct Dial: [phone]  Fax: [phone] Website: [email or web address]

## 2023-11-15 ENCOUNTER — Telehealth: Payer: Self-pay

## 2023-11-15 NOTE — Patient Outreach (Signed)
 Second telephone outreach attempt to obtain mRS. No answer. Left message for returned call.  Shereen [facility]  Population Health Care Management Assistant  Direct Dial: [phone]  Fax: [phone] Website: [email or web address]

## 2023-11-16 ENCOUNTER — Telehealth: Payer: Self-pay

## 2023-11-16 NOTE — Patient Outreach (Signed)
 3 outreach attempts were completed to obtain mRs. mRs could not be obtained because patient never returned my calls. mRs=7    Shereen Gin [facility] Health Care Management Assistant  Direct Dial: [phone]  Fax: [phone] Website: [email or web address]

## 2023-12-11 ENCOUNTER — Other Ambulatory Visit (HOSPITAL_COMMUNITY): Payer: Self-pay

## 2023-12-13 ENCOUNTER — Other Ambulatory Visit (HOSPITAL_COMMUNITY): Payer: Self-pay

## 2023-12-13 MED ORDER — CLOPIDOGREL BISULFATE 75 MG PO TABS
75.0000 mg | ORAL_TABLET | Freq: Every day | ORAL | 3 refills | Status: AC
  Filled 2023-12-13: qty 90, 90d supply, fill #0
  Filled 2024-01-15: qty 90, 90d supply, fill #1

## 2023-12-18 ENCOUNTER — Other Ambulatory Visit (HOSPITAL_COMMUNITY): Payer: Self-pay

## 2023-12-19 ENCOUNTER — Other Ambulatory Visit (HOSPITAL_COMMUNITY): Payer: Self-pay

## 2023-12-19 MED ORDER — EZETIMIBE 10 MG PO TABS
10.0000 mg | ORAL_TABLET | Freq: Every day | ORAL | 3 refills | Status: DC
  Filled 2023-12-19: qty 90, 90d supply, fill #0
  Filled 2024-01-15: qty 90, 90d supply, fill #1

## 2023-12-25 ENCOUNTER — Other Ambulatory Visit (HOSPITAL_COMMUNITY): Payer: Self-pay

## 2023-12-25 DIAGNOSIS — M25512 Pain in left shoulder: Secondary | ICD-10-CM | POA: Diagnosis not present

## 2023-12-25 DIAGNOSIS — I2510 Atherosclerotic heart disease of native coronary artery without angina pectoris: Secondary | ICD-10-CM | POA: Diagnosis not present

## 2023-12-25 DIAGNOSIS — G8929 Other chronic pain: Secondary | ICD-10-CM | POA: Diagnosis not present

## 2023-12-25 DIAGNOSIS — Z13220 Encounter for screening for lipoid disorders: Secondary | ICD-10-CM | POA: Diagnosis not present

## 2023-12-25 MED ORDER — ROSUVASTATIN CALCIUM 40 MG PO TABS
20.0000 mg | ORAL_TABLET | Freq: Every day | ORAL | 11 refills | Status: AC
  Filled 2024-01-15: qty 30, 60d supply, fill #0

## 2023-12-25 MED ORDER — IRBESARTAN 300 MG PO TABS
150.0000 mg | ORAL_TABLET | Freq: Every day | ORAL | 11 refills | Status: AC
  Filled 2023-12-25 – 2024-01-15 (×2): qty 30, 60d supply, fill #0

## 2024-01-15 ENCOUNTER — Other Ambulatory Visit: Payer: Self-pay

## 2024-01-15 ENCOUNTER — Other Ambulatory Visit (HOSPITAL_COMMUNITY): Payer: Self-pay

## 2024-01-15 DIAGNOSIS — I2510 Atherosclerotic heart disease of native coronary artery without angina pectoris: Secondary | ICD-10-CM | POA: Diagnosis not present

## 2024-01-15 DIAGNOSIS — S29011A Strain of muscle and tendon of front wall of thorax, initial encounter: Secondary | ICD-10-CM | POA: Diagnosis not present

## 2024-01-15 DIAGNOSIS — I10 Essential (primary) hypertension: Secondary | ICD-10-CM | POA: Diagnosis not present

## 2024-01-15 DIAGNOSIS — E7800 Pure hypercholesterolemia, unspecified: Secondary | ICD-10-CM | POA: Diagnosis not present

## 2024-01-15 DIAGNOSIS — Z13220 Encounter for screening for lipoid disorders: Secondary | ICD-10-CM | POA: Diagnosis not present

## 2024-01-15 MED ORDER — PREDNISONE 20 MG PO TABS
40.0000 mg | ORAL_TABLET | Freq: Every day | ORAL | 0 refills | Status: DC
  Filled 2024-01-15: qty 14, 7d supply, fill #0

## 2024-01-15 MED ORDER — TIZANIDINE HCL 4 MG PO TABS
4.0000 mg | ORAL_TABLET | Freq: Three times a day (TID) | ORAL | 0 refills | Status: DC | PRN
  Filled 2024-01-15: qty 42, 14d supply, fill #0

## 2024-01-16 ENCOUNTER — Other Ambulatory Visit (HOSPITAL_COMMUNITY): Payer: Self-pay

## 2024-01-16 MED ORDER — ROSUVASTATIN CALCIUM 20 MG PO TABS
20.0000 mg | ORAL_TABLET | Freq: Every day | ORAL | 1 refills | Status: AC
  Filled 2024-01-16: qty 90, 90d supply, fill #0

## 2024-01-18 ENCOUNTER — Other Ambulatory Visit (HOSPITAL_COMMUNITY): Payer: Self-pay

## 2024-01-19 ENCOUNTER — Other Ambulatory Visit (HOSPITAL_COMMUNITY): Payer: Self-pay

## 2024-01-26 ENCOUNTER — Other Ambulatory Visit (HOSPITAL_COMMUNITY): Payer: Self-pay

## 2024-02-26 ENCOUNTER — Other Ambulatory Visit (HOSPITAL_COMMUNITY): Payer: Self-pay

## 2024-02-26 ENCOUNTER — Other Ambulatory Visit: Payer: Self-pay | Admitting: Internal Medicine

## 2024-02-26 MED ORDER — ASPIRIN 81 MG PO TBEC
81.0000 mg | DELAYED_RELEASE_TABLET | Freq: Every day | ORAL | 1 refills | Status: AC
  Filled 2024-02-26: qty 90, 90d supply, fill #0

## 2024-02-26 MED ORDER — EZETIMIBE 10 MG PO TABS
10.0000 mg | ORAL_TABLET | Freq: Every day | ORAL | 3 refills | Status: DC
  Filled 2024-02-26: qty 90, 90d supply, fill #0

## 2024-02-26 MED ORDER — BUPROPION HCL ER (XL) 300 MG PO TB24
300.0000 mg | ORAL_TABLET | Freq: Every day | ORAL | 11 refills | Status: AC
  Filled 2024-02-26 – 2024-03-28 (×2): qty 30, 30d supply, fill #0

## 2024-02-26 NOTE — Telephone Encounter (Signed)
 NOT [facility] PATIENT

## 2024-02-28 ENCOUNTER — Other Ambulatory Visit (HOSPITAL_COMMUNITY): Payer: Self-pay

## 2024-03-06 ENCOUNTER — Other Ambulatory Visit (HOSPITAL_COMMUNITY): Payer: Self-pay

## 2024-03-06 ENCOUNTER — Encounter (HOSPITAL_COMMUNITY): Payer: Self-pay

## 2024-03-06 MED ORDER — ROSUVASTATIN CALCIUM 20 MG PO TABS
20.0000 mg | ORAL_TABLET | Freq: Every day | ORAL | 1 refills | Status: AC
  Filled 2024-03-06: qty 90, 90d supply, fill #0

## 2024-03-06 MED ORDER — TADALAFIL 10 MG PO TABS
10.0000 mg | ORAL_TABLET | Freq: Every day | ORAL | 0 refills | Status: DC | PRN
  Filled 2024-03-06: qty 10, 60d supply, fill #0

## 2024-03-06 MED ORDER — PREDNISONE 20 MG PO TABS
40.0000 mg | ORAL_TABLET | Freq: Every day | ORAL | 0 refills | Status: AC
  Filled 2024-03-06: qty 14, 7d supply, fill #0

## 2024-03-07 ENCOUNTER — Other Ambulatory Visit (HOSPITAL_COMMUNITY): Payer: Self-pay

## 2024-03-28 ENCOUNTER — Other Ambulatory Visit (HOSPITAL_COMMUNITY): Payer: Self-pay

## 2024-03-28 MED ORDER — TADALAFIL 10 MG PO TABS
10.0000 mg | ORAL_TABLET | Freq: Every day | ORAL | 5 refills | Status: AC
  Filled 2024-04-01: qty 10, 10d supply, fill #0

## 2024-03-29 ENCOUNTER — Other Ambulatory Visit (HOSPITAL_COMMUNITY): Payer: Self-pay

## 2024-04-01 ENCOUNTER — Other Ambulatory Visit (HOSPITAL_COMMUNITY): Payer: Self-pay
